# Patient Record
Sex: Female | Born: 1965 | Race: Black or African American | Hispanic: No | Marital: Single | Smoking: Former smoker
Health system: Southern US, Community
[De-identification: ages and names within clinical notes are randomized; demographics above are authoritative.]

## PROBLEM LIST (undated history)

## (undated) DIAGNOSIS — F32A Depression, unspecified: Secondary | ICD-10-CM

## (undated) DIAGNOSIS — D649 Anemia, unspecified: Secondary | ICD-10-CM

## (undated) DIAGNOSIS — L309 Dermatitis, unspecified: Secondary | ICD-10-CM

## (undated) DIAGNOSIS — I639 Cerebral infarction, unspecified: Secondary | ICD-10-CM

## (undated) DIAGNOSIS — F329 Major depressive disorder, single episode, unspecified: Secondary | ICD-10-CM

## (undated) DIAGNOSIS — I10 Essential (primary) hypertension: Secondary | ICD-10-CM

## (undated) DIAGNOSIS — L409 Psoriasis, unspecified: Secondary | ICD-10-CM

## (undated) DIAGNOSIS — K219 Gastro-esophageal reflux disease without esophagitis: Secondary | ICD-10-CM

## (undated) DIAGNOSIS — E785 Hyperlipidemia, unspecified: Secondary | ICD-10-CM

## (undated) HISTORY — DX: Anemia, unspecified: D64.9

## (undated) HISTORY — PX: CERVICAL CERCLAGE: SHX1329

## (undated) HISTORY — DX: Hyperlipidemia, unspecified: E78.5

## (undated) HISTORY — DX: Major depressive disorder, single episode, unspecified: F32.9

## (undated) HISTORY — DX: Depression, unspecified: F32.A

## (undated) HISTORY — DX: Cerebral infarction, unspecified: I63.9

## (undated) HISTORY — DX: Psoriasis, unspecified: L40.9

## (undated) HISTORY — DX: Dermatitis, unspecified: L30.9

## (undated) HISTORY — DX: Gastro-esophageal reflux disease without esophagitis: K21.9

## (undated) HISTORY — DX: Essential (primary) hypertension: I10

---

## 1998-03-21 ENCOUNTER — Emergency Department (HOSPITAL_COMMUNITY): Admission: EM | Admit: 1998-03-21 | Discharge: 1998-03-21 | Payer: Self-pay | Admitting: Emergency Medicine

## 1998-05-10 ENCOUNTER — Emergency Department (HOSPITAL_COMMUNITY): Admission: EM | Admit: 1998-05-10 | Discharge: 1998-05-10 | Payer: Self-pay | Admitting: Emergency Medicine

## 1998-05-11 ENCOUNTER — Encounter: Payer: Self-pay | Admitting: Emergency Medicine

## 1998-05-11 ENCOUNTER — Ambulatory Visit (HOSPITAL_COMMUNITY): Admission: RE | Admit: 1998-05-11 | Discharge: 1998-05-11 | Payer: Self-pay | Admitting: Emergency Medicine

## 1998-08-10 ENCOUNTER — Other Ambulatory Visit: Admission: RE | Admit: 1998-08-10 | Discharge: 1998-08-10 | Payer: Self-pay | Admitting: Obstetrics

## 1998-12-28 ENCOUNTER — Emergency Department (HOSPITAL_COMMUNITY): Admission: EM | Admit: 1998-12-28 | Discharge: 1998-12-28 | Payer: Self-pay | Admitting: Emergency Medicine

## 2006-05-22 ENCOUNTER — Encounter: Admission: RE | Admit: 2006-05-22 | Discharge: 2006-05-22 | Payer: Self-pay | Admitting: Obstetrics and Gynecology

## 2007-01-21 ENCOUNTER — Ambulatory Visit: Payer: Self-pay | Admitting: Family Medicine

## 2007-01-21 LAB — CONVERTED CEMR LAB
Bilirubin Urine: NEGATIVE
Glucose, Urine, Semiquant: NEGATIVE
Nitrite: NEGATIVE
Specific Gravity, Urine: 1.02
Urobilinogen, UA: 0.2

## 2007-02-05 ENCOUNTER — Ambulatory Visit: Payer: Self-pay | Admitting: Family Medicine

## 2007-02-05 ENCOUNTER — Encounter: Payer: Self-pay | Admitting: Family Medicine

## 2007-02-05 LAB — CONVERTED CEMR LAB
AST: 14 units/L (ref 0–37)
Albumin: 3.8 g/dL (ref 3.5–5.2)
Basophils Relative: 1 % (ref 0–1)
Chloride: 103 meq/L (ref 96–112)
Creatinine, Ser: 0.63 mg/dL (ref 0.40–1.20)
Eosinophils Relative: 1 % (ref 0–5)
HDL: 48 mg/dL (ref >39)
Hemoglobin: 9.2 g/dL — ABNORMAL LOW (ref 12.0–15.0)
LDL Cholesterol: 100 mg/dL — ABNORMAL HIGH (ref 0–99)
Lymphocytes Relative: 38 % (ref 12–46)
MCV: 77.2 fL — ABNORMAL LOW (ref 78.0–100.0)
Monocytes Absolute: 0.5 10*3/uL (ref 0.1–1.0)
Monocytes Relative: 7 % (ref 3–12)
Neutro Abs: 3.3 10*3/uL (ref 1.7–7.7)
Neutrophils Relative %: 53 % (ref 43–77)
Sodium: 135 meq/L (ref 135–145)
Total Bilirubin: 0.2 mg/dL — ABNORMAL LOW (ref 0.3–1.2)
Total CHOL/HDL Ratio: 3.3

## 2007-02-11 ENCOUNTER — Ambulatory Visit: Payer: Self-pay | Admitting: Family Medicine

## 2007-02-11 DIAGNOSIS — E663 Overweight: Secondary | ICD-10-CM | POA: Insufficient documentation

## 2007-02-11 DIAGNOSIS — D509 Iron deficiency anemia, unspecified: Secondary | ICD-10-CM | POA: Insufficient documentation

## 2007-11-18 ENCOUNTER — Ambulatory Visit (HOSPITAL_COMMUNITY): Admission: RE | Admit: 2007-11-18 | Discharge: 2007-11-18 | Payer: Self-pay | Admitting: Obstetrics and Gynecology

## 2007-11-18 ENCOUNTER — Encounter (HOSPITAL_COMMUNITY): Payer: Self-pay | Admitting: Obstetrics and Gynecology

## 2008-02-02 ENCOUNTER — Encounter: Admission: RE | Admit: 2008-02-02 | Discharge: 2008-02-02 | Payer: Self-pay | Admitting: Obstetrics and Gynecology

## 2008-06-14 ENCOUNTER — Encounter: Payer: Self-pay | Admitting: Family Medicine

## 2008-06-16 ENCOUNTER — Encounter: Payer: Self-pay | Admitting: Family Medicine

## 2008-07-01 ENCOUNTER — Encounter: Payer: Self-pay | Admitting: Family Medicine

## 2008-07-01 ENCOUNTER — Ambulatory Visit: Payer: Self-pay | Admitting: Family Medicine

## 2008-07-01 LAB — CONVERTED CEMR LAB
Nitrite: POSITIVE
Protein, U semiquant: 100
pH: 5.5

## 2008-07-06 LAB — CONVERTED CEMR LAB
Basophils Absolute: 0 10*3/uL (ref 0.0–0.1)
Basophils Relative: 0 % (ref 0–1)
Eosinophils Absolute: 0.2 10*3/uL (ref 0.0–0.7)
HCT: 31.5 % — ABNORMAL LOW (ref 36.0–46.0)
Hemoglobin: 9.8 g/dL — ABNORMAL LOW (ref 12.0–15.0)
Lymphocytes Relative: 25 % (ref 12–46)
Lymphs Abs: 2.6 10*3/uL (ref 0.7–4.0)
MCHC: 31.1 g/dL (ref 30.0–36.0)
Neutrophils Relative %: 65 % (ref 43–77)
RBC: 3.79 M/uL — ABNORMAL LOW (ref 3.87–5.11)
WBC: 10.5 10*3/uL (ref 4.0–10.5)

## 2008-07-22 ENCOUNTER — Emergency Department (HOSPITAL_COMMUNITY): Admission: EM | Admit: 2008-07-22 | Discharge: 2008-07-22 | Payer: Self-pay | Admitting: Emergency Medicine

## 2008-08-24 ENCOUNTER — Telehealth: Payer: Self-pay | Admitting: Family Medicine

## 2008-08-30 ENCOUNTER — Ambulatory Visit: Payer: Self-pay | Admitting: Family Medicine

## 2008-08-30 ENCOUNTER — Encounter: Payer: Self-pay | Admitting: Family Medicine

## 2008-08-30 DIAGNOSIS — F4323 Adjustment disorder with mixed anxiety and depressed mood: Secondary | ICD-10-CM | POA: Insufficient documentation

## 2008-09-21 ENCOUNTER — Telehealth: Payer: Self-pay | Admitting: Family Medicine

## 2008-09-30 ENCOUNTER — Ambulatory Visit: Payer: Self-pay | Admitting: Family Medicine

## 2008-10-05 ENCOUNTER — Encounter: Payer: Self-pay | Admitting: Family Medicine

## 2008-10-05 DIAGNOSIS — F331 Major depressive disorder, recurrent, moderate: Secondary | ICD-10-CM

## 2008-10-07 ENCOUNTER — Encounter: Payer: Self-pay | Admitting: Family Medicine

## 2008-10-13 ENCOUNTER — Ambulatory Visit: Payer: Self-pay | Admitting: Family Medicine

## 2008-11-10 ENCOUNTER — Ambulatory Visit: Payer: Self-pay | Admitting: Family Medicine

## 2009-02-07 ENCOUNTER — Encounter: Payer: Self-pay | Admitting: Family Medicine

## 2009-02-07 ENCOUNTER — Ambulatory Visit: Payer: Self-pay | Admitting: Family Medicine

## 2009-02-07 LAB — CONVERTED CEMR LAB

## 2009-02-08 ENCOUNTER — Telehealth (INDEPENDENT_AMBULATORY_CARE_PROVIDER_SITE_OTHER): Payer: Self-pay | Admitting: *Deleted

## 2009-02-09 ENCOUNTER — Encounter (INDEPENDENT_AMBULATORY_CARE_PROVIDER_SITE_OTHER): Payer: Self-pay | Admitting: *Deleted

## 2009-02-09 ENCOUNTER — Encounter: Payer: Self-pay | Admitting: *Deleted

## 2009-02-14 ENCOUNTER — Encounter (INDEPENDENT_AMBULATORY_CARE_PROVIDER_SITE_OTHER): Payer: Self-pay | Admitting: *Deleted

## 2009-02-14 DIAGNOSIS — F17200 Nicotine dependence, unspecified, uncomplicated: Secondary | ICD-10-CM | POA: Insufficient documentation

## 2009-03-25 DIAGNOSIS — I639 Cerebral infarction, unspecified: Secondary | ICD-10-CM

## 2009-03-25 HISTORY — DX: Cerebral infarction, unspecified: I63.9

## 2009-05-09 ENCOUNTER — Encounter: Payer: Self-pay | Admitting: Family Medicine

## 2009-05-09 ENCOUNTER — Ambulatory Visit: Payer: Self-pay | Admitting: Family Medicine

## 2009-05-09 DIAGNOSIS — F39 Unspecified mood [affective] disorder: Secondary | ICD-10-CM | POA: Insufficient documentation

## 2009-06-14 ENCOUNTER — Inpatient Hospital Stay (HOSPITAL_COMMUNITY): Admission: EM | Admit: 2009-06-14 | Discharge: 2009-06-16 | Payer: Self-pay | Admitting: Emergency Medicine

## 2009-06-15 ENCOUNTER — Encounter (INDEPENDENT_AMBULATORY_CARE_PROVIDER_SITE_OTHER): Payer: Self-pay | Admitting: Diagnostic Neuroimaging

## 2009-06-19 ENCOUNTER — Telehealth (INDEPENDENT_AMBULATORY_CARE_PROVIDER_SITE_OTHER): Payer: Self-pay | Admitting: Family Medicine

## 2009-06-26 ENCOUNTER — Encounter: Admission: RE | Admit: 2009-06-26 | Discharge: 2009-07-07 | Payer: Self-pay | Admitting: Neurology

## 2009-07-06 ENCOUNTER — Ambulatory Visit: Payer: Self-pay | Admitting: Family Medicine

## 2009-07-06 DIAGNOSIS — Z8673 Personal history of transient ischemic attack (TIA), and cerebral infarction without residual deficits: Secondary | ICD-10-CM | POA: Insufficient documentation

## 2009-08-16 ENCOUNTER — Encounter: Admission: RE | Admit: 2009-08-16 | Discharge: 2009-11-14 | Payer: Self-pay | Admitting: Neurology

## 2009-11-16 ENCOUNTER — Encounter: Payer: Self-pay | Admitting: *Deleted

## 2009-11-16 ENCOUNTER — Ambulatory Visit: Payer: Self-pay | Admitting: Family Medicine

## 2010-02-16 ENCOUNTER — Encounter: Admission: RE | Admit: 2010-02-16 | Discharge: 2010-02-16 | Payer: Self-pay | Admitting: Obstetrics and Gynecology

## 2010-02-21 ENCOUNTER — Emergency Department (HOSPITAL_COMMUNITY)
Admission: EM | Admit: 2010-02-21 | Discharge: 2010-02-21 | Payer: Self-pay | Source: Home / Self Care | Admitting: Occupational Therapy

## 2010-04-15 ENCOUNTER — Encounter (HOSPITAL_COMMUNITY): Payer: Self-pay | Admitting: Obstetrics and Gynecology

## 2010-04-24 NOTE — Letter (Signed)
Summary: Generic Letter  [facility]  [address]   [place], [place] [address]   Phone: [phone]  Fax: [phone]    11/16/2009  [facility] [address] [place], [place]  [address]   To Whom This May Concern:       The above named patient presented to our office today to receive immunizations that are required for the school she will be attending. However, she is currently taking a medication that is contraindicated in receiving MMR. We have ask her to discuss this with  the specialist   that ordered this medication regarding the safety of her reciving MMR. She has an appointment with this specialist November 28, 2009.            Sincerely,   Theresia Lo RN

## 2010-04-24 NOTE — Assessment & Plan Note (Signed)
Summary: hospital f/u   Vital Signs:  Patient profile:   45 year old female Height:      64 inches Weight:      151.8 pounds BMI:     26.15 Temp:     98.4 degrees F oral Pulse rate:   85 / minute BP sitting:   132 / 82  (left arm) Cuff size:   regular  Vitals Entered By: Gladstone Pih (July 06, 2009 2:48 PM) CC: [facility]U Hospital Is Patient Diabetic? Yes Did you bring your meter with you today? No   Primary Care Provider:  Marisue Ivan, MD  CC:  [facility].  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  States that she has f/u with Dr. Pearlean Brownie on 5/31.  Denies any further symptoms but still has some weakness on the right side.    Habits & Providers  Alcohol-Tobacco-Diet     Tobacco Status: never  Current Medications (verified): 1)  Enbrel 50 Mg/ml Soln (Etanercept) 2)  Remeron 15 Mg Tabs (Mirtazapine) .... One Tablet By Mouth Daily 3)  Simvastatin 20 Mg Tabs (Simvastatin) .Marland Kitchen.. 1 Tab By Mouth At Bedtime 4)  Bayer Aspirin 325 Mg Tabs (Aspirin) .Marland Kitchen.. 1 Tab By Mouth Daily  Allergies (verified): No Known Drug Allergies  Social History: Smoking Status:  never  Review of Systems      See HPI  Physical Exam  General:  VS Reviewed. Well appearing, NAD.  Head:  no facial droop Eyes:  EOMI PERRLA Neck:  supple, full ROM, no goiter or mass  Lungs:  Normal respiratory effort, chest expands symmetrically. Lungs are clear to auscultation, no crackles or wheezes. Heart:  Normal rate and regular rhythm. S1 and S2 normal without gallop, murmur, click, rub or other extra sounds. Neurologic:  5/5 strength in upper extremities 4/5 strength in plantar flexion on right compared to left sensation grossly intact 2+ DTRs nl speech nl gait   Impression & Recommendations:  Problem # 1:  CVA (ICD-434.91) Assessment  Improved  s/p L medial thalamic infarct seen on MRI Minimal neuro deficits focal to R plantar flexion  Risk stratification completed during hospital stay Agree with statin and ASA Pt has stopped smoking Will have her return to the clinic for nurse visit to reassess her bp  Her updated medication list for this problem includes:    Bayer Aspirin 325 Mg Tabs (Aspirin) .Marland Kitchen... 1 tab by mouth daily  Orders: [facility]- Est Level  3 (03474)  Complete Medication List: 1)  Enbrel 50 Mg/ml Soln (Etanercept) 2)  Remeron 15 Mg Tabs (Mirtazapine) .... One tablet by mouth daily 3)  Simvastatin 20 Mg Tabs (Simvastatin) .Marland Kitchen.. 1 tab by mouth at bedtime 4)  Bayer Aspirin 325 Mg Tabs (Aspirin) .Marland Kitchen.. 1 tab by mouth daily  Patient Instructions: 1)  I want you to return at your convenience for a nurse visit to check and record your blood pressure.   2)  No changes to your medication at this time.

## 2010-04-24 NOTE — Assessment & Plan Note (Signed)
Summary: shots for college,tcb  Nurse Visit patient in office today to receive immunizations for school. 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  after going over screening questionaire it was determined that patient is currently taking Enbrel. Since this medication suppresses the immune system it is recommended that she not receive MMR today until she has discussed with her dermatologist about the degree of immune suppression. I contacted the [facility] on call nurse and this is the information I was given.  also consulted with Dr. Mauricio Po. Tdap given and letter given to patient to take to her school regarding MMR .  Theresia Lo RN  November 16, 2009 2:38 PM    Vital Signs:  Patient profile:   44 year old female Temp:     98.8 degrees F  Vitals Entered By: Theresia Lo RN (November 16, 2009 2:33 PM)  Allergies: No Known Drug Allergies  Orders Added: 1)  Admin 1st Vaccine [facility]) [90471S]  Appended Document: shots for college,tcb   Tetanus/Td Vaccine    Vaccine Type: Tdap (State)    Site: right deltoid    Mfr: GlaxoSmithKline    Dose: 0.5 ml    Route: IM    Given by: Theresia Lo RN    Exp. Date: 12/10/2010    Lot #: [id number]    VIS given: 02/10/07 version given November 16, 2009.

## 2010-04-24 NOTE — Progress Notes (Signed)
Summary: phn msg  Phone Note Call from Patient Call back at [phone]   Caller: Patient Summary of Call: had a stroke last week and wants to know if he could fill out disability forms?  pls call call  Initial call taken by: De Nurse,  June 19, 2009 10:02 AM  Follow-up for Phone Call        Plan to see her for f/u appt and she can bring the appropriate forms.  I'll be glad to review the forms at that time. Follow-up by: Marisue Ivan  MD,  June 26, 2009 5:48 PM  Additional Follow-up for Phone Call Additional follow up Details #1::        LVM about making f/up appt with Dr. Burnadette Pop and he will fill out papers at that time. Additional Follow-up by: Clydell Hakim,  June 26, 2009 6:07 PM

## 2010-04-24 NOTE — Assessment & Plan Note (Signed)
Summary: Mood instability- psych referral   Vital Signs:  Patient profile:   45 year old female Height:      64 inches Weight:      143.5 pounds BMI:     24.72 Temp:     98.0 degrees F oral Pulse rate:   76 / minute BP sitting:   135 / 84  (left arm) Cuff size:   regular  Vitals Entered By: Gladstone Pih (May 09, 2009 11:08 AM) CC: f/u on depression/anxiety Is Patient Diabetic? No Pain Assessment Patient in pain? no        Primary Care Provider:  Marisue Ivan, MD  CC:  f/u on depression/anxiety.  History of Present Illness: 45yo F here for f/u depression /anxiety  Anxiety/Depression: Pt states she is no longer responding to the medication as she had been in 10/2008.  States that she is more depressed and apathetic now.  Has lost weight.  States that her mood is not stable.  She has moments of spontaneous crying and then will be elated at times.  Denies any shopping sprees or staying up for extended periods of time.  No family hx of bipolar disorder.  Pt has many stressors in life with family, work, and finances.  No SI/HI.  Although she had a plan weeks ago to sit in a running car in an enclosed garage but did not go through with it.  Habits & Providers  Alcohol-Tobacco-Diet     Tobacco Status: current     Tobacco Counseling: to quit use of tobacco products     Cigarette Packs/Day: 0.5  Current Medications (verified): 1)  Ferrous Sulfate 325 (65 Fe) Mg  Tbec (Ferrous Sulfate) .... One Tablet By Mouth Twice Daily 2)  Enbrel 50 Mg/ml Soln (Etanercept) 3)  Remeron 15 Mg Tabs (Mirtazapine) .... One Tablet By Mouth Daily  Allergies (verified): No Known Drug Allergies  Social History: Packs/Day:  0.5  Review of Systems       no fevers, headaches, dizziness, cp, sob  Physical Exam  General:  VS Reviewed. Well appearing, NAD.  Psych:  Tearful, unstable mood. Not anxious appearing. Good eye contact. Appropriate affect. No SI/HI.   Impression &  Recommendations:  Problem # 1:  MOOD SWINGS (ICD-296.99) Assessment New Today's evaluation is more c/w mood instability than typical anxiety/depression.  She has no family hx of Bipoloar disorder and has never been dx or treated for mood instability.   She has been treated with Celexa, Fluoxetine, and currently on Remeron which she initially responded well to during Aug 2010. Plan to keep her on Remeron at this time. She is not suicidal or homicidal and has no active plan or intentions. I want to refer her to Dr. Emerson Monte [phone]) for further evaluation and recommendations. If she can't be seen by Dr. Nolen Mu within 4 weeks, I will want her to see me in the office in 2 weeks to reassess. Plan to call the patient per Gladstone Pih this afternoon after calling Dr. Loralie Champagne office. Best contact # for [place] is [phone] (cell). Face to face time was >40 minutes.  Orders: Psychiatric Referral (Psych) [facility]- Est  Level 4 ([address]147)  Complete Medication List: 1)  Ferrous Sulfate 325 (65 Fe) Mg Tbec (Ferrous sulfate) .... One tablet by mouth twice daily 2)  Enbrel 50 Mg/ml Soln (Etanercept) 3)  Remeron 15 Mg Tabs (Mirtazapine) .... One tablet by mouth daily  Other Orders: [facility] ([id number])  Patient Instructions: 1)  We will call you  with the appt with Dr. Nolen Mu. 2)  Continue with the Remeron.  3)  I'll talk to you about seeing me earlier if you can't get in with Dr. Nolen Mu. Prescriptions: REMERON 15 MG TABS (MIRTAZAPINE) one tablet by mouth daily  #30 x 2   Entered and Authorized by:   Marisue Ivan  MD   Signed by:   Marisue Ivan  MD on 05/09/2009   Method used:   Electronically to        [facility] [address] [phone][facility] (retail)       [address]       [place], [place]  [address]       Ph: [phone] or [phone]       Fax: [phone]   RxID:   [id number]

## 2010-05-04 ENCOUNTER — Encounter: Payer: Self-pay | Admitting: *Deleted

## 2010-05-04 DIAGNOSIS — F331 Major depressive disorder, recurrent, moderate: Secondary | ICD-10-CM

## 2010-05-17 DIAGNOSIS — F411 Generalized anxiety disorder: Secondary | ICD-10-CM

## 2010-05-17 DIAGNOSIS — F331 Major depressive disorder, recurrent, moderate: Secondary | ICD-10-CM

## 2010-05-30 DIAGNOSIS — F4323 Adjustment disorder with mixed anxiety and depressed mood: Secondary | ICD-10-CM

## 2010-05-30 DIAGNOSIS — F39 Unspecified mood [affective] disorder: Secondary | ICD-10-CM

## 2010-06-05 LAB — BASIC METABOLIC PANEL
Calcium: 8.5 mg/dL (ref 8.4–10.5)
Chloride: 110 mEq/L (ref 96–112)
Creatinine, Ser: 0.55 mg/dL (ref 0.4–1.2)
GFR calc Af Amer: >60 mL/min (ref >60)
GFR calc non Af Amer: >60 mL/min (ref >60)
Potassium: 3.6 mEq/L (ref 3.5–5.1)

## 2010-06-05 LAB — DIFFERENTIAL
Eosinophils Absolute: 0.2 10*3/uL (ref 0.0–0.7)
Eosinophils Relative: 3 % (ref 0–5)
Lymphocytes Relative: 36 % (ref 12–46)
Lymphs Abs: 2.4 10*3/uL (ref 0.7–4.0)
Monocytes Absolute: 0.6 10*3/uL (ref 0.1–1.0)
Monocytes Relative: 10 % (ref 3–12)
Neutro Abs: 3.5 10*3/uL (ref 1.7–7.7)
Neutrophils Relative %: 52 % (ref 43–77)

## 2010-06-05 LAB — POCT CARDIAC MARKERS

## 2010-06-05 LAB — CBC
MCH: 26.2 pg (ref 26.0–34.0)
RBC: 3.78 MIL/uL — ABNORMAL LOW (ref 3.87–5.11)

## 2010-06-18 LAB — CBC
MCHC: 33.1 g/dL (ref 30.0–36.0)
MCV: 82.8 fL (ref 78.0–100.0)
Platelets: 190 10*3/uL (ref 150–400)
RBC: 3.67 MIL/uL — ABNORMAL LOW (ref 3.87–5.11)
RDW: 14.6 % (ref 11.5–15.5)

## 2010-06-18 LAB — POCT I-STAT, CHEM 8
Calcium, Ion: 1.05 mmol/L — ABNORMAL LOW (ref 1.12–1.32)
HCT: 33 % — ABNORMAL LOW (ref 36.0–46.0)
Hemoglobin: 11.2 g/dL — ABNORMAL LOW (ref 12.0–15.0)
Potassium: 3.5 mEq/L (ref 3.5–5.1)
Sodium: 138 mEq/L (ref 135–145)
TCO2: 24 mmol/L (ref 0–100)

## 2010-06-18 LAB — HOMOCYSTEINE: Homocysteine: 7.3 umol/L (ref 4.0–15.4)

## 2010-06-18 LAB — GLUCOSE, CAPILLARY
Glucose-Capillary: 109 mg/dL — ABNORMAL HIGH (ref 70–99)
Glucose-Capillary: 114 mg/dL — ABNORMAL HIGH (ref 70–99)
Glucose-Capillary: 75 mg/dL (ref 70–99)
Glucose-Capillary: 85 mg/dL (ref 70–99)
Glucose-Capillary: 92 mg/dL (ref 70–99)

## 2010-06-18 LAB — FACTOR 5 LEIDEN

## 2010-06-18 LAB — PROTIME-INR
INR: 1.03 (ref 0.00–1.49)
Prothrombin Time: 13.4 seconds (ref 11.6–15.2)

## 2010-06-18 LAB — POCT CARDIAC MARKERS
CKMB, poc: <1 ng/mL — ABNORMAL LOW (ref 1.0–8.0)
Myoglobin, poc: 31.2 ng/mL (ref 12–200)

## 2010-06-18 LAB — DIFFERENTIAL
Basophils Absolute: 0 10*3/uL (ref 0.0–0.1)
Basophils Relative: 1 % (ref 0–1)
Eosinophils Absolute: 0.2 10*3/uL (ref 0.0–0.7)
Monocytes Relative: 10 % (ref 3–12)
Neutro Abs: 3.3 10*3/uL (ref 1.7–7.7)
Neutrophils Relative %: 49 % (ref 43–77)

## 2010-06-18 LAB — HEMOGLOBIN A1C: Mean Plasma Glucose: 111 mg/dL

## 2010-06-18 LAB — LIPID PANEL: VLDL: 28 mg/dL (ref 0–40)

## 2010-06-18 LAB — COMPREHENSIVE METABOLIC PANEL
ALT: 15 U/L (ref 0–35)
CO2: 23 mEq/L (ref 19–32)
Calcium: 7.9 mg/dL — ABNORMAL LOW (ref 8.4–10.5)
Chloride: 107 mEq/L (ref 96–112)
GFR calc non Af Amer: >60 mL/min (ref >60)
Glucose, Bld: 158 mg/dL — ABNORMAL HIGH (ref 70–99)
Sodium: 134 mEq/L — ABNORMAL LOW (ref 135–145)
Total Bilirubin: 0.3 mg/dL (ref 0.3–1.2)

## 2010-06-18 LAB — CARDIOLIPIN ANTIBODIES, IGG, IGM, IGA
Anticardiolipin IgA: 0 APL U/mL — ABNORMAL LOW (ref <22)
Anticardiolipin IgG: 6 GPL U/mL — ABNORMAL LOW (ref <23)

## 2010-06-18 LAB — PROTEIN C, TOTAL: Protein C, Total: 95 % (ref 70–140)

## 2010-06-18 LAB — LACTIC ACID, PLASMA: Lactic Acid, Venous: 2.2 mmol/L (ref 0.5–2.2)

## 2010-06-18 LAB — RAPID URINE DRUG SCREEN, HOSP PERFORMED
Amphetamines: NOT DETECTED
Barbiturates: NOT DETECTED
Benzodiazepines: NOT DETECTED

## 2010-06-18 LAB — PROTEIN S ACTIVITY: Protein S Activity: 68 % — ABNORMAL LOW (ref 69–129)

## 2010-06-18 LAB — HEPATIC FUNCTION PANEL
AST: 20 U/L (ref 0–37)
Albumin: 3 g/dL — ABNORMAL LOW (ref 3.5–5.2)
Total Protein: 6.4 g/dL (ref 6.0–8.3)

## 2010-06-18 LAB — PROTHROMBIN GENE MUTATION

## 2010-06-18 LAB — APTT: aPTT: 29 seconds (ref 24–37)

## 2010-06-18 LAB — PROTEIN C ACTIVITY: Protein C Activity: 133 % (ref 75–133)

## 2010-06-18 LAB — LUPUS ANTICOAGULANT PANEL
DRVVT: 42 secs (ref 36.2–44.3)
Lupus Anticoagulant: NOT DETECTED
PTT Lupus Anticoagulant: 41.8 secs (ref 32.0–43.4)

## 2010-06-18 LAB — CK TOTAL AND CKMB (NOT AT ARMC)
CK, MB: 0.5 ng/mL (ref 0.3–4.0)
Total CK: 59 U/L (ref 7–177)

## 2010-06-18 LAB — BETA-2-GLYCOPROTEIN I ABS, IGG/M/A: Beta-2 Glyco I IgG: 1 G Units (ref <20)

## 2010-06-21 DIAGNOSIS — F411 Generalized anxiety disorder: Secondary | ICD-10-CM

## 2010-06-21 DIAGNOSIS — F341 Dysthymic disorder: Secondary | ICD-10-CM

## 2010-07-23 ENCOUNTER — Ambulatory Visit: Payer: Self-pay | Admitting: Family Medicine

## 2010-08-07 NOTE — Op Note (Signed)
NAME:  Ashley Lewis, Ashley Lewis            ACCOUNT NO.:  [id number]   MEDICAL RECORD NO.:  [id number]          PATIENT TYPE:  AMB   LOCATION:  SDC                           FACILITY:  [facility]   PHYSICIAN:  Zelphia Cairo, MD    DATE OF BIRTH:  09-20-1965   DATE OF PROCEDURE:  DATE OF DISCHARGE:                               OPERATIVE REPORT   PREOPERATIVE DIAGNOSES:  1. Dysmenorrhea and menorrhagia.  2. Endometrial mass.  3. Fibroids.   POSTOPERATIVE DIAGNOSES:  1. Dysmenorrhea and menorrhagia.  2. Endometrial mass.  3. Fibroids, path pending.   PROCEDURES:  Hysteroscopy, D and C.   SURGEON:  Zelphia Cairo, MD   ANESTHESIA:  General.   FINDINGS:  Multiple polypoid masses in the posterior uterine wall.   SPECIMEN:  Endometrial curettings with polyps to pathology.   ESTIMATED BLOOD LOSS:  Minimal.   COMPLICATIONS:  None.   CONDITION:  Stable to recovery room.   PROCEDURE:  The patient was taken to the operating room where a general  anesthesia was obtained.  She was placed in the dorsal lithotomy  position using Allen stirrups.  She was prepped and draped in a sterile  fashion and a catheter was used to sterilely drain her bladder for  approximately 50 mL of clear urine.  Bivalve speculum was placed in the  vagina and a single-tooth tenaculum on the anterior lip of the cervix.  The cervix was serially dilated using Pratt dilators.  Hysteroscope was  then inserted and survey of the uterine cavity was performed.  Bilateral  ostia were identified and found to be normal.  There were multiple  fluffy polypoid masses of the posterior uterus.  The remainder of the  uterus appeared smooth and atrophic.  Hysteroscope was removed and a  gentle curetting was then performed.  Hysteroscope was reinserted.  There was still small polypoid mass on the left posterior wall.  A  repeat  curetting was performed.  Specimens were placed on Telfa and sent off to  pathology.  Hysteroscope was  reinserted.  No further polypoid masses  were noted.  The patient was extubated and taken to the recovery room in  stable condition.  Sponge, lap, needle, and instrument counts were  correct times 2.      Zelphia Cairo, MD  Electronically Signed     GA/MEDQ  D:  11/18/2007  T:  11/19/2007  Job:  [id number]

## 2010-08-17 DIAGNOSIS — F330 Major depressive disorder, recurrent, mild: Secondary | ICD-10-CM

## 2010-08-17 DIAGNOSIS — F411 Generalized anxiety disorder: Secondary | ICD-10-CM

## 2010-09-13 ENCOUNTER — Other Ambulatory Visit: Payer: Self-pay | Admitting: Dermatology

## 2010-09-18 DIAGNOSIS — F331 Major depressive disorder, recurrent, moderate: Secondary | ICD-10-CM

## 2010-09-18 DIAGNOSIS — F411 Generalized anxiety disorder: Secondary | ICD-10-CM

## 2010-10-05 ENCOUNTER — Other Ambulatory Visit (HOSPITAL_COMMUNITY)
Admission: RE | Admit: 2010-10-05 | Discharge: 2010-10-05 | Disposition: A | Discharge status: Home / Self Care | Payer: Managed Care, Other (non HMO) | Source: Ambulatory Visit | Attending: Family Medicine | Admitting: Family Medicine

## 2010-10-05 ENCOUNTER — Other Ambulatory Visit: Payer: Self-pay | Admitting: Family Medicine

## 2010-10-05 DIAGNOSIS — Z124 Encounter for screening for malignant neoplasm of cervix: Secondary | ICD-10-CM | POA: Insufficient documentation

## 2010-10-05 DIAGNOSIS — Z113 Encounter for screening for infections with a predominantly sexual mode of transmission: Secondary | ICD-10-CM | POA: Insufficient documentation

## 2011-04-26 ENCOUNTER — Ambulatory Visit: Payer: Managed Care, Other (non HMO) | Admitting: Family Medicine

## 2011-06-04 ENCOUNTER — Other Ambulatory Visit: Payer: Self-pay | Admitting: Obstetrics and Gynecology

## 2011-06-26 ENCOUNTER — Encounter (HOSPITAL_COMMUNITY): Payer: Self-pay | Admitting: Pharmacist

## 2011-07-09 ENCOUNTER — Inpatient Hospital Stay (HOSPITAL_COMMUNITY): Admission: RE | Admit: 2011-07-09 | Payer: Managed Care, Other (non HMO) | Source: Ambulatory Visit

## 2011-09-17 ENCOUNTER — Encounter (HOSPITAL_COMMUNITY): Admission: RE | Payer: Self-pay | Source: Ambulatory Visit

## 2011-09-17 ENCOUNTER — Ambulatory Visit (HOSPITAL_COMMUNITY)
Admission: RE | Admit: 2011-09-17 | Payer: Managed Care, Other (non HMO) | Source: Ambulatory Visit | Admitting: Obstetrics and Gynecology

## 2011-09-17 SURGERY — HYSTERECTOMY, VAGINAL, LAPAROSCOPY-ASSISTED
Anesthesia: General

## 2012-08-12 ENCOUNTER — Ambulatory Visit (INDEPENDENT_AMBULATORY_CARE_PROVIDER_SITE_OTHER): Payer: Medicaid Other | Admitting: Family Medicine

## 2012-08-12 ENCOUNTER — Encounter: Payer: Self-pay | Admitting: Family Medicine

## 2012-08-12 ENCOUNTER — Other Ambulatory Visit (HOSPITAL_COMMUNITY)
Admission: RE | Admit: 2012-08-12 | Discharge: 2012-08-12 | Disposition: A | Discharge status: Home / Self Care | Payer: Medicaid Other | Source: Ambulatory Visit | Attending: Family Medicine | Admitting: Family Medicine

## 2012-08-12 VITALS — BP 150/100 | HR 89 | Temp 99.1°F | Ht 64.0 in | Wt 168.0 lb

## 2012-08-12 DIAGNOSIS — D509 Iron deficiency anemia, unspecified: Secondary | ICD-10-CM

## 2012-08-12 DIAGNOSIS — F331 Major depressive disorder, recurrent, moderate: Secondary | ICD-10-CM

## 2012-08-12 DIAGNOSIS — N898 Other specified noninflammatory disorders of vagina: Secondary | ICD-10-CM

## 2012-08-12 DIAGNOSIS — I6350 Cerebral infarction due to unspecified occlusion or stenosis of unspecified cerebral artery: Secondary | ICD-10-CM

## 2012-08-12 DIAGNOSIS — Z9189 Other specified personal risk factors, not elsewhere classified: Secondary | ICD-10-CM

## 2012-08-12 DIAGNOSIS — Z202 Contact with and (suspected) exposure to infections with a predominantly sexual mode of transmission: Secondary | ICD-10-CM

## 2012-08-12 DIAGNOSIS — L408 Other psoriasis: Secondary | ICD-10-CM

## 2012-08-12 DIAGNOSIS — N912 Amenorrhea, unspecified: Secondary | ICD-10-CM | POA: Insufficient documentation

## 2012-08-12 DIAGNOSIS — M7989 Other specified soft tissue disorders: Secondary | ICD-10-CM

## 2012-08-12 DIAGNOSIS — Z113 Encounter for screening for infections with a predominantly sexual mode of transmission: Secondary | ICD-10-CM | POA: Insufficient documentation

## 2012-08-12 DIAGNOSIS — I10 Essential (primary) hypertension: Secondary | ICD-10-CM

## 2012-08-12 DIAGNOSIS — F482 Pseudobulbar affect: Secondary | ICD-10-CM

## 2012-08-12 HISTORY — DX: Amenorrhea, unspecified: N91.2

## 2012-08-12 LAB — COMPREHENSIVE METABOLIC PANEL
ALT: 15 U/L (ref 0–35)
AST: 18 U/L (ref 0–37)
Albumin: 4.1 g/dL (ref 3.5–5.2)
Alkaline Phosphatase: 51 U/L (ref 39–117)
Chloride: 104 mEq/L (ref 96–112)
Potassium: 3.9 mEq/L (ref 3.5–5.3)
Sodium: 137 mEq/L (ref 135–145)
Total Protein: 8 g/dL (ref 6.0–8.3)

## 2012-08-12 LAB — CBC
MCH: 26.3 pg (ref 26.0–34.0)
MCHC: 32 g/dL (ref 30.0–36.0)
MCV: 82.3 fL (ref 78.0–100.0)
Platelets: 230 10*3/uL (ref 150–400)
RBC: 4.63 MIL/uL (ref 3.87–5.11)
RDW: 14.7 % (ref 11.5–15.5)

## 2012-08-12 LAB — POCT WET PREP (WET MOUNT)

## 2012-08-12 LAB — POCT URINE PREGNANCY: Preg Test, Ur: NEGATIVE

## 2012-08-12 LAB — LIPID PANEL: Total CHOL/HDL Ratio: 4.3 Ratio

## 2012-08-12 MED ORDER — ASPIRIN 81 MG PO TABS: 81.0000 mg | ORAL_TABLET | Freq: Every day | ORAL | Status: DC

## 2012-08-12 MED ORDER — HYDROCHLOROTHIAZIDE 12.5 MG PO TABS: 25.0000 mg | ORAL_TABLET | Freq: Every day | ORAL | Status: DC

## 2012-08-12 MED ORDER — TRIAMCINOLONE ACETONIDE 0.025 % EX OINT: TOPICAL_OINTMENT | Freq: Two times a day (BID) | CUTANEOUS | Status: DC

## 2012-08-12 MED ORDER — OMEPRAZOLE 20 MG PO CPDR: 20.0000 mg | DELAYED_RELEASE_CAPSULE | Freq: Every day | ORAL | Status: DC

## 2012-08-12 NOTE — Assessment & Plan Note (Signed)
Previously with menorrhagia and had canceled plans for hysterectomy due to fibroids, now 6 months of amenorrhea. Will check LH/FSH, TSH, consider perimenopausal vs endocrine abnormality? Check CBC.

## 2012-08-12 NOTE — Assessment & Plan Note (Signed)
Patient plans to restart depakote, abilify and wellbutrin prescribed by Dr. Leigh Aurora in [facility].

## 2012-08-12 NOTE — Progress Notes (Signed)
Subjective:     Ashley Lewis is a 47 y.o. female and is here for a comprehensive physical exam. SHe has not been seen in past 3 years due to lack of insurance, and wants to get checked out. Here to re-establish today.   Her chief complaint is: Feels like something is going wrong in her body. The patient reports problems - Endorses worsens psoriasis, joint pain, muscle aches, headaches, weakness, numbness, sadness, anxiety, stress, memory problems, and swelling, abdominal pain,   1. Psoriasis. Previously was treated with Humira from her dermatologist. Has been off this medication for greater than one year. Has noted worsened itchy rash on her face and legs. She has it in her fingernails and toenails changes. She has pain in her ankles and hands and fingers. Has only been able to afford OTC hydrocortisone.  2. history of CVA 2011. In her left thalamus, had infarction. She reports not knowing of any cause. She did smoke at the time. She formerly was on a statin medication but cannot afford. She reports having some mood disorder and headache following the stroke.   3. Psychiatric/mood disorder. She previously saw mental health and did reestablish at [facility] in [facility] yesterday. She brings paperwork and prescriptions for Depakote and Abilify. Also was to restart her Wellbutrin and use Vistaril when necessary. 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  4. Hx of fibroid/menorrhagia/anemia. Her LMP was 6 months ago. She reports had canceled plans for hysterectomy from Dr. Renaldo Fiddler due to loss of insurance. Patient is not currently sexually active, did a preg test negative at home. Wonders if menopausal?   History   Social History  . Marital Status: Single    Spouse Name: N/A    Number of Children: N/A  . Years of Education: N/A   Occupational History  . Not on file.   Social History Main Topics  . Smoking status: Former  Smoker -- 0.50 packs/day    Start date: 03/25/2009    Quit date: 04/14/2010  . Smokeless tobacco: Not on file  . Alcohol Use: No  . Drug Use: Yes    Special: Marijuana  . Sexually Active: Yes   Other Topics Concern  . Not on file   Social History Narrative   Divorced, mother of 4 children, 3 still live with patient. Has 81 yo daughter with severe MR and special needs.    Does not work outside home. Working to try for disability, has lawyers currently.   Health Maintenance  Topic Date Due  . Influenza Vaccine  11/23/2012  . Pap Smear  10/10/2013  . Tetanus/tdap  11/17/2019    The following portions of the patient's history were reviewed and updated as appropriate: allergies, current medications, past family history, past medical history, past social history, past surgical history and problem list.  Review of Systems Pertinent items are noted in HPI.   Objective:    BP 169/93  Pulse 89  Temp(Src) 99.1 F (37.3 C) (Oral)  Ht 5\' 4"  (1.626 m)  Wt 168 lb (76.204 kg)  BMI 28.82 kg/m2 General appearance: alert, cooperative, appears stated age and no distress Head: Normocephalic, without obvious abnormality, atraumatic Eyes: conjunctivae/corneas clear. PERRL, EOM's intact.   Throat: lips, mucosa, and tongue normal; teeth and gums normal Neck: no adenopathy, supple, symmetrical, trachea midline and thyroid not enlarged, symmetric, no tenderness/mass/nodules Back: symmetric, no curvature. ROM normal. No CVA tenderness. Lungs: clear to auscultation bilaterally Breasts: right axilla has a  nontender fullness compared to left. No masses, nipple retraction, skin changes noted. Heart: regular rate and rhythm, S1, S2 normal, no murmur, click, rub or gallop Abdomen: soft, non-tender; bowel sounds normal; no masses,  no organomegaly Pelvic: cervix normal in appearance, external genitalia normal, no adnexal masses or tenderness, no cervical motion tenderness, rectovaginal septum normal and  white discharge noted. Extremities: extremities normal, atraumatic, no cyanosis or edema Skin: hyperpigmented, thickened circular areas about 0.5-1 cm in diameter - scattered, face, trunk, upper leg(s) bilateral, lower leg(s) bilateral. Right hand has pitted fingernails on 1 and 2nd digits. 4th digit TTP at DIP. Neurologic: Alert and oriented X 3, normal strength and tone. Normal symmetric reflexes. Normal coordination and gait    Assessment:    Healthy female exam. Psoriasis with likely psoriatic arthritis. Hx of CVA. Hypertension. Amenorrhea. Major depression.       Plan:     See After Visit Summary for Counseling Recommendations

## 2012-08-12 NOTE — Assessment & Plan Note (Signed)
Two values above goal. Has some borderline BP measurements in the past few years. Given her history of CVA, will start HCTZ. Check labs. Consider addition of ACEi pending lab results.

## 2012-08-12 NOTE — Assessment & Plan Note (Signed)
Discussed restarting risk modifications. Start aspirin. Check lipids, TSH. F/u in 2-3 weeks.

## 2012-08-12 NOTE — Assessment & Plan Note (Signed)
Uncontrolled, likely with psoriatic arthritis. Off of humira. With medicaid, dermatology referral maybe difficult. Could consider restarting DMARD therapy pending her lab tests. Given rx for topical steroid to start intially. Pt to f/u in clinic in 2-3 weeks.

## 2012-08-12 NOTE — Patient Instructions (Addendum)
Nice to meet you. Make an appointment in next 2-3 weeks to discuss your many lab results. Try steroid cream for your face and body. You should take a daily aspirin to prevent strokes.  Start blood pressure (hydrochlorothiazide) medication daily. Start omeprazole for acid reflux. Use protection if you decide to have sex. Will order a breast ultrasound and mammogram.

## 2012-08-13 ENCOUNTER — Telehealth: Payer: Self-pay | Admitting: Family Medicine

## 2012-08-13 LAB — FSH/LH
FSH: 80.7 m[IU]/mL
LH: 37.6 m[IU]/mL

## 2012-08-13 LAB — HIV ANTIBODY (ROUTINE TESTING W REFLEX): HIV: NONREACTIVE

## 2012-08-14 MED ORDER — METRONIDAZOLE 500 MG PO TABS: 500.0000 mg | ORAL_TABLET | Freq: Two times a day (BID) | ORAL | Status: DC

## 2012-08-14 NOTE — Telephone Encounter (Signed)
Pt notified per MD.  Radene Ou, CMA

## 2012-08-14 NOTE — Telephone Encounter (Signed)
Please inform patient all her labs looked normal. No sign of STD. Normal blood counts. Except she does have BV (bacterial overgrowth of vagina). I will send flagyl rx to take. She should avoid alcohol use with medication. We can discuss remainder of labs at her follow up visit.

## 2012-08-18 ENCOUNTER — Ambulatory Visit
Admission: RE | Admit: 2012-08-18 | Discharge: 2012-08-18 | Disposition: A | Discharge status: Home / Self Care | Payer: Medicaid Other | Source: Ambulatory Visit | Attending: Family Medicine | Admitting: Family Medicine

## 2012-08-18 DIAGNOSIS — M7989 Other specified soft tissue disorders: Secondary | ICD-10-CM

## 2012-08-26 ENCOUNTER — Other Ambulatory Visit: Payer: Medicaid Other

## 2012-09-02 ENCOUNTER — Ambulatory Visit (HOSPITAL_COMMUNITY)
Admission: RE | Admit: 2012-09-02 | Discharge: 2012-09-02 | Disposition: A | Discharge status: Home / Self Care | Payer: Medicaid Other | Source: Ambulatory Visit | Attending: Family Medicine | Admitting: Family Medicine

## 2012-09-02 ENCOUNTER — Encounter: Payer: Self-pay | Admitting: Family Medicine

## 2012-09-02 ENCOUNTER — Ambulatory Visit (INDEPENDENT_AMBULATORY_CARE_PROVIDER_SITE_OTHER): Payer: Medicaid Other | Admitting: Family Medicine

## 2012-09-02 VITALS — BP 110/72 | HR 109 | Ht 63.0 in | Wt 166.0 lb

## 2012-09-02 DIAGNOSIS — M79609 Pain in unspecified limb: Secondary | ICD-10-CM

## 2012-09-02 DIAGNOSIS — M542 Cervicalgia: Secondary | ICD-10-CM | POA: Insufficient documentation

## 2012-09-02 DIAGNOSIS — R079 Chest pain, unspecified: Secondary | ICD-10-CM

## 2012-09-02 DIAGNOSIS — M79602 Pain in left arm: Secondary | ICD-10-CM

## 2012-09-02 DIAGNOSIS — E785 Hyperlipidemia, unspecified: Secondary | ICD-10-CM

## 2012-09-02 DIAGNOSIS — I6350 Cerebral infarction due to unspecified occlusion or stenosis of unspecified cerebral artery: Secondary | ICD-10-CM

## 2012-09-02 DIAGNOSIS — N912 Amenorrhea, unspecified: Secondary | ICD-10-CM

## 2012-09-02 MED ORDER — CYCLOBENZAPRINE HCL 10 MG PO TABS: 10.0000 mg | ORAL_TABLET | Freq: Every evening | ORAL | Status: DC | PRN

## 2012-09-02 MED ORDER — IBUPROFEN 600 MG PO TABS: 600.0000 mg | ORAL_TABLET | Freq: Three times a day (TID) | ORAL | Status: DC | PRN

## 2012-09-02 MED ORDER — SIMVASTATIN 20 MG PO TABS: 20.0000 mg | ORAL_TABLET | Freq: Every day | ORAL | Status: DC

## 2012-09-02 MED ORDER — ATORVASTATIN CALCIUM 40 MG PO TABS: 40.0000 mg | ORAL_TABLET | Freq: Every day | ORAL | Status: DC

## 2012-09-02 NOTE — Assessment & Plan Note (Signed)
High potency statin prescribed. Continue daily aspirin

## 2012-09-02 NOTE — Assessment & Plan Note (Signed)
Hormonal tests indicate she is likely perimenopausal. Patient with a history of menorrhagia and fibroids, this seems to be a relief to her given that she may avoid a hysterectomy which was previously planned. Advise she continue using contraception at this time.

## 2012-09-02 NOTE — Assessment & Plan Note (Signed)
This is acute episodic left-sided neck and arm pain, and seemed atypical for cardiac etiology. No sign of arthritis today. Likely this could be a musculoskeletal pain similar to her right neck spasm seen on exam today. We did check an EKG to rule out any ischemia, given her history of CVA. We discussed use of Motrin or Flexeril when necessary for muscle spasm. Followup if symptoms return or fail to improve.  EKG: 85 bpm, There is a right bundle branch block,  No ST segment elevation, no previous to compare.

## 2012-09-02 NOTE — Progress Notes (Signed)
  Subjective:    Patient ID: Ashley Lewis, female    DOB: 26-May-1965, 47 y.o.   MRN: [id number]  HPI f/u lab results.  1. HLD. Patient previously on zocor due to hx of stroke.   2. Left sided neck/shoulder/arm pain. Patient notes on Sunday she had an acute episode of severe pain, seemed to start in her left neck. This was similar to her right-sided neck pain/tightness, however this pain did radiate down her entire left arm to the left neck and chest wall. She also had a severe headache at the time. She became nervous and considered going to the emergency room, however she took a Goody powder which resolved the pain completely. Today she does not have any pain except for the right posterior neck pain which has been ongoing for the past 2-3 months. Patient denies any exertional chest pain, anginal symptoms, dyspnea, palpitations, syncope, leg edema, vision loss, cough, fever.   She has no cardiac history but did have a stroke in 2011. She has been taking her daily aspirin.  3. Amenorrhea. Patient with fibroids and menorrhagia prior to 6 months ago. We reviewed her labs showing elevated FSH and LH consistent with peri-menopausal status, otherwise normal labs.  Review of Systems See HPI otherwise negative.  reports that she quit smoking about 2 years ago. She started smoking about 3 years ago. She does not have any smokeless tobacco history on file.     Objective:   Physical Exam  Vitals reviewed. Constitutional: She is oriented to person, place, and time. She appears well-developed and well-nourished. No distress.  HENT:  Head: Normocephalic and atraumatic.  Mouth/Throat: Oropharynx is clear and moist. No oropharyngeal exudate.  No temporal TTP.  Eyes: EOM are normal. Pupils are equal, round, and reactive to light.  Neck: Normal range of motion. Neck supple.  Right paraspinal cervical muscle spasm, trigger point. Range of motion intact. Negative Spurling.  Cardiovascular: Normal rate,  regular rhythm and normal heart sounds.   No murmur heard. Pulmonary/Chest: Effort normal and breath sounds normal. No respiratory distress. She has no wheezes. She has no rales.  Musculoskeletal:  No tenderness to palpation in upper extremities. Shoulder range of motion intact  Neurological: She is alert and oriented to person, place, and time. No cranial nerve deficit.  Skin: She is not diaphoretic.  Psychiatric:  Labile affect. She cries and laughs.          Assessment & Plan:

## 2012-09-02 NOTE — Patient Instructions (Addendum)
If you have neck pain, try flexeril at night for muscle relaxant. Motrin could work for joint pain or headache. Do not use everyday or more than 2-3 times per week. You maybe menopausal, still possible to get pregnant though. Use protection. Start cholesterol medication atorvastatin (change). If you have chest pain, then go to the ER. Make an appointment for blood pressure check in 1-2 months.  Come back sooner if symptoms not improved or worsen.

## 2012-09-02 NOTE — Assessment & Plan Note (Signed)
Restart statin and recheck LDL in 3 months.

## 2012-09-08 ENCOUNTER — Other Ambulatory Visit: Payer: Self-pay | Admitting: Family Medicine

## 2012-09-08 MED ORDER — HYDROCHLOROTHIAZIDE 25 MG PO TABS: 25.0000 mg | ORAL_TABLET | Freq: Every day | ORAL | Status: DC

## 2012-12-21 ENCOUNTER — Other Ambulatory Visit: Payer: Self-pay | Admitting: Family Medicine

## 2012-12-22 ENCOUNTER — Telehealth: Payer: Self-pay | Admitting: Family Medicine

## 2012-12-22 NOTE — Telephone Encounter (Signed)
To [facility] red team - please call pt and let her know I have refilled one month's worth of her triamcinolone but she needs to schedule an appointment to follow up on her medical problems. Thanks!  Latrelle Dodrill, MD

## 2012-12-23 NOTE — Telephone Encounter (Signed)
Pt notified to schedule an appt.  Analis Distler L, CMA  

## 2013-01-28 ENCOUNTER — Other Ambulatory Visit: Payer: Self-pay

## 2013-06-28 ENCOUNTER — Emergency Department (HOSPITAL_COMMUNITY): Payer: Medicaid Other

## 2013-06-28 ENCOUNTER — Encounter (HOSPITAL_COMMUNITY): Payer: Self-pay | Admitting: Emergency Medicine

## 2013-06-28 ENCOUNTER — Emergency Department (HOSPITAL_COMMUNITY)
Admission: EM | Admit: 2013-06-28 | Discharge: 2013-06-28 | Disposition: A | Discharge status: Home / Self Care | Payer: Medicaid Other | Attending: Emergency Medicine | Admitting: Emergency Medicine

## 2013-06-28 DIAGNOSIS — Z8659 Personal history of other mental and behavioral disorders: Secondary | ICD-10-CM | POA: Insufficient documentation

## 2013-06-28 DIAGNOSIS — R079 Chest pain, unspecified: Secondary | ICD-10-CM | POA: Insufficient documentation

## 2013-06-28 DIAGNOSIS — Z8673 Personal history of transient ischemic attack (TIA), and cerebral infarction without residual deficits: Secondary | ICD-10-CM | POA: Insufficient documentation

## 2013-06-28 DIAGNOSIS — Z8719 Personal history of other diseases of the digestive system: Secondary | ICD-10-CM | POA: Insufficient documentation

## 2013-06-28 DIAGNOSIS — I10 Essential (primary) hypertension: Secondary | ICD-10-CM | POA: Insufficient documentation

## 2013-06-28 DIAGNOSIS — Z8639 Personal history of other endocrine, nutritional and metabolic disease: Secondary | ICD-10-CM | POA: Insufficient documentation

## 2013-06-28 DIAGNOSIS — Z79899 Other long term (current) drug therapy: Secondary | ICD-10-CM | POA: Insufficient documentation

## 2013-06-28 DIAGNOSIS — Z87891 Personal history of nicotine dependence: Secondary | ICD-10-CM | POA: Insufficient documentation

## 2013-06-28 DIAGNOSIS — Z862 Personal history of diseases of the blood and blood-forming organs and certain disorders involving the immune mechanism: Secondary | ICD-10-CM | POA: Insufficient documentation

## 2013-06-28 DIAGNOSIS — Z872 Personal history of diseases of the skin and subcutaneous tissue: Secondary | ICD-10-CM | POA: Insufficient documentation

## 2013-06-28 LAB — CBC
HCT: 39.2 % (ref 36.0–46.0)
Hemoglobin: 13.2 g/dL (ref 12.0–15.0)
MCH: 28.4 pg (ref 26.0–34.0)
MCHC: 33.7 g/dL (ref 30.0–36.0)
MCV: 84.3 fL (ref 78.0–100.0)
Platelets: 211 10*3/uL (ref 150–400)
RBC: 4.65 MIL/uL (ref 3.87–5.11)
RDW: 12.5 % (ref 11.5–15.5)
WBC: 7.3 10*3/uL (ref 4.0–10.5)

## 2013-06-28 LAB — BASIC METABOLIC PANEL
BUN: 9 mg/dL (ref 6–23)
CO2: 26 mEq/L (ref 19–32)
Calcium: 9.1 mg/dL (ref 8.4–10.5)
Chloride: 103 mEq/L (ref 96–112)
Creatinine, Ser: 0.62 mg/dL (ref 0.50–1.10)
GFR calc Af Amer: >90 mL/min (ref >90)
GFR calc non Af Amer: >90 mL/min (ref >90)
Glucose, Bld: 87 mg/dL (ref 70–99)
Potassium: 3.4 mEq/L — ABNORMAL LOW (ref 3.7–5.3)
Sodium: 144 mEq/L (ref 137–147)

## 2013-06-28 LAB — I-STAT TROPONIN, ED: Troponin i, poc: 0 ng/mL (ref 0.00–0.08)

## 2013-06-28 MED ORDER — LISINOPRIL 10 MG PO TABS: 20.0000 mg | ORAL_TABLET | Freq: Every day | ORAL | Status: DC

## 2013-06-28 MED ORDER — HYDROCHLOROTHIAZIDE 25 MG PO TABS: 25.0000 mg | ORAL_TABLET | Freq: Every day | ORAL | Status: DC

## 2013-06-28 NOTE — ED Notes (Signed)
Pt states her BP has been elevated since Tuesday. Also reports having a funny feeling in the upper left chest. States she has been sweating like crazy for "a while" cause she has been going the life change.

## 2013-06-28 NOTE — Discharge Instructions (Signed)
Arterial Hypertension °Arterial hypertension (high blood pressure) is a condition of elevated pressure in your blood vessels. Hypertension over a long period of time is a risk factor for strokes, heart attacks, and heart failure. It is also the leading cause of kidney (renal) failure.  °CAUSES  °· In Adults -- Over 90% of all hypertension has no known cause. This is called essential or primary hypertension. In the other 10% of people with hypertension, the increase in blood pressure is caused by another disorder. This is called secondary hypertension. Important causes of secondary hypertension are: °· Heavy alcohol use. °· Obstructive sleep apnea. °· Hyperaldosterosim (Conn's syndrome). °· Steroid use. °· Chronic kidney failure. °· Hyperparathyroidism. °· Medications. °· Renal artery stenosis. °· Pheochromocytoma. °· Cushing's disease. °· Coarctation of the aorta. °· Scleroderma renal crisis. °· Licorice (in excessive amounts). °· Drugs (cocaine, methamphetamine). °Your caregiver can explain any items above that apply to you. °· In Children -- Secondary hypertension is more common and should always be considered. °· Pregnancy -- Few women of childbearing age have high blood pressure. However, up to 10% of them develop hypertension of pregnancy. Generally, this will not harm the woman. It may be a sign of 3 complications of pregnancy: preeclampsia, HELLP syndrome, and eclampsia. Follow up and control with medication is necessary. °SYMPTOMS  °· This condition normally does not produce any noticeable symptoms. It is usually found during a routine exam. °· Malignant hypertension is a late problem of high blood pressure. It may have the following symptoms: °· Headaches. °· Blurred vision. °· End-organ damage (this means your kidneys, heart, lungs, and other organs are being damaged). °· Stressful situations can increase the blood pressure. If a person with normal blood pressure has their blood pressure go up while being  seen by their caregiver, this is often termed "white coat hypertension." Its importance is not known. It may be related with eventually developing hypertension or complications of hypertension. °· Hypertension is often confused with mental tension, stress, and anxiety. °DIAGNOSIS  °The diagnosis is made by 3 separate blood pressure measurements. They are taken at least 1 week apart from each other. If there is organ damage from hypertension, the diagnosis may be made without repeat measurements. °Hypertension is usually identified by having blood pressure readings: °· Above 140/90 mmHg measured in both arms, at 3 separate times, over a couple weeks. °· Over 130/80 mmHg should be considered a risk factor and may require treatment in patients with diabetes. °Blood pressure readings over 120/80 mmHg are called "pre-hypertension" even in non-diabetic patients. °To get a true blood pressure measurement, use the following guidelines. Be aware of the factors that can alter blood pressure readings. °· Take measurements at least 1 hour after caffeine. °· Take measurements 30 minutes after smoking and without any stress. This is another reason to quit smoking  it raises your blood pressure. °· Use a proper cuff size. Ask your caregiver if you are not sure about your cuff size. °· Most home blood pressure cuffs are automatic. They will measure systolic and diastolic pressures. The systolic pressure is the pressure reading at the start of sounds. Diastolic pressure is the pressure at which the sounds disappear. If you are elderly, measure pressures in multiple postures. Try sitting, lying or standing. °· Sit at rest for a minimum of 5 minutes before taking measurements. °· You should not be on any medications like decongestants. These are found in many cold medications. °· Record your blood pressure readings and review   them with your caregiver. °If you have hypertension: °· Your caregiver may do tests to be sure you do not have  secondary hypertension (see "causes" above). °· Your caregiver may also look for signs of metabolic syndrome. This is also called Syndrome X or Insulin Resistance Syndrome. You may have this syndrome if you have type 2 diabetes, abdominal obesity, and abnormal blood lipids in addition to hypertension. °· Your caregiver will take your medical and family history and perform a physical exam. °· Diagnostic tests may include blood tests (for glucose, cholesterol, potassium, and kidney function), a urinalysis, or an EKG. Other tests may also be necessary depending on your condition. °PREVENTION  °There are important lifestyle issues that you can adopt to reduce your chance of developing hypertension: °· Maintain a normal weight. °· Limit the amount of salt (sodium) in your diet. °· Exercise often. °· Limit alcohol intake. °· Get enough potassium in your diet. Discuss specific advice with your caregiver. °· Follow a DASH diet (dietary approaches to stop hypertension). This diet is rich in fruits, vegetables, and low-fat dairy products, and avoids certain fats. °PROGNOSIS  °Essential hypertension cannot be cured. Lifestyle changes and medical treatment can lower blood pressure and reduce complications. The prognosis of secondary hypertension depends on the underlying cause. Many people whose hypertension is controlled with medicine or lifestyle changes can live a normal, healthy life.  °RISKS AND COMPLICATIONS  °While high blood pressure alone is not an illness, it often requires treatment due to its short- and long-term effects on many organs. Hypertension increases your risk for: °· CVAs or strokes (cerebrovascular accident). °· Heart failure due to chronically high blood pressure (hypertensive cardiomyopathy). °· Heart attack (myocardial infarction). °· Damage to the retina (hypertensive retinopathy). °· Kidney failure (hypertensive nephropathy). °Your caregiver can explain list items above that apply to you. Treatment  of hypertension can significantly reduce the risk of complications. °TREATMENT  °· For overweight patients, weight loss and regular exercise are recommended. Physical fitness lowers blood pressure. °· Mild hypertension is usually treated with diet and exercise. A diet rich in fruits and vegetables, fat-free dairy products, and foods low in fat and salt (sodium) can help lower blood pressure. Decreasing salt intake decreases blood pressure in a 1/3 of people. °· Stop smoking if you are a smoker. °The steps above are highly effective in reducing blood pressure. While these actions are easy to suggest, they are difficult to achieve. Most patients with moderate or severe hypertension end up requiring medications to bring their blood pressure down to a normal level. There are several classes of medications for treatment. Blood pressure pills (antihypertensives) will lower blood pressure by their different actions. Lowering the blood pressure by 10 mmHg may decrease the risk of complications by as much as 25%. °The goal of treatment is effective blood pressure control. This will reduce your risk for complications. Your caregiver will help you determine the best treatment for you according to your lifestyle. What is excellent treatment for one person, may not be for you. °HOME CARE INSTRUCTIONS  °· Do not smoke. °· Follow the lifestyle changes outlined in the "Prevention" section. °· If you are on medications, follow the directions carefully. Blood pressure medications must be taken as prescribed. Skipping doses reduces their benefit. It also puts you at risk for problems. °· Follow up with your caregiver, as directed. °· If you are asked to monitor your blood pressure at home, follow the guidelines in the "Diagnosis" section above. °SEEK MEDICAL CARE   IF:   You think you are having medication side effects.  You have recurrent headaches or lightheadedness.  You have swelling in your ankles.  You have trouble with  your vision. SEEK IMMEDIATE MEDICAL CARE IF:   You have sudden onset of chest pain or pressure, difficulty breathing, or other symptoms of a heart attack.  You have a severe headache.  You have symptoms of a stroke (such as sudden weakness, difficulty speaking, difficulty walking). MAKE SURE YOU:   Understand these instructions.  Will watch your condition.  Will get help right away if you are not doing well or get worse. Document Released: 03/11/2005 Document Revised: 06/03/2011 Document Reviewed: 10/09/2006 [facility] Patient Information 2014 [place].  How to Take Your Blood Pressure  These instructions are only for electronic home blood pressure machines. You will need:   An automatic or semi-automatic blood pressure machine.  Fresh batteries for the blood pressure machine. HOW DO I USE THESE TOOLS TO CHECK MY BLOOD PRESSURE?   There are 2 numbers that make up your blood pressure. For example: 120/80.  The first number (120 in our example) is called the "systolic pressure." It is a measure of the pressure in your blood vessels when your heart is pumping blood.  The second number (80 in our example) is called the "diastolic pressure." It is a measure of the pressure in your blood vessels when your heart is resting between beats.  Before you buy a home blood pressure machine, check the size of your arm so you can buy the right size cuff. Here is how to check the size of your arm:  Use a tape measure that shows both inches and centimeters.  Wrap the tape measure around the middle upper part of your arm. You may need someone to help you measure right.  Write down your arm measurement in both inches and centimeters.  To measure your blood pressure right, it is important to have the right size cuff.  If your arm is up to 13 inches (37 to 34 centimeters), get an adult cuff size.  If your arm is 13 to 17 inches (35 to 44 centimeters), get a large adult cuff size.  If  your arm is 17 to 20 inches (45 to 52 centimeters), get an adult thigh cuff.  Try to rest or relax for at least 30 minutes before you check your blood pressure.  Do not smoke.  Do not have any drinks with caffeine, such as:  Pop.  Coffee.  Tea.  Check your blood pressure in a quiet room.  Sit down and stretch out your arm on a table. Keep your arm at about the level of your heart. Let your arm relax. GETTING BLOOD PRESSURE READINGS  Make sure you remove any tight-fighting clothing from your arm. Wrap the cuff around your upper arm. Wrap it just above the bend, and above where you felt the pulse. You should be able to slip a finger between the cuff and your arm. If you cannot slip a finger in the cuff, it is too tight and should be removed and rewrapped.  Some units requires you to manually pump up the arm cuff.  Automatic units inflate the cuff when you press a button.  Cuff deflation is automatic in both models.  After the cuff is inflated, the unit measures your blood pressure and pulse. The readings are displayed on a monitor. Hold still and breathe normally while the cuff is inflated.  Getting a  reading takes less than a minute.  Some models store readings in a memory. Some provide a printout of readings.  Get readings at different times of the day. You should wait at least 5 minutes between readings. Take readings with you to your next doctor's visit. Document Released: 02/22/2008 Document Revised: 06/03/2011 Document Reviewed: 02/22/2008 [facility] Patient Information 2014 [place].   Emergency Department Resource Guide 1) Find a Doctor and Pay Out of Pocket Although you won't have to find out who is covered by your insurance plan, it is a good idea to ask around and get recommendations. You will then need to call the office and see if the doctor you have chosen will accept you as a new patient and what types of options they offer for patients who are self-pay. Some  doctors offer discounts or will set up payment plans for their patients who do not have insurance, but you will need to ask so you aren't surprised when you get to your appointment.  2) Contact Your Local Health Department Not all health departments have doctors that can see patients for sick visits, but many do, so it is worth a call to see if yours does. If you don't know where your local health department is, you can check in your phone book. The CDC also has a tool to help you locate your state's health department, and many state websites also have listings of all of their local health departments.  3) Find a [facility] If your illness is not likely to be very severe or complicated, you may want to try a walk in clinic. These are popping up all over the country in pharmacies, drugstores, and shopping centers. They're usually staffed by nurse practitioners or physician assistants that have been trained to treat common illnesses and complaints. They're usually fairly quick and inexpensive. However, if you have serious medical issues or chronic medical problems, these are probably not your best option.  No Primary Care Doctor: - Call Health Connect at  [phone] - they can help you locate a primary care doctor that  accepts your insurance, provides certain services, etc. - Physician Referral Service- [phone]  Chronic Pain Problems: Organization         Address  Phone   Notes  [facility]  [phone] Patients need to be referred by their primary care doctor.   Medication Assistance: Organization         Address  Phone   Notes  [facility] [address] [place], [place] [address] [phone] --Must be a resident of [facility] -- Must have NO insurance coverage whatsoever (no Medicaid/ Medicare, etc.) -- The pt. MUST have a primary care doctor that directs their care regularly and follows them in the  community   MedAssist  [phone]   [employer]  [phone]    Agencies that provide inexpensive medical care: Organization         Address  Phone   Notes  [place]  [phone]   [facility]    [phone]   [facility] [place], [place] [address] [phone]   [place] [address] [place] [phone]   Planned Parenthood    [phone]   [facility]    [phone]   [facility] [place], [place]  Phone:  [phone], Fax:  [phone] Hours of Operation:  9 am - 6 pm, M-F.  Also accepts Medicaid/Medicare and self-pay.  [facility] for [place] [address], [address], [place] Phone: [phone], Fax: [phone]. Hours of Operation:  8:30 am - 5:30 pm, M-F.  Also accepts Medicaid and self-pay.  [facility] [place] [address], [place] Phone: [phone]   [place], [place], [place] [phone], Ext. [phone] Mondays & Thursdays: 7-9 AM.  First 15 patients are seen on a first come, first serve basis.    [place] Providers:  Organization         Address  Phone   Notes  [facility] [address], [place] [phone] Also accepts self-pay patients.  [facility] [address], [place]  [phone]   [place], [address], [place] [phone]   [facility] [address], [place] [phone]   Lucianne Lei [address], [place]   [phone] Only accepts [place] Access [place] patients after they have their name applied to their card.   Self-Pay (no insurance) in [facility]:  Organization         Address  Phone   Notes  Sickle Cell Patients, [facility]  Internal Medicine [place] [phone]   [facility] [place] [phone]   [facility] [place]  [place], [place], [place] [phone]   [facility]/Dr. Osei-Bonsu  [address] [place] or [address], [place] [phone] Phone number for both [place] and [place] locations is the same.  [facility] [address], [place] [phone]   [facility] [address] [place] or [address] [phone]   [facility] [address], [place] [phone], phone; [phone], fax Sees patients 1st and 3rd Saturday of every month.  Must not qualify for public or private insurance (i.e. Medicaid, Medicare, [place] Health Choice, Veterans' Benefits)  Household income should be no more than 200% of the poverty level The clinic cannot treat you if you are pregnant or think you are pregnant  Sexually transmitted diseases are not treated at the clinic.    Dental Care: Organization         Address  Phone  Notes  [facility] Department of [facility] [phone] Accepts children up to age 20 who are enrolled in [place] or [place]; pregnant women with a Medicaid card; and children who have applied for Medicaid or [place] Health Choice, but were declined, whose parents can pay a reduced fee at time of service.  [facility] Department of [facility]  [address], [place] [phone] Accepts children up to age 72 who are enrolled in [place] or [place]; pregnant women with a Medicaid card; and children who have applied for Medicaid or [place] Health Choice, but were declined, whose parents can pay a reduced fee at time of service.  [facility] Adult [facility] Access PROGRAM  [place] [phone] Patients are seen by appointment  only. Walk-ins are not accepted. [place] will see patients 34 years of age and older. Monday - Tuesday (8am-5pm) Most Wednesdays (8:30-5pm) $30 per visit, cash only  [facility] Adult Dental Access PROGRAM  [address], [facility] [phone] Patients are seen by appointment only. Walk-ins are not accepted. [place] will see patients 39 years of age and older. One Wednesday Evening (Monthly: Volunteer Based).  $30 per visit, cash only  [place]  [phone] for adults; Children under age 42, call Graduate Pediatric Dentistry at [phone]. Children aged 72-14, please call [phone] to request a pediatric application.  Dental services are provided in all areas of dental care including fillings, crowns and bridges, complete and partial dentures, implants, gum treatment, root canals, and extractions. Preventive care is also provided. Treatment is provided to both adults and children. Patients are selected via a lottery and there is often a waiting list.   [facility] [address] [place]  [phone] [email or web address]   [facility] [address] [place], [place] [phone], Ext. [phone] Second and Fourth Thursday of each month, opens at 6:30 AM; Clinic ends at 9 AM.  Patients are seen on a first-come first-served basis, and a limited number are seen during each clinic.   [facility]  [address] Hillard Danker [place], [place] [phone]   Eligibility Requirements You must have lived in [place], [place], or [place] counties for at least the last three months.   You cannot be eligible for state or federal sponsored [employer], including [employer], [place], or [employer].   You generally cannot be eligible for healthcare insurance through your employer.    How to apply: Eligibility screenings are held every  Tuesday and Wednesday afternoon from 1:00 pm until 4:00 pm. You do not need an appointment for the interview!  [facility] [address] [place], [place]   [place]  [place] Department  [place]  [phone]    Behavioral Health Resources in the Community: Intensive Outpatient Programs Organization         Address  Phone  Notes  [place]. [address] [place], [place] [phone]   [facility] Outpatient [address] [place], [place]   ADS: Alcohol & Drug Svcs [address] [place], [place]   [place] [address]  [place], [place] or [phone]   Substance Abuse Resources Organization         Address  Phone  Notes  Alcohol and Drug Services  2390232155   [place]  [phone]   [facility] [place]   Chinita Pester  [phone]   Residential & Outpatient Substance Abuse Program  [phone]   Psychological Services Organization         Address  Phone  Notes  [facility]  [place]  [phone]   [place] [address] [place] [phone] or [phone]    Mobile Crisis Teams Organization         Address  Phone  Notes  [facility], Mobile Crisis Care Unit  [phone]   [facility]  [address] [place], [place]   [facility] [address] [place] [phone]  Self-Help/Support Groups Organization         Address  Phone             Notes  [facility]ental Health Assoc. of [place] - variety of support groups  [phone] Call for more information  Narcotics Anonymous (NA), [facility] [address], [place]  2 meetings at this location    Statisticianesidential Treatment Programs Organization         Address  Phone  Notes  [facility] [address],    [place]  [phone]   [facility]  [address], [place] [phone], [place], [place] [phone]   [facility] [address], [place] [phone] Admissions: 8am-3pm M-F  [facility] [address]    [place], [place] [phone]   [facility] [address], [place], [place] [phone]   [facility] [address]  [place], [place] [phone]   [facility] - Intensive Outpatient [address], [place], [place] [phone]   [facility] ([facility] [address]  [place], [place] [phone] or [phone]   [facility] ([facility]) [address] [place], [place] [phone] Accepts Medicaid  [facility] [place] [address]  [place] [phone] Substance Abuse/Addiction Treatment   [facility] Behavioral Health Resources Organization         Address  Phone  Notes  [facility]  [phone]) [phone]   Angie FavaJulie Brannon, PhD [address], [address] [place], [place]   [phone] or [phone]   [facility]   [address] [place], [place] [phone]) [phone]   [facility] [address], [place], [place] [phone] Insurance/Medicaid/sponsorship through [facility]  [facility] Families [address]                                    [place], [place] [phone]) [phone] Therapy/tele-psych/case  [facility] [address].   [place], [place] [phone]    Dr. Lolly MustacheArfeen  [phone]) [phone]   [facility] [place]  [employer] [facility] [facility] 1) [address], [place] 2) [address], [place] 3)  [address], [place] [phone]  [phone]   [place] [facility] Hotline [phone] or [phone] (After  Hours)

## 2013-07-04 NOTE — ED Provider Notes (Signed)
CSN: [id number]     Arrival date & time 06/28/13  1216 History   First MD Initiated Contact with Patient 06/28/13 1311     Chief Complaint  Patient presents with  . Hypertension  . Chest Pain     (Consider location/radiation/quality/duration/timing/severity/associated sxs/prior Treatment) HPI  48 year old female with hypertension. Patient states that her blood pressure has been elevated since Tuesday. She admits that she does not regularly check up to him to recently. Denies any significant pain. No visual changes. No dizziness or lightheadedness. No shortness of breath. Does report intermittent and hot flashes but she reduced this to perimenopause/menopause. Has been previously pulled that she has high blood pressure and has been on medications. She cannot remember what she was taking though. Hx of stroke and anxious to get back on meds because of this.    Past Medical History  Diagnosis Date  . Psoriasis   . 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 Depression     sees Dr. Josetta Huddle  . Anemia     iron defi  . Eczema   . Hyperlipidemia   . Hypertension   . GERD (gastroesophageal reflux disease)    Past Surgical History  Procedure Laterality Date  . Cervical cerclage      x 2, in 1995, 1997   Family History  Problem Relation Age of Onset  . Cancer Mother 98    breast cancer x 2  . Cancer Father 68    pancreatic   History  Substance Use Topics  . Smoking status: Former Smoker -- 0.50 packs/day    Start date: 03/25/2009    Quit date: 04/14/2010  . Smokeless tobacco: Not on file  . Alcohol Use: No   OB History   Grav Para Term Preterm Abortions TAB SAB Ect Mult Living                 Review of Systems  All systems reviewed and negative, other than as noted in HPI.   Allergies  Review of patient's allergies indicates no known allergies.  Home Medications   Current Outpatient Rx  Name  Route  Sig  Dispense  Refill  . Aspirin-Salicylamide-Caffeine (BC HEADACHE  POWDER PO)   Oral   Take 1 Package by mouth 2 (two) times daily. For headaches         . hydrOXYzine (ATARAX/VISTARIL) 50 MG tablet   Oral   Take 50 mg by mouth 3 (three) times daily as needed.         . hydrochlorothiazide (HYDRODIURIL) 25 MG tablet   Oral   Take 1 tablet (25 mg total) by mouth daily.   30 tablet   0   . lisinopril (PRINIVIL,ZESTRIL) 10 MG tablet   Oral   Take 2 tablets (20 mg total) by mouth daily.   30 tablet   0    BP 145/85  Pulse 67  Temp(Src) 98.2 F (36.8 C) (Oral)  Resp 18  Ht 5\' 3"  (1.6 m)  Wt 166 lb (75.297 kg)  BMI 29.41 kg/m2  SpO2 100% Physical Exam  Nursing note and vitals reviewed. Constitutional: She is oriented to person, place, and time. She appears well-developed and well-nourished. No distress.  HENT:  Head: Normocephalic and atraumatic.  Eyes: Conjunctivae and EOM are normal. Pupils are equal, round, and reactive to light. Right eye exhibits no discharge. Left eye exhibits no discharge.  Neck: Neck supple.  No nuchal rigidity  Cardiovascular: Normal rate, regular rhythm and  normal heart sounds.  Exam reveals no gallop and no friction rub.   No murmur heard. Pulmonary/Chest: Effort normal and breath sounds normal. No respiratory distress.  Abdominal: Soft. She exhibits no distension. There is no tenderness.  Musculoskeletal: She exhibits no edema and no tenderness.  Neurological: She is alert and oriented to person, place, and time. No cranial nerve deficit. She exhibits normal muscle tone. Coordination normal.  Skin: Skin is warm and dry.  Psychiatric: She has a normal mood and affect. Her behavior is normal. Thought content normal.    ED Course  Procedures (including critical care time) Labs Review Labs Reviewed  BASIC METABOLIC PANEL - Abnormal; Notable for the following:    Potassium 3.4 (*)    All other components within normal limits  CBC  I-STAT TROPOININ, ED   Imaging Review No results found.   EKG  Interpretation   Date/Time:  Monday June 28 2013 12:40:44 EDT Ventricular Rate:  67 PR Interval:  156 QRS Duration: 152 QT Interval:  428 QTC Calculation: 452 R Axis:   65 Text Interpretation:  Normal sinus rhythm Right bundle branch block  Abnormal ECG ED PHYSICIAN INTERPRETATION AVAILABLE IN [facility]  Confirmed by TEST, Record ([id number]) on 06/30/2013 7:12:11 AM      MDM   Final diagnoses:  Hypertension    48 year old female with asymptomatic hypertension. Workup fairly unremarkable. Prescriptions for medications provided. Needs to establish primary care and followup for further management.    Virgel Manifold, MD 07/04/13 1925

## 2013-12-01 DIAGNOSIS — M064 Inflammatory polyarthropathy: Secondary | ICD-10-CM | POA: Insufficient documentation

## 2013-12-01 DIAGNOSIS — L409 Psoriasis, unspecified: Secondary | ICD-10-CM | POA: Insufficient documentation

## 2013-12-01 DIAGNOSIS — M359 Systemic involvement of connective tissue, unspecified: Secondary | ICD-10-CM | POA: Insufficient documentation

## 2015-01-02 DIAGNOSIS — H52209 Unspecified astigmatism, unspecified eye: Secondary | ICD-10-CM | POA: Diagnosis not present

## 2015-01-02 DIAGNOSIS — M549 Dorsalgia, unspecified: Secondary | ICD-10-CM | POA: Diagnosis not present

## 2015-01-02 DIAGNOSIS — R1011 Right upper quadrant pain: Secondary | ICD-10-CM | POA: Diagnosis not present

## 2015-01-02 DIAGNOSIS — I10 Essential (primary) hypertension: Secondary | ICD-10-CM | POA: Diagnosis not present

## 2015-01-02 DIAGNOSIS — E7800 Pure hypercholesterolemia, unspecified: Secondary | ICD-10-CM | POA: Diagnosis not present

## 2015-01-02 DIAGNOSIS — K219 Gastro-esophageal reflux disease without esophagitis: Secondary | ICD-10-CM | POA: Diagnosis not present

## 2015-01-02 DIAGNOSIS — G629 Polyneuropathy, unspecified: Secondary | ICD-10-CM | POA: Diagnosis not present

## 2015-01-02 DIAGNOSIS — I6990 Unspecified sequelae of unspecified cerebrovascular disease: Secondary | ICD-10-CM | POA: Diagnosis not present

## 2015-01-05 ENCOUNTER — Other Ambulatory Visit: Payer: Self-pay | Admitting: Family Medicine

## 2015-01-05 DIAGNOSIS — R1011 Right upper quadrant pain: Secondary | ICD-10-CM

## 2015-01-12 ENCOUNTER — Other Ambulatory Visit: Payer: Medicaid Other

## 2015-01-17 ENCOUNTER — Ambulatory Visit
Admission: RE | Admit: 2015-01-17 | Discharge: 2015-01-17 | Disposition: A | Discharge status: Home / Self Care | Payer: Commercial Managed Care - HMO | Source: Ambulatory Visit | Attending: Family Medicine | Admitting: Family Medicine

## 2015-01-17 DIAGNOSIS — R1011 Right upper quadrant pain: Secondary | ICD-10-CM | POA: Diagnosis not present

## 2015-01-17 DIAGNOSIS — K8020 Calculus of gallbladder without cholecystitis without obstruction: Secondary | ICD-10-CM | POA: Diagnosis not present

## 2015-01-23 DIAGNOSIS — G629 Polyneuropathy, unspecified: Secondary | ICD-10-CM | POA: Diagnosis not present

## 2015-01-23 DIAGNOSIS — I6990 Unspecified sequelae of unspecified cerebrovascular disease: Secondary | ICD-10-CM | POA: Diagnosis not present

## 2015-01-23 DIAGNOSIS — E7800 Pure hypercholesterolemia, unspecified: Secondary | ICD-10-CM | POA: Diagnosis not present

## 2015-01-23 DIAGNOSIS — H52209 Unspecified astigmatism, unspecified eye: Secondary | ICD-10-CM | POA: Diagnosis not present

## 2015-01-23 DIAGNOSIS — K219 Gastro-esophageal reflux disease without esophagitis: Secondary | ICD-10-CM | POA: Diagnosis not present

## 2015-01-23 DIAGNOSIS — R1011 Right upper quadrant pain: Secondary | ICD-10-CM | POA: Diagnosis not present

## 2015-01-23 DIAGNOSIS — I10 Essential (primary) hypertension: Secondary | ICD-10-CM | POA: Diagnosis not present

## 2015-01-23 DIAGNOSIS — K8020 Calculus of gallbladder without cholecystitis without obstruction: Secondary | ICD-10-CM | POA: Diagnosis not present

## 2015-02-03 ENCOUNTER — Emergency Department (HOSPITAL_COMMUNITY): Payer: Commercial Managed Care - HMO

## 2015-02-03 ENCOUNTER — Ambulatory Visit (HOSPITAL_COMMUNITY): Payer: Commercial Managed Care - HMO | Admitting: Anesthesiology

## 2015-02-03 ENCOUNTER — Encounter (HOSPITAL_COMMUNITY)
Admission: EM | Disposition: A | Discharge status: Home / Self Care | Payer: Self-pay | Source: Home / Self Care | Attending: Emergency Medicine

## 2015-02-03 ENCOUNTER — Ambulatory Visit (HOSPITAL_COMMUNITY)
Admission: EM | Admit: 2015-02-03 | Discharge: 2015-02-05 | Disposition: A | Discharge status: Home / Self Care | Payer: Commercial Managed Care - HMO | Attending: General Surgery | Admitting: General Surgery

## 2015-02-03 ENCOUNTER — Encounter (HOSPITAL_COMMUNITY): Payer: Self-pay

## 2015-02-03 DIAGNOSIS — K219 Gastro-esophageal reflux disease without esophagitis: Secondary | ICD-10-CM | POA: Insufficient documentation

## 2015-02-03 DIAGNOSIS — Z87891 Personal history of nicotine dependence: Secondary | ICD-10-CM | POA: Insufficient documentation

## 2015-02-03 DIAGNOSIS — K8070 Calculus of gallbladder and bile duct without cholecystitis without obstruction: Secondary | ICD-10-CM

## 2015-02-03 DIAGNOSIS — E785 Hyperlipidemia, unspecified: Secondary | ICD-10-CM | POA: Diagnosis not present

## 2015-02-03 DIAGNOSIS — K8020 Calculus of gallbladder without cholecystitis without obstruction: Secondary | ICD-10-CM | POA: Diagnosis not present

## 2015-02-03 DIAGNOSIS — G2581 Restless legs syndrome: Secondary | ICD-10-CM | POA: Insufficient documentation

## 2015-02-03 DIAGNOSIS — I10 Essential (primary) hypertension: Secondary | ICD-10-CM | POA: Insufficient documentation

## 2015-02-03 DIAGNOSIS — B9681 Helicobacter pylori [H. pylori] as the cause of diseases classified elsewhere: Secondary | ICD-10-CM | POA: Insufficient documentation

## 2015-02-03 DIAGNOSIS — D649 Anemia, unspecified: Secondary | ICD-10-CM | POA: Diagnosis not present

## 2015-02-03 DIAGNOSIS — Z8673 Personal history of transient ischemic attack (TIA), and cerebral infarction without residual deficits: Secondary | ICD-10-CM | POA: Diagnosis not present

## 2015-02-03 DIAGNOSIS — K8010 Calculus of gallbladder with chronic cholecystitis without obstruction: Secondary | ICD-10-CM | POA: Insufficient documentation

## 2015-02-03 DIAGNOSIS — K8050 Calculus of bile duct without cholangitis or cholecystitis without obstruction: Secondary | ICD-10-CM | POA: Diagnosis present

## 2015-02-03 DIAGNOSIS — R1011 Right upper quadrant pain: Secondary | ICD-10-CM | POA: Diagnosis not present

## 2015-02-03 HISTORY — PX: CHOLECYSTECTOMY: SHX55

## 2015-02-03 LAB — CBC
HCT: 35.3 % — ABNORMAL LOW (ref 36.0–46.0)
HEMATOCRIT: 39.5 % (ref 36.0–46.0)
Hemoglobin: 13.1 g/dL (ref 12.0–15.0)
Hemoglobin: 11.5 g/dL — ABNORMAL LOW (ref 12.0–15.0)
MCH: 27.2 pg (ref 26.0–34.0)
MCH: 27.4 pg (ref 26.0–34.0)
MCHC: 33.2 g/dL (ref 30.0–36.0)
MCHC: 32.6 g/dL (ref 30.0–36.0)
MCV: 82 fL (ref 78.0–100.0)
MCV: 84.2 fL (ref 78.0–100.0)
PLATELETS: 246 10*3/uL (ref 150–400)
Platelets: 199 K/uL (ref 150–400)
RBC: 4.82 MIL/uL (ref 3.87–5.11)
RBC: 4.19 MIL/uL (ref 3.87–5.11)
RDW: 12.9 % (ref 11.5–15.5)
RDW: 13 % (ref 11.5–15.5)
WBC: 7.8 10*3/uL (ref 4.0–10.5)
WBC: 11.4 K/uL — ABNORMAL HIGH (ref 4.0–10.5)

## 2015-02-03 LAB — LIPASE, BLOOD: Lipase: 19 U/L (ref 11–51)

## 2015-02-03 LAB — CREATININE, SERUM
CREATININE: 0.74 mg/dL (ref 0.44–1.00)
GFR calc non Af Amer: >60 mL/min (ref >60)

## 2015-02-03 LAB — COMPREHENSIVE METABOLIC PANEL
ALK PHOS: 47 U/L (ref 38–126)
ALT: 31 U/L (ref 14–54)
ANION GAP: 8 (ref 5–15)
AST: 26 U/L (ref 15–41)
Albumin: 4.2 g/dL (ref 3.5–5.0)
BILIRUBIN TOTAL: 0.5 mg/dL (ref 0.3–1.2)
BUN: 15 mg/dL (ref 6–20)
CALCIUM: 9.7 mg/dL (ref 8.9–10.3)
CO2: 26 mmol/L (ref 22–32)
Chloride: 105 mmol/L (ref 101–111)
Creatinine, Ser: 0.69 mg/dL (ref 0.44–1.00)
GFR calc non Af Amer: >60 mL/min (ref >60)
Glucose, Bld: 104 mg/dL — ABNORMAL HIGH (ref 65–99)
Potassium: 3.3 mmol/L — ABNORMAL LOW (ref 3.5–5.1)
Sodium: 139 mmol/L (ref 135–145)
Total Protein: 8.6 g/dL — ABNORMAL HIGH (ref 6.5–8.1)

## 2015-02-03 SURGERY — LAPAROSCOPIC CHOLECYSTECTOMY
Anesthesia: General | Site: Abdomen

## 2015-02-03 MED ORDER — SUCCINYLCHOLINE CHLORIDE 20 MG/ML IJ SOLN
INTRAMUSCULAR | Status: DC | PRN
  Administered 2015-02-03: 100 mg via INTRAVENOUS

## 2015-02-03 MED ORDER — PROPOFOL 10 MG/ML IV BOLUS
INTRAVENOUS | Status: AC
  Filled 2015-02-03: qty 20

## 2015-02-03 MED ORDER — FENTANYL CITRATE (PF) 250 MCG/5ML IJ SOLN
INTRAMUSCULAR | Status: AC
  Filled 2015-02-03: qty 5

## 2015-02-03 MED ORDER — ONDANSETRON HCL 4 MG/2ML IJ SOLN
INTRAMUSCULAR | Status: AC
  Filled 2015-02-03: qty 2

## 2015-02-03 MED ORDER — HYDROMORPHONE HCL 1 MG/ML IJ SOLN
0.2500 mg | INTRAMUSCULAR | Status: DC | PRN
  Administered 2015-02-03 (×4): 0.5 mg via INTRAVENOUS

## 2015-02-03 MED ORDER — GLYCOPYRROLATE 0.2 MG/ML IJ SOLN
INTRAMUSCULAR | Status: DC | PRN
  Administered 2015-02-03: 0.6 mg via INTRAVENOUS

## 2015-02-03 MED ORDER — HYDROMORPHONE HCL 1 MG/ML IJ SOLN
INTRAMUSCULAR | Status: AC
  Administered 2015-02-03: 0.5 mg via INTRAVENOUS
  Filled 2015-02-03: qty 1

## 2015-02-03 MED ORDER — SIMETHICONE 80 MG PO CHEW: 40.0000 mg | CHEWABLE_TABLET | Freq: Four times a day (QID) | ORAL | Status: DC | PRN

## 2015-02-03 MED ORDER — OXYCODONE HCL 5 MG PO TABS
5.0000 mg | ORAL_TABLET | ORAL | Status: DC | PRN
  Administered 2015-02-03 – 2015-02-04 (×3): 10 mg via ORAL
  Filled 2015-02-03 (×4): qty 2

## 2015-02-03 MED ORDER — DEXTROSE 5 % IV SOLN
2.0000 g | INTRAVENOUS | Status: DC
  Filled 2015-02-03: qty 2

## 2015-02-03 MED ORDER — AMOXICILLIN 500 MG PO CAPS
500.0000 mg | ORAL_CAPSULE | Freq: Two times a day (BID) | ORAL | Status: DC
  Administered 2015-02-04 – 2015-02-05 (×3): 500 mg via ORAL
  Filled 2015-02-03 (×5): qty 1

## 2015-02-03 MED ORDER — ONDANSETRON 4 MG PO TBDP: 4.0000 mg | ORAL_TABLET | Freq: Four times a day (QID) | ORAL | Status: DC | PRN

## 2015-02-03 MED ORDER — HYDROCHLOROTHIAZIDE 12.5 MG PO CAPS
12.5000 mg | ORAL_CAPSULE | Freq: Every day | ORAL | Status: DC
  Administered 2015-02-04: 12.5 mg via ORAL
  Filled 2015-02-03: qty 1

## 2015-02-03 MED ORDER — LOSARTAN POTASSIUM 50 MG PO TABS
50.0000 mg | ORAL_TABLET | Freq: Every day | ORAL | Status: DC
Start: 2015-02-03 — End: 2015-02-05
  Administered 2015-02-04: 50 mg via ORAL
  Filled 2015-02-03: qty 1

## 2015-02-03 MED ORDER — PANTOPRAZOLE SODIUM 40 MG PO TBEC
40.0000 mg | DELAYED_RELEASE_TABLET | Freq: Every day | ORAL | Status: DC
  Administered 2015-02-04 – 2015-02-05 (×2): 40 mg via ORAL
  Filled 2015-02-03 (×2): qty 1

## 2015-02-03 MED ORDER — DEXAMETHASONE SODIUM PHOSPHATE 4 MG/ML IJ SOLN
INTRAMUSCULAR | Status: AC
  Filled 2015-02-03: qty 2

## 2015-02-03 MED ORDER — ENOXAPARIN SODIUM 40 MG/0.4ML ~~LOC~~ SOLN
40.0000 mg | SUBCUTANEOUS | Status: DC
  Administered 2015-02-04 – 2015-02-05 (×2): 40 mg via SUBCUTANEOUS
  Filled 2015-02-03 (×2): qty 0.4

## 2015-02-03 MED ORDER — MORPHINE SULFATE (PF) 2 MG/ML IV SOLN
1.0000 mg | INTRAVENOUS | Status: DC | PRN
  Administered 2015-02-03: 2 mg via INTRAVENOUS
  Administered 2015-02-04: 4 mg via INTRAVENOUS
  Filled 2015-02-03: qty 1
  Filled 2015-02-03: qty 2

## 2015-02-03 MED ORDER — MORPHINE SULFATE (PF) 4 MG/ML IV SOLN
4.0000 mg | Freq: Once | INTRAVENOUS | Status: AC
  Administered 2015-02-03: 4 mg via INTRAVENOUS
  Filled 2015-02-03: qty 1

## 2015-02-03 MED ORDER — MIDAZOLAM HCL 5 MG/5ML IJ SOLN
INTRAMUSCULAR | Status: DC | PRN
  Administered 2015-02-03: 2 mg via INTRAVENOUS

## 2015-02-03 MED ORDER — CLARITHROMYCIN 500 MG PO TABS
500.0000 mg | ORAL_TABLET | Freq: Two times a day (BID) | ORAL | Status: DC
  Administered 2015-02-04 – 2015-02-05 (×3): 500 mg via ORAL
  Filled 2015-02-03 (×5): qty 1

## 2015-02-03 MED ORDER — ONDANSETRON 4 MG PO TBDP
ORAL_TABLET | ORAL | Status: AC
  Filled 2015-02-03: qty 1

## 2015-02-03 MED ORDER — PHENYLEPHRINE HCL 10 MG/ML IJ SOLN
INTRAMUSCULAR | Status: DC | PRN
  Administered 2015-02-03: 80 ug via INTRAVENOUS
  Administered 2015-02-03: 40 ug via INTRAVENOUS

## 2015-02-03 MED ORDER — 0.9 % SODIUM CHLORIDE (POUR BTL) OPTIME
TOPICAL | Status: DC | PRN
  Administered 2015-02-03: 1000 mL

## 2015-02-03 MED ORDER — ROCURONIUM BROMIDE 100 MG/10ML IV SOLN
INTRAVENOUS | Status: DC | PRN
  Administered 2015-02-03: 30 mg via INTRAVENOUS

## 2015-02-03 MED ORDER — KCL IN DEXTROSE-NACL 20-5-0.45 MEQ/L-%-% IV SOLN
INTRAVENOUS | Status: DC
  Administered 2015-02-04: 02:00:00 via INTRAVENOUS
  Filled 2015-02-03: qty 1000

## 2015-02-03 MED ORDER — LIDOCAINE HCL (CARDIAC) 20 MG/ML IV SOLN
INTRAVENOUS | Status: DC | PRN
  Administered 2015-02-03: 100 mg via INTRAVENOUS

## 2015-02-03 MED ORDER — BUPIVACAINE-EPINEPHRINE (PF) 0.25% -1:200000 IJ SOLN
INTRAMUSCULAR | Status: AC
  Filled 2015-02-03: qty 30

## 2015-02-03 MED ORDER — ASPIRIN EC 81 MG PO TBEC
81.0000 mg | DELAYED_RELEASE_TABLET | Freq: Every day | ORAL | Status: DC
  Administered 2015-02-04 – 2015-02-05 (×2): 81 mg via ORAL
  Filled 2015-02-03 (×2): qty 1

## 2015-02-03 MED ORDER — SODIUM CHLORIDE 0.9 % IR SOLN
Status: DC | PRN
Start: 2015-02-03 — End: 2015-02-03
  Administered 2015-02-03: 1000 mL

## 2015-02-03 MED ORDER — DEXAMETHASONE SODIUM PHOSPHATE 4 MG/ML IJ SOLN
INTRAMUSCULAR | Status: DC | PRN
  Administered 2015-02-03: 4 mg via INTRAVENOUS

## 2015-02-03 MED ORDER — DEXTROSE 5 % IV SOLN
2.0000 g | INTRAVENOUS | Status: DC
  Administered 2015-02-03: 2 g via INTRAVENOUS
  Filled 2015-02-03: qty 2

## 2015-02-03 MED ORDER — ONDANSETRON HCL 4 MG/2ML IJ SOLN
4.0000 mg | Freq: Four times a day (QID) | INTRAMUSCULAR | Status: DC | PRN
  Administered 2015-02-03: 4 mg via INTRAVENOUS
  Filled 2015-02-03: qty 2

## 2015-02-03 MED ORDER — LOSARTAN POTASSIUM-HCTZ 50-12.5 MG PO TABS: 1.0000 | ORAL_TABLET | Freq: Every day | ORAL | Status: DC

## 2015-02-03 MED ORDER — ACETAMINOPHEN 650 MG RE SUPP: 650.0000 mg | Freq: Four times a day (QID) | RECTAL | Status: DC | PRN

## 2015-02-03 MED ORDER — LACTATED RINGERS IV SOLN
INTRAVENOUS | Status: DC | PRN
  Administered 2015-02-03 (×2): via INTRAVENOUS

## 2015-02-03 MED ORDER — NEOSTIGMINE METHYLSULFATE 10 MG/10ML IV SOLN
INTRAVENOUS | Status: AC
  Filled 2015-02-03: qty 1

## 2015-02-03 MED ORDER — PROPOFOL 10 MG/ML IV BOLUS
INTRAVENOUS | Status: DC | PRN
  Administered 2015-02-03: 200 mg via INTRAVENOUS

## 2015-02-03 MED ORDER — FENTANYL CITRATE (PF) 100 MCG/2ML IJ SOLN
INTRAMUSCULAR | Status: DC | PRN
  Administered 2015-02-03 (×3): 50 ug via INTRAVENOUS
  Administered 2015-02-03: 100 ug via INTRAVENOUS
  Administered 2015-02-03 (×3): 50 ug via INTRAVENOUS

## 2015-02-03 MED ORDER — LACTATED RINGERS IV SOLN: INTRAVENOUS | Status: DC

## 2015-02-03 MED ORDER — SODIUM CHLORIDE 0.9 % IV BOLUS (SEPSIS)
1000.0000 mL | Freq: Once | INTRAVENOUS | Status: AC
  Administered 2015-02-03: 1000 mL via INTRAVENOUS

## 2015-02-03 MED ORDER — NEOSTIGMINE METHYLSULFATE 10 MG/10ML IV SOLN
INTRAVENOUS | Status: DC | PRN
  Administered 2015-02-03: 4 mg via INTRAVENOUS

## 2015-02-03 MED ORDER — GABAPENTIN 300 MG PO CAPS
600.0000 mg | ORAL_CAPSULE | Freq: Three times a day (TID) | ORAL | Status: DC
  Administered 2015-02-04 – 2015-02-05 (×4): 600 mg via ORAL
  Filled 2015-02-03 (×5): qty 2

## 2015-02-03 MED ORDER — PROMETHAZINE HCL 25 MG/ML IJ SOLN: 6.2500 mg | INTRAMUSCULAR | Status: DC | PRN

## 2015-02-03 MED ORDER — GLYCOPYRROLATE 0.2 MG/ML IJ SOLN
INTRAMUSCULAR | Status: AC
  Filled 2015-02-03: qty 3

## 2015-02-03 MED ORDER — BUPIVACAINE-EPINEPHRINE 0.25% -1:200000 IJ SOLN
INTRAMUSCULAR | Status: DC | PRN
  Administered 2015-02-03: 18 mL

## 2015-02-03 MED ORDER — ONDANSETRON HCL 4 MG/2ML IJ SOLN
INTRAMUSCULAR | Status: DC | PRN
  Administered 2015-02-03: 4 mg via INTRAVENOUS

## 2015-02-03 MED ORDER — MIDAZOLAM HCL 2 MG/2ML IJ SOLN
INTRAMUSCULAR | Status: AC
  Filled 2015-02-03: qty 4

## 2015-02-03 MED ORDER — ONDANSETRON 4 MG PO TBDP
4.0000 mg | ORAL_TABLET | Freq: Once | ORAL | Status: AC | PRN
  Administered 2015-02-03: 4 mg via ORAL

## 2015-02-03 MED ORDER — ACETAMINOPHEN 325 MG PO TABS
650.0000 mg | ORAL_TABLET | Freq: Four times a day (QID) | ORAL | Status: DC | PRN
  Administered 2015-02-04: 650 mg via ORAL
  Filled 2015-02-03 (×2): qty 2

## 2015-02-03 SURGICAL SUPPLY — 40 items
APL SKNCLS STERI-STRIP NONHPOA (GAUZE/BANDAGES/DRESSINGS) ×2
APPLIER CLIP ROT 10 11.4 M/L (STAPLE) ×3
APR CLP MED LRG 11.4X10 (STAPLE) ×2
BAG SPEC RTRVL 10 TROC 200 (ENDOMECHANICALS) ×2
BANDAGE ADH SHEER 1  50/CT (GAUZE/BANDAGES/DRESSINGS) ×12 IMPLANT
BENZOIN TINCTURE PRP APPL 2/3 (GAUZE/BANDAGES/DRESSINGS) ×3 IMPLANT
BLADE SURG ROTATE 9660 (MISCELLANEOUS) IMPLANT
CANISTER SUCTION 2500CC (MISCELLANEOUS) ×3 IMPLANT
CHLORAPREP W/TINT 26ML (MISCELLANEOUS) ×3 IMPLANT
CLIP APPLIE ROT 10 11.4 M/L (STAPLE) ×2 IMPLANT
COVER MAYO STAND STRL (DRAPES) ×3 IMPLANT
COVER SURGICAL LIGHT HANDLE (MISCELLANEOUS) ×3 IMPLANT
DEVICE TROCAR PUNCTURE CLOSURE (ENDOMECHANICALS) ×2 IMPLANT
DRAPE C-ARM 42X72 X-RAY (DRAPES) ×3 IMPLANT
ELECT REM PT RETURN 9FT ADLT (ELECTROSURGICAL) ×3
ELECTRODE REM PT RTRN 9FT ADLT (ELECTROSURGICAL) ×2 IMPLANT
GLOVE BIOGEL PI IND STRL 7.0 (GLOVE) ×2 IMPLANT
GLOVE BIOGEL PI INDICATOR 7.0 (GLOVE) ×1
GLOVE SURG SS PI 7.0 STRL IVOR (GLOVE) ×3 IMPLANT
GOWN STRL REUS W/ TWL LRG LVL3 (GOWN DISPOSABLE) ×6 IMPLANT
GOWN STRL REUS W/TWL LRG LVL3 (GOWN DISPOSABLE) ×9
KIT BASIN OR (CUSTOM PROCEDURE TRAY) ×3 IMPLANT
KIT ROOM TURNOVER OR (KITS) ×3 IMPLANT
NS IRRIG 1000ML POUR BTL (IV SOLUTION) ×3 IMPLANT
PAD ARMBOARD 7.5X6 YLW CONV (MISCELLANEOUS) ×3 IMPLANT
POUCH RETRIEVAL ECOSAC 10 (ENDOMECHANICALS) ×2 IMPLANT
POUCH RETRIEVAL ECOSAC 10MM (ENDOMECHANICALS) ×1
SCISSORS LAP 5X35 DISP (ENDOMECHANICALS) ×3 IMPLANT
SET CHOLANGIOGRAPH 5 50 .035 (SET/KITS/TRAYS/PACK) ×3 IMPLANT
SET IRRIG TUBING LAPAROSCOPIC (IRRIGATION / IRRIGATOR) ×3 IMPLANT
SLEEVE ENDOPATH XCEL 5M (ENDOMECHANICALS) ×6 IMPLANT
SPECIMEN JAR SMALL (MISCELLANEOUS) ×3 IMPLANT
STRIP CLOSURE SKIN 1/2X4 (GAUZE/BANDAGES/DRESSINGS) ×3 IMPLANT
SUT MNCRL AB 4-0 PS2 18 (SUTURE) ×3 IMPLANT
TOWEL OR 17X24 6PK STRL BLUE (TOWEL DISPOSABLE) ×3 IMPLANT
TOWEL OR 17X26 10 PK STRL BLUE (TOWEL DISPOSABLE) ×3 IMPLANT
TRAY LAPAROSCOPIC MC (CUSTOM PROCEDURE TRAY) ×3 IMPLANT
TROCAR XCEL 12X100 BLDLESS (ENDOMECHANICALS) ×3 IMPLANT
TROCAR XCEL NON-BLD 5MMX100MML (ENDOMECHANICALS) ×3 IMPLANT
TUBING INSUFFLATION (TUBING) ×3 IMPLANT

## 2015-02-03 NOTE — Op Note (Signed)
Preoperative diagnosis: symptomatic gallstones  Postoperative diagnosis: chronic cholecystitis  Procedure: laparoscopic cholecystectomy  Surgeon: Gurney Maxin, M.D.  Asst: none  Anesthesia: Gen.   Indications for procedure: Ashley Lewis is a 49 y.o. female with symptoms of Abdominal pain, RUQ pain and Nausea consistent with gallbladder disease, Confirmed by Ultrasound.  Description of procedure: The patient was brought into the operative suite, placed supine. Anesthesia was administered with endotracheal tube. Patient was strapped in place and foot board was secured. All pressure points were offloaded by foam padding. The patient was prepped and draped in the usual sterile fashion.  A small incision was made to the right of the umbilicus. A 42mm trocar was inserted into the peritoneal cavity with optical entry. Pneumoperitoneum was applied with high flow low pressure. 2 62mm trocars were placed in the RUQ. A 5mm trocar was placed in the subxiphoid space. All trocars sites were first anesthesized with 0.25% marcaine with epinephrine in the subcutaneous and preperitoneal layers. Next the patient was placed in reverse trendelenberg. The gallbladder was retracted cephalad and lateral. The peritoneum was reflected off the infundibulum working lateral to medial.   The cystic duct and cystic artery were identified and further dissection revealed a critical view.. The cystic duct and cystic artery were doubly clipped and ligated.   The gallbladder was removed off the liver bed with cautery. The gallbladder was entered on this maneuver and all bile was removed with suction. The Gallbladder was placed in a specimen bag. The gallbladder fossa was irrigated and hemostasis was applied with cautery. The gallbladder was removed via the 17mm trocar. Minimal dilation was performed. Pneumoperitoneum was removed, all trocar were removed. All incisions were closed with 4-0 monocryl subcuticular stitch. The  patient woke from anesthesia and was brought to PACU in stable condition.  Findings: inflamed gallbladder  Specimen: gallbldadder  Blood loss: 50cc  Local anesthesia: 18cc 0.25% marcaine w epi  Complications: none  Gurney Maxin, M.D. General, Bariatric, & Minimally Invasive Surgery [facility]

## 2015-02-03 NOTE — ED Notes (Signed)
Pt aware of urine sample needed, pt unable to go at this time. 

## 2015-02-03 NOTE — Transfer of Care (Signed)
Immediate Anesthesia Transfer of Care Note  Patient: Ashley Lewis  Procedure(s) Performed: Procedure(s): LAPAROSCOPIC CHOLECYSTECTOMY (N/A)  Patient Location: PACU  Anesthesia Type:General  Level of Consciousness: awake, alert  and oriented  Airway & Oxygen Therapy: Patient Spontanous Breathing and Patient connected to nasal cannula oxygen  Post-op Assessment: Report given to RN and Post -op Vital signs reviewed and stable  Post vital signs: Reviewed and stable  Last Vitals:  Filed Vitals:   02/03/15 1707  BP: 137/80  Pulse: 80  Temp: 36.8 C  Resp: 12    Complications: No apparent anesthesia complications

## 2015-02-03 NOTE — Anesthesia Procedure Notes (Signed)
Procedure Name: Intubation Date/Time: 02/03/2015 4:02 PM Performed by: Susa Loffler Pre-anesthesia Checklist: Patient identified, Timeout performed, Emergency Drugs available, Suction available and Patient being monitored Patient Re-evaluated:Patient Re-evaluated prior to inductionOxygen Delivery Method: Circle system utilized Preoxygenation: Pre-oxygenation with 100% oxygen Intubation Type: IV induction and Rapid sequence Laryngoscope Size: Mac and 3 Grade View: Grade II Tube type: Oral Tube size: 7.0 mm Number of attempts: 1 Airway Equipment and Method: Stylet Placement Confirmation: ETT inserted through vocal cords under direct vision,  positive ETCO2 and breath sounds checked- equal and bilateral Secured at: 22 cm Tube secured with: Tape Dental Injury: Teeth and Oropharynx as per pre-operative assessment

## 2015-02-03 NOTE — Anesthesia Preprocedure Evaluation (Signed)
Anesthesia Evaluation  Patient identified by MRN, date of birth, ID band Patient awake    Reviewed: Allergy & Precautions, H&P , NPO status , Patient's Chart, lab work & pertinent test results  History of Anesthesia Complications Negative for: history of anesthetic complications  Airway Mallampati: II  TM Distance: >3 FB Neck ROM: full    Dental no notable dental hx.    Pulmonary neg pulmonary ROS, former smoker,    Pulmonary exam normal breath sounds clear to auscultation       Cardiovascular hypertension, Pt. on medications + Peripheral Vascular Disease  Normal cardiovascular exam Rhythm:regular Rate:Normal     Neuro/Psych Depression CVA    GI/Hepatic Neg liver ROS, GERD  ,  Endo/Other  negative endocrine ROS  Renal/GU negative Renal ROS     Musculoskeletal   Abdominal   Peds  Hematology   Anesthesia Other Findings   Reproductive/Obstetrics negative OB ROS                             Anesthesia Physical Anesthesia Plan  ASA: III  Anesthesia Plan: General   Post-op Pain Management:    Induction: Intravenous  Airway Management Planned: Oral ETT  Additional Equipment: None  Intra-op Plan:   Post-operative Plan:   Informed Consent: I have reviewed the patients History and Physical, chart, labs and discussed the procedure including the risks, benefits and alternatives for the proposed anesthesia with the patient or authorized representative who has indicated his/her understanding and acceptance.   Dental Advisory Given  Plan Discussed with: Anesthesiologist, CRNA and Surgeon  Anesthesia Plan Comments:         Anesthesia Quick Evaluation

## 2015-02-03 NOTE — H&P (Signed)
Ashley Lewis [birth year]  [id number].   Chief Complaint/Reason for Consult: gallstones, abdominal pain HPI: this is a 49 yo black female who had a CVA in 2011 and only on an ASA now who has been having intermittent abdominal pain for several months.  It is in her epigastrium and RUQ and lasts for 10-15 minutes and usually dissipates.  She states food initially made it better, but has since developed some nausea and vomiting.  Yesterday her pain returned and was worse than normal. It has persisted and not gone away.  She had an appointment to see one of the surgeons but she could no longer wait.  She presented to the [facility] today where she had an [place] that revealed gallstones, but no evidence for acute cholecystitis.  All of her labs are normal.  We have been asked to see for admission.  ROS : Please see HPI, otherwise negative except history of memory loss from CVA.    Family History  Problem Relation Age of Onset  . Cancer Mother 32    breast cancer x 2  . Cancer Father 79    pancreatic    Past Medical History  Diagnosis Date  . Psoriasis   . Stroke [facility]) 2011    left thalamic infarct  . Depression     sees Dr. Josetta Huddle  . Anemia     iron defi  . Eczema   . Hyperlipidemia   . Hypertension   . GERD (gastroesophageal reflux disease)     Past Surgical History  Procedure Laterality Date  . Cervical cerclage      x 2, in 1995, 1997    Social History:  reports that she quit smoking about 4 years ago. She started smoking about 5 years ago. She does not have any smokeless tobacco history on file. She reports that she uses illicit drugs (Marijuana). She reports that she does not drink alcohol.  Allergies:  Allergies  Allergen Reactions  . Lisinopril Cough     (Not in a hospital admission)  Blood pressure 131/68, pulse 66, temperature 97.4 F (36.3 C), temperature source Oral, resp. rate 18, height _0  (1.6 m), weight 85.276 kg (188 lb), SpO2 100 %. Physical Exam: General:  pleasant, obese black female who is laying in bed in NAD HEENT: head is normocephalic, atraumatic.  Sclera are noninjected.  PERRL.  Ears and nose without any masses or lesions.  Mouth is pink and moist Heart: regular, rate, and rhythm.  Normal s1,s2. No obvious murmurs, gallops, or rubs noted.  Palpable radial and pedal pulses bilaterally Lungs: CTAB, no wheezes, rhonchi, or rales noted.  Respiratory effort nonlabored Abd: soft, minimally tender in RUQ, ND, +BS, no masses or organomegaly.  She has a small reducible umbilical hernia. MS: all 4 extremities are symmetrical with no cyanosis, clubbing, or edema. Skin: warm and dry with no masses, lesions, or rashes Psych: A&Ox3 with an appropriate affect.    Results for orders placed or performed during the hospital encounter of 02/03/15 (from the past 48 hour(s))  Lipase, blood     Status: None   Collection Time: 02/03/15 11:40 AM  Result Value Ref Range   Lipase 19 11 - 51 U/L  Comprehensive metabolic panel     Status: Abnormal   Collection Time: 02/03/15 11:40 AM  Result Value Ref Range   Sodium 139 135 - 145 mmol/L   Potassium 3.3 (L) 3.5 - 5.1 mmol/L   Chloride 105 101 - 111 mmol/L  CO2 26 22 - 32 mmol/L   Glucose, Bld 104 (H) 65 - 99 mg/dL   BUN 15 6 - 20 mg/dL   Creatinine, Ser 0.69 0.44 - 1.00 mg/dL   Calcium 9.7 8.9 - 10.3 mg/dL   Total Protein 8.6 (H) 6.5 - 8.1 g/dL   Albumin 4.2 3.5 - 5.0 g/dL   AST 26 15 - 41 U/L   ALT 31 14 - 54 U/L   Alkaline Phosphatase 47 38 - 126 U/L   Total Bilirubin 0.5 0.3 - 1.2 mg/dL   GFR calc non Af Amer >60 >60 mL/min   GFR calc Af Amer >60 >60 mL/min    Comment: (NOTE) The eGFR has been calculated using the CKD EPI equation. This calculation has not been validated in all clinical situations. eGFR's persistently <60 mL/min signify possible Chronic Kidney Disease.    Anion gap 8 5 - 15  CBC     Status: None   Collection Time: 02/03/15 11:40 AM  Result Value Ref Range   WBC 7.8 4.0 -  10.5 K/uL   RBC 4.82 3.87 - 5.11 MIL/uL   Hemoglobin 13.1 12.0 - 15.0 g/dL   HCT 39.5 36.0 - 46.0 %   MCV 82.0 78.0 - 100.0 fL   MCH 27.2 26.0 - 34.0 pg   MCHC 33.2 30.0 - 36.0 g/dL   RDW 12.9 11.5 - 15.5 %   Platelets 246 150 - 400 K/uL   US Abdomen Limited  02/03/2015  CLINICAL DATA:  Six-month history of right upper quadrant pain with nausea and vomiting EXAM: US ABDOMEN LIMITED - RIGHT UPPER QUADRANT COMPARISON:  January 17, 2015 FINDINGS: Gallbladder: Within the gallbladder, there are multiple echogenic foci which move and shadow consistent with cholelithiasis. Largest individual gallstone measures 1.0 cm in length. There is no gallbladder wall thickening or pericholecystic fluid. No sonographic Murphy sign noted. Common bile duct: Diameter: 3 mm. There is no intrahepatic or extrahepatic biliary duct dilatation. Liver: There is a cyst in the right node measuring 5 x 5 x 5 mm anteriorly. A second cyst in the right lobe measures 6 x 5 x 7 mm. No larger mass lesions. The overall echotexture of the liver suggests a degree of underlying hepatic steatosis. IMPRESSION: Cholelithiasis without gallbladder wall thickening or pericholecystic fluid. Small cysts in liver. Probable degree of hepatic steatosis. No biliary duct dilatation. Electronically Signed   By: Lowella Grip III M.D.   On: 02/03/2015 13:48       Assessment/Plan 1. Biliary colic -admit and take to OR for lap chole.  The procedure, risks, complications, and expected outcome was d/w the patient.  She understands and is agreeable to proceed. -Rocephin on call to OR -NPO 2. HTN -resume antihypertensives post op 3. RLS -resume gabapentin post op 4. DVT prophylaxis -SCDs/ Lovenox post op starting tomorrow.  Jasiel Belisle E 02/03/2015, 3:10 PM Pager: [phone]

## 2015-02-03 NOTE — ED Notes (Signed)
Dr. Yao at bedside. 

## 2015-02-03 NOTE — Anesthesia Postprocedure Evaluation (Signed)
  Anesthesia Post-op Note  Patient: Ashley Lewis  Procedure(s) Performed: Procedure(s): LAPAROSCOPIC CHOLECYSTECTOMY (N/A)  Patient Location: PACU  Anesthesia Type:General  Level of Consciousness: awake, sedated and patient cooperative  Airway and Oxygen Therapy: Patient Spontanous Breathing  Post-op Pain: mild  Post-op Assessment: Post-op Vital signs reviewed              Post-op Vital Signs: stable  Last Vitals:  Filed Vitals:   02/03/15 1745  BP: 125/70  Pulse: 66  Temp:   Resp: 10    Complications: No apparent anesthesia complications

## 2015-02-03 NOTE — ED Provider Notes (Signed)
CSN: [id number]     Arrival date & time 02/03/15  1048 History   First MD Initiated Contact with Patient 02/03/15 1122     Chief Complaint  Patient presents with  . Abdominal Pain     (Consider location/radiation/quality/duration/timing/severity/associated sxs/prior Treatment) The history is provided by the patient.  ASSETOU KALB is a 49 y.o. female hx of psoriasis, HTN, GERD here with RUQ pain. Patient has intermittent right upper quadrant pain for the last several weeks. Patient states that pain is worse after eating and she has some anorexia associated with it. Patient had an ultrasound done about 2 weeks ago that showed cholelithiasis. However, over the last 2 days she has severe pain that is constant. She was unable to eat anything because of the pain. Had several episodes of vomiting. Denies any fevers or chills. She is supposed to see surgery next week but has not seen them yet.   Past Medical History  Diagnosis Date  . Psoriasis   . Stroke [facility]) 2011    left thalamic infarct  . Depression     sees Dr. Josetta Huddle  . Anemia     iron defi  . Eczema   . Hyperlipidemia   . Hypertension   . GERD (gastroesophageal reflux disease)    Past Surgical History  Procedure Laterality Date  . Cervical cerclage      x 2, in 1995, 1997   Family History  Problem Relation Age of Onset  . Cancer Mother 38    breast cancer x 2  . Cancer Father 73    pancreatic   Social History  Substance Use Topics  . Smoking status: Former Smoker -- 0.50 packs/day    Start date: 03/25/2009    Quit date: 04/14/2010  . Smokeless tobacco: None  . Alcohol Use: No   OB History    No data available     Review of Systems  Gastrointestinal: Positive for abdominal pain.  All other systems reviewed and are negative.     Allergies  Lisinopril  Home Medications   Prior to Admission medications   Medication Sig Start Date End Date Taking? Authorizing Provider  amoxicillin (AMOXIL) 500 MG  capsule Take 500 mg by mouth every 12 (twelve) hours. 01/27/15  Yes Historical Provider, MD  aspirin EC 81 MG tablet Take 81 mg by mouth daily.   Yes Historical Provider, MD  clarithromycin (BIAXIN) 500 MG tablet 1 TABLET EVERY 12 HRS ORALLY 14 DAYS 01/27/15  Yes Historical Provider, MD  gabapentin (NEURONTIN) 300 MG capsule Take 600 mg by mouth 3 (three) times daily. 03/07/14  Yes Historical Provider, MD  lansoprazole (PREVACID) 30 MG capsule Take 30 mg by mouth 2 (two) times daily. 01/27/15  Yes Historical Provider, MD  losartan-hydrochlorothiazide (HYZAAR) 50-12.5 MG tablet Take 1 tablet by mouth daily. 12/02/14  Yes Historical Provider, MD  omeprazole (PRILOSEC) 20 MG capsule Take 20 mg by mouth daily.   Yes Historical Provider, MD  hydrochlorothiazide (HYDRODIURIL) 25 MG tablet Take 1 tablet (25 mg total) by mouth daily. Patient not taking: Reported on 02/03/2015 06/28/13   Virgel Manifold, MD   BP 131/68 mmHg  Pulse 66  Temp(Src) 97.4 F (36.3 C) (Oral)  Resp 18  Ht 5\' 3"  (1.6 m)  Wt 188 lb (85.276 kg)  BMI 33.31 kg/m2  SpO2 100% Physical Exam  Constitutional: She is oriented to person, place, and time.  Uncomfortable, crunched up in pain   HENT:  Head: Normocephalic.  MM dry  Eyes: Conjunctivae are normal. Pupils are equal, round, and reactive to light.  Neck: Normal range of motion. Neck supple.  Cardiovascular: Normal rate, regular rhythm and normal heart sounds.   Pulmonary/Chest: Effort normal and breath sounds normal. No respiratory distress. She has no wheezes. She has no rales.  Abdominal: Soft.  + RUQ tenderness, + murphy sign   Musculoskeletal: Normal range of motion. She exhibits no edema or tenderness.  Neurological: She is alert and oriented to person, place, and time. No cranial nerve deficit. Coordination normal.  Skin: Skin is warm and dry.  Psychiatric: She has a normal mood and affect. Her behavior is normal. Judgment and thought content normal.  Nursing note and  vitals reviewed.   ED Course  Procedures (including critical care time) Labs Review Labs Reviewed  COMPREHENSIVE METABOLIC PANEL - Abnormal; Notable for the following:    Potassium 3.3 (*)    Glucose, Bld 104 (*)    Total Protein 8.6 (*)    All other components within normal limits  LIPASE, BLOOD  CBC  URINALYSIS, ROUTINE W REFLEX MICROSCOPIC (NOT AT [facility])    Imaging Review US Abdomen Limited  02/03/2015  CLINICAL DATA:  Six-month history of right upper quadrant pain with nausea and vomiting EXAM: US ABDOMEN LIMITED - RIGHT UPPER QUADRANT COMPARISON:  January 17, 2015 FINDINGS: Gallbladder: Within the gallbladder, there are multiple echogenic foci which move and shadow consistent with cholelithiasis. Largest individual gallstone measures 1.0 cm in length. There is no gallbladder wall thickening or pericholecystic fluid. No sonographic Murphy sign noted. Common bile duct: Diameter: 3 mm. There is no intrahepatic or extrahepatic biliary duct dilatation. Liver: There is a cyst in the right node measuring 5 x 5 x 5 mm anteriorly. A second cyst in the right lobe measures 6 x 5 x 7 mm. No larger mass lesions. The overall echotexture of the liver suggests a degree of underlying hepatic steatosis. IMPRESSION: Cholelithiasis without gallbladder wall thickening or pericholecystic fluid. Small cysts in liver. Probable degree of hepatic steatosis. No biliary duct dilatation. Electronically Signed   By: Lowella Grip III M.D.   On: 02/03/2015 13:48   I have personally reviewed and evaluated these images and lab results as part of my medical decision-making.   EKG Interpretation None      MDM   Final diagnoses:  None   Emyia A Forte is a 49 y.o. female here with RUQ pain. Likely symptomatic chole vs acute cholecystitis. Will get LFTs, CBC, repeat US. ]  2:20 pm LFTs nl. Repeat US showed cholelithiasis but no acute chole. Pain improved initially but then came back and was doubled over.  Given second dose of morphine. Consulted surgery.   2:45 PM Surgery to admit for symptomatic cholelithiasis.     Wandra Arthurs, MD 02/03/15 [phone]

## 2015-02-04 DIAGNOSIS — I10 Essential (primary) hypertension: Secondary | ICD-10-CM | POA: Diagnosis not present

## 2015-02-04 DIAGNOSIS — E785 Hyperlipidemia, unspecified: Secondary | ICD-10-CM | POA: Diagnosis not present

## 2015-02-04 DIAGNOSIS — Z87891 Personal history of nicotine dependence: Secondary | ICD-10-CM | POA: Diagnosis not present

## 2015-02-04 DIAGNOSIS — K8010 Calculus of gallbladder with chronic cholecystitis without obstruction: Secondary | ICD-10-CM | POA: Diagnosis not present

## 2015-02-04 DIAGNOSIS — Z8673 Personal history of transient ischemic attack (TIA), and cerebral infarction without residual deficits: Secondary | ICD-10-CM | POA: Diagnosis not present

## 2015-02-04 DIAGNOSIS — B9681 Helicobacter pylori [H. pylori] as the cause of diseases classified elsewhere: Secondary | ICD-10-CM | POA: Diagnosis not present

## 2015-02-04 DIAGNOSIS — K219 Gastro-esophageal reflux disease without esophagitis: Secondary | ICD-10-CM | POA: Diagnosis not present

## 2015-02-04 DIAGNOSIS — G2581 Restless legs syndrome: Secondary | ICD-10-CM | POA: Diagnosis not present

## 2015-02-04 MED ORDER — IBUPROFEN 600 MG PO TABS: 600.0000 mg | ORAL_TABLET | Freq: Four times a day (QID) | ORAL | Status: DC | PRN

## 2015-02-04 NOTE — Progress Notes (Addendum)
1 Day Post-Op  Subjective: Very sore, says she had the same pain yesterday after surgery that she had preop.  Vomited yesterday and very anxious about going home.   She has a 49y/o handicapped daughter she is primary care provider for.  He sites look fine and she did well with clears.    Objective: Vital signs in last 24 hours: Temp:  [97.4 F (36.3 C)-98.5 F (36.9 C)] 97.9 F (36.6 C) (11/12 0651) Pulse Rate:  [63-85] 75 (11/12 0651) Resp:  [10-18] 16 (11/12 0651) BP: (110-147)/(58-97) 117/61 mmHg (11/12 0651) SpO2:  [98 %-100 %] 100 % (11/12 0651) Weight:  [85.27 kg (187 lb 15.8 oz)-85.276 kg (188 lb)] 85.27 kg (187 lb 15.8 oz) (11/11 1812) Last BM Date: 02/03/15 220 PO 1450 urine Afebrile,VSS WBC 11.4 No IOC Intake/Output from previous day: 11/11 0701 - 11/12 0700 In: 1583.8 [P.O.:220; I.V.:1363.8] Out: 1625 [Urine:1450; Emesis/NG output:150; Blood:25] Intake/Output this shift:    General appearance: alert, cooperative, no distress and anxious about going home. Abdomen:  Soft, tender, sites look fine.    Lab Results:   Recent Labs  02/03/15 1140 02/03/15 2004  WBC 7.8 11.4*  HGB 13.1 11.5*  HCT 39.5 35.3*  PLT 246 199    BMET  Recent Labs  02/03/15 1140 02/03/15 2004  NA 139  --   K 3.3*  --   CL 105  --   CO2 26  --   GLUCOSE 104*  --   BUN 15  --   CREATININE 0.69 0.74  CALCIUM 9.7  --    PT/INR No results for input(s): LABPROT, INR in the last 72 hours.   Recent Labs Lab 02/03/15 1140  AST 26  ALT 31  ALKPHOS 47  BILITOT 0.5  PROT 8.6*  ALBUMIN 4.2     Lipase     Component Value Date/Time   LIPASE 19 02/03/2015 1140     Studies/Results: US Abdomen Limited  02/03/2015  CLINICAL DATA:  Six-month history of right upper quadrant pain with nausea and vomiting EXAM: US ABDOMEN LIMITED - RIGHT UPPER QUADRANT COMPARISON:  January 17, 2015 FINDINGS: Gallbladder: Within the gallbladder, there are multiple echogenic foci which move and  shadow consistent with cholelithiasis. Largest individual gallstone measures 1.0 cm in length. There is no gallbladder wall thickening or pericholecystic fluid. No sonographic Murphy sign noted. Common bile duct: Diameter: 3 mm. There is no intrahepatic or extrahepatic biliary duct dilatation. Liver: There is a cyst in the right node measuring 5 x 5 x 5 mm anteriorly. A second cyst in the right lobe measures 6 x 5 x 7 mm. No larger mass lesions. The overall echotexture of the liver suggests a degree of underlying hepatic steatosis. IMPRESSION: Cholelithiasis without gallbladder wall thickening or pericholecystic fluid. Small cysts in liver. Probable degree of hepatic steatosis. No biliary duct dilatation. Electronically Signed   By: Lowella Grip III M.D.   On: 02/03/2015 13:48    Medications: . amoxicillin  500 mg Oral Q12H  . aspirin EC  81 mg Oral Daily  . clarithromycin  500 mg Oral Q12H  . enoxaparin (LOVENOX) injection  40 mg Subcutaneous Q24H  . gabapentin  600 mg Oral TID  . losartan  50 mg Oral Daily   And  . hydrochlorothiazide  12.5 mg Oral Daily  . pantoprazole  40 mg Oral Daily    Assessment/Plan Chronic cholecystitis  S/p laparoscopic cholecystectomy, 02/03/15, Dr. 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  Continue the BP and H pylori rx.    Recheck labs in AM.   She thinks Ibuprofen messes up her BP.  Stick with tylenol and oxycodone for pain.    Mullaly,WILLARD 02/04/2015  Agree with above. Mother in room. She is having the same pain that she had before surgery.  This may take some time to resolve.  Alphonsa Overall, MD, [facility] Pager: [phone] Office phone:  [phone]

## 2015-02-04 NOTE — Progress Notes (Signed)
Patient c/o of severe sharp mid  abdominal pain  9/10, she stated that pain is the same as when she was admitted.Morphine 4 mg IV given. Will monitor.

## 2015-02-05 DIAGNOSIS — K219 Gastro-esophageal reflux disease without esophagitis: Secondary | ICD-10-CM | POA: Diagnosis not present

## 2015-02-05 DIAGNOSIS — I10 Essential (primary) hypertension: Secondary | ICD-10-CM | POA: Diagnosis not present

## 2015-02-05 DIAGNOSIS — Z87891 Personal history of nicotine dependence: Secondary | ICD-10-CM | POA: Diagnosis not present

## 2015-02-05 DIAGNOSIS — B9681 Helicobacter pylori [H. pylori] as the cause of diseases classified elsewhere: Secondary | ICD-10-CM | POA: Diagnosis not present

## 2015-02-05 DIAGNOSIS — E785 Hyperlipidemia, unspecified: Secondary | ICD-10-CM | POA: Diagnosis not present

## 2015-02-05 DIAGNOSIS — G2581 Restless legs syndrome: Secondary | ICD-10-CM | POA: Diagnosis not present

## 2015-02-05 DIAGNOSIS — Z8673 Personal history of transient ischemic attack (TIA), and cerebral infarction without residual deficits: Secondary | ICD-10-CM | POA: Diagnosis not present

## 2015-02-05 DIAGNOSIS — K8010 Calculus of gallbladder with chronic cholecystitis without obstruction: Secondary | ICD-10-CM | POA: Diagnosis not present

## 2015-02-05 LAB — COMPREHENSIVE METABOLIC PANEL
ALK PHOS: 37 U/L — AB (ref 38–126)
ALT: 80 U/L — AB (ref 14–54)
ANION GAP: 8 (ref 5–15)
AST: 61 U/L — ABNORMAL HIGH (ref 15–41)
Albumin: 3.3 g/dL — ABNORMAL LOW (ref 3.5–5.0)
BILIRUBIN TOTAL: 0.3 mg/dL (ref 0.3–1.2)
BUN: 6 mg/dL (ref 6–20)
CALCIUM: 9 mg/dL (ref 8.9–10.3)
CO2: 28 mmol/L (ref 22–32)
CREATININE: 0.86 mg/dL (ref 0.44–1.00)
Chloride: 104 mmol/L (ref 101–111)
Glucose, Bld: 97 mg/dL (ref 65–99)
Potassium: 3.6 mmol/L (ref 3.5–5.1)
SODIUM: 140 mmol/L (ref 135–145)
TOTAL PROTEIN: 7.1 g/dL (ref 6.5–8.1)

## 2015-02-05 LAB — CBC
HCT: 35.6 % — ABNORMAL LOW (ref 36.0–46.0)
HEMOGLOBIN: 11.3 g/dL — AB (ref 12.0–15.0)
MCH: 27 pg (ref 26.0–34.0)
MCHC: 31.7 g/dL (ref 30.0–36.0)
MCV: 85 fL (ref 78.0–100.0)
Platelets: 195 10*3/uL (ref 150–400)
RBC: 4.19 MIL/uL (ref 3.87–5.11)
RDW: 13.4 % (ref 11.5–15.5)
WBC: 9.6 10*3/uL (ref 4.0–10.5)

## 2015-02-05 MED ORDER — ALUM & MAG HYDROXIDE-SIMETH 200-200-20 MG/5ML PO SUSP
30.0000 mL | Freq: Four times a day (QID) | ORAL | Status: DC | PRN
  Filled 2015-02-05: qty 30

## 2015-02-05 MED ORDER — ACETAMINOPHEN 325 MG PO TABS: 650.0000 mg | ORAL_TABLET | Freq: Four times a day (QID) | ORAL | Status: DC | PRN

## 2015-02-05 MED ORDER — WHITE PETROLATUM GEL
Status: AC
  Filled 2015-02-05: qty 1

## 2015-02-05 MED ORDER — OXYCODONE HCL 5 MG PO TABS: 5.0000 mg | ORAL_TABLET | ORAL | Status: DC | PRN

## 2015-02-05 NOTE — Progress Notes (Signed)
Pt ready for DC.  Copy DC instructions given and explained.

## 2015-02-05 NOTE — Discharge Instructions (Signed)
Laparoscopic Cholecystectomy, Care After °Refer to this sheet in the next few weeks. These instructions provide you with information about caring for yourself after your procedure. Your health care provider may also give you more specific instructions. Your treatment has been planned according to current medical practices, but problems sometimes occur. Call your health care provider if you have any problems or questions after your procedure. °WHAT TO EXPECT AFTER THE PROCEDURE °After your procedure, it is common to have: °· Pain at your incision sites. You will be given pain medicines to control your pain. °· Mild nausea or vomiting. This should improve after the first 24 hours. °· Bloating and possible shoulder pain from the gas that was used during the procedure. This will improve after the first 24 hours. °HOME CARE INSTRUCTIONS °Incision Care °· Follow instructions from your health care provider about how to take care of your incisions. Make sure you: °¨ Wash your hands with soap and water before you change your bandage (dressing). If soap and water are not available, use hand sanitizer. °¨ Change your dressing as told by your health care provider. °¨ Leave stitches (sutures), skin glue, or adhesive strips in place. These skin closures may need to be in place for 2 weeks or longer. If adhesive strip edges start to loosen and curl up, you may trim the loose edges. Do not remove adhesive strips completely unless your health care provider tells you to do that. °· Do not take baths, swim, or use a hot tub until your health care provider approves. Ask your health care provider if you can take showers. You may only be allowed to take sponge baths for bathing. °General Instructions °· Take over-the-counter and prescription medicines only as told by your health care provider. °· Do not drive or operate heavy machinery while taking prescription pain medicine. °· Return to your normal diet as told by your health care  provider. °· Do not lift anything that is heavier than 10 lb (4.5 kg). °· Do not play contact sports for one week or until your health care provider approves. °SEEK MEDICAL CARE IF:  °· You have redness, swelling, or pain at the site of your incision. °· You have fluid, blood, or pus coming from your incision. °· You notice a bad smell coming from your incision area. °· Your surgical incisions break open. °· You have a fever. °SEEK IMMEDIATE MEDICAL CARE IF: °· You develop a rash. °· You have difficulty breathing. °· You have chest pain. °· You have increasing pain in your shoulders (shoulder strap areas). °· You faint or have dizzy episodes while you are standing. °· You have severe pain in your abdomen. °· You have nausea or vomiting that lasts for more than one day. °  °This information is not intended to replace advice given to you by your health care provider. Make sure you discuss any questions you have with your health care provider. °  °Document Released: 03/11/2005 Document Revised: 11/30/2014 Document Reviewed: 10/21/2012 °Elsevier Interactive Patient Education ©2016 [employer] °CCS ______[facility] °LAPAROSCOPIC SURGERY: POST OP INSTRUCTIONS °Always review your discharge instruction sheet given to you by the facility where your surgery was performed. °IF YOU HAVE DISABILITY OR FAMILY LEAVE FORMS, YOU MUST BRING THEM TO THE OFFICE FOR PROCESSING.   °DO NOT GIVE THEM TO YOUR DOCTOR. ° °1. A prescription for pain medication may be given to you upon discharge.  Take your pain medication as prescribed, if needed.  If narcotic   pain medicine is not needed, then you may take acetaminophen (Tylenol) or ibuprofen (Advil) as needed. °2. Take your usually prescribed medications unless otherwise directed. °3. If you need a refill on your pain medication, please contact your pharmacy.  They will contact our office to request authorization. Prescriptions will not be filled after 5pm or on  week-ends. °4. You should follow a light diet the first few days after arrival home, such as soup and crackers, etc.  Be sure to include lots of fluids daily. °5. Most patients will experience some swelling and bruising in the area of the incisions.  Ice packs will help.  Swelling and bruising can take several days to resolve.  °6. It is common to experience some constipation if taking pain medication after surgery.  Increasing fluid intake and taking a stool softener (such as Colace) will usually help or prevent this problem from occurring.  A mild laxative (Milk of Magnesia or Miralax) should be taken according to package instructions if there are no bowel movements after 48 hours. °7. Unless discharge instructions indicate otherwise, you may remove your bandages 24-48 hours after surgery, and you may shower at that time.  You may have steri-strips (small skin tapes) in place directly over the incision.  These strips should be left on the skin for 7-10 days.  If your surgeon used skin glue on the incision, you may shower in 24 hours.  The glue will flake off over the next 2-3 weeks.  Any sutures or staples will be removed at the office during your follow-up visit. °8. ACTIVITIES:  You may resume regular (light) daily activities beginning the next day--such as daily self-care, walking, climbing stairs--gradually increasing activities as tolerated.  You may have sexual intercourse when it is comfortable.  Refrain from any heavy lifting or straining until approved by your doctor. °a. You may drive when you are no longer taking prescription pain medication, you can comfortably wear a seatbelt, and you can safely maneuver your car and apply brakes. °b. RETURN TO WORK:  __________________________________________________________ °9. You should see your doctor in the office for a follow-up appointment approximately 2-3 weeks after your surgery.  Make sure that you call for this appointment within a day or two after you  arrive home to insure a convenient appointment time. °10. OTHER INSTRUCTIONS: __________________________________________________________________________________________________________________________ __________________________________________________________________________________________________________________________ °WHEN TO CALL YOUR DOCTOR: °1. Fever over 101.0 °2. Inability to urinate °3. Continued bleeding from incision. °4. Increased pain, redness, or drainage from the incision. °5. Increasing abdominal pain ° °The clinic staff is available to answer your questions during regular business hours.  Please don’t hesitate to call and ask to speak to one of the nurses for clinical concerns.  If you have a medical emergency, go to the nearest emergency room or call 911.  A surgeon from [facility] is always on call at the hospital. °[address], [address], [place], [place]  [address] ? [address] [address], [place], [place]   [address] °[phone] ? [phone] ? FAX [phone] °Web site: [email or web address] ° °

## 2015-02-05 NOTE — Progress Notes (Signed)
2 Days Post-Op  Subjective: Better complaining of some epigastric pain after eating. She has some issues with GERD at home also.  On PPI at home.  Overall much better.  Objective: Vital signs in last 24 hours: Temp:  [97.8 F (36.6 C)-98.4 F (36.9 C)] 98.4 F (36.9 C) (11/13 0622) Pulse Rate:  [68-72] 72 (11/13 0622) Resp:  [14-18] 17 (11/13 0622) BP: (121-133)/(64-85) 133/85 mmHg (11/13 0622) SpO2:  [99 %-100 %] 100 % (11/13 0622) Last BM Date: 02/03/15 1190 PO recorded 400 urine recorded Afebrile, VSS CBC looks good WBC down to normal LFT's OK, AST/ALT up marginally Intake/Output from previous day: 11/12 0701 - 11/13 0700 In: [id number] [P.O.:1190; I.V.:253] Out: 400 [Urine:400] Intake/Output this shift: Total I/O In: 220 [P.O.:220] Out: 400 [Urine:400]  General appearance: alert, cooperative and no distress Resp: clear to auscultation bilaterally GI: soft, sore, complaining of epigastric pain.  will try some maalox.mobilize more and see how she does.  Lab Results:   Recent Labs  02/03/15 2004 02/05/15 0151  WBC 11.4* 9.6  HGB 11.5* 11.3*  HCT 35.3* 35.6*  PLT 199 195    BMET  Recent Labs  02/03/15 1140 02/03/15 2004 02/05/15 0151  NA 139  --  140  K 3.3*  --  3.6  CL 105  --  104  CO2 26  --  28  GLUCOSE 104*  --  97  BUN 15  --  6  CREATININE 0.69 0.74 0.86  CALCIUM 9.7  --  9.0   PT/INR No results for input(s): LABPROT, INR in the last 72 hours.   Recent Labs Lab 02/03/15 1140 02/05/15 0151  AST 26 61*  ALT 31 80*  ALKPHOS 47 37*  BILITOT 0.5 0.3  PROT 8.6* 7.1  ALBUMIN 4.2 3.3*     Lipase     Component Value Date/Time   LIPASE 19 02/03/2015 1140     Studies/Results: US Abdomen Limited  02/03/2015  CLINICAL DATA:  Six-month history of right upper quadrant pain with nausea and vomiting EXAM: US ABDOMEN LIMITED - RIGHT UPPER QUADRANT COMPARISON:  January 17, 2015 FINDINGS: Gallbladder: Within the gallbladder, there are multiple  echogenic foci which move and shadow consistent with cholelithiasis. Largest individual gallstone measures 1.0 cm in length. There is no gallbladder wall thickening or pericholecystic fluid. No sonographic Murphy sign noted. Common bile duct: Diameter: 3 mm. There is no intrahepatic or extrahepatic biliary duct dilatation. Liver: There is a cyst in the right node measuring 5 x 5 x 5 mm anteriorly. A second cyst in the right lobe measures 6 x 5 x 7 mm. No larger mass lesions. The overall echotexture of the liver suggests a degree of underlying hepatic steatosis. IMPRESSION: Cholelithiasis without gallbladder wall thickening or pericholecystic fluid. Small cysts in liver. Probable degree of hepatic steatosis. No biliary duct dilatation. Electronically Signed   By: Lowella Grip III M.D.   On: 02/03/2015 13:48    Medications: . amoxicillin  500 mg Oral Q12H  . aspirin EC  81 mg Oral Daily  . clarithromycin  500 mg Oral Q12H  . enoxaparin (LOVENOX) injection  40 mg Subcutaneous Q24H  . gabapentin  600 mg Oral TID  . losartan  50 mg Oral Daily   And  . hydrochlorothiazide  12.5 mg Oral Daily  . pantoprazole  40 mg Oral Daily  . white petrolatum        Assessment/Plan Chronic cholecystitis S/p laparoscopic cholecystectomy, 02/03/15, Dr. 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

## 2015-02-06 ENCOUNTER — Encounter (HOSPITAL_COMMUNITY): Payer: Self-pay | Admitting: General Surgery

## 2015-02-06 NOTE — Discharge Summary (Signed)
Physician Discharge Summary  Patient ID: Ashley Lewis MRN: [id number] DOB/AGE: May 09, 1965 49 y.o.  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 Carlus Lewis Course: this is a 49 yo black female who had a CVA in 2011 and only on an ASA now who has been having intermittent abdominal pain for several months. It is in her epigastrium and RUQ and lasts for 10-15 minutes and usually dissipates. She states food initially made it better, but has since developed some nausea and vomiting. Yesterday her pain returned and was worse than normal. It has persisted and not gone away. She had an appointment to see one of the surgeons but she could no longer wait. She presented to the [facility] today where she had an [place] that revealed gallstones, but no evidence for acute cholecystitis. All of her labs are normal. We have been asked to see for admission. She was admitted on 02/03/15 and taken to the OR that day.  She had no issues with her surgery.  She had ongoing pain, some nausea and was kept in the hospital another 24 hours till POD 2, for discharge.  She was seen that AM by Dr. Rosendo Lewis and it was his opinion pt could go home.  Follow up as below.  Pt tolerating a diet and pain was much improved.  She was kept on her H Pylori and other preop medicines.    Condition on d/c:  Improved      Disposition: 01-Home or Self Care     Medication List    TAKE these medications        acetaminophen 325 MG tablet  Commonly known as:  TYLENOL  Take 2 tablets (650 mg total) by mouth every 6 (six) hours as needed for mild pain (or temp > 100).     amoxicillin 500 MG capsule  Commonly  known as:  AMOXIL  Take 500 mg by mouth every 12 (twelve) hours.     aspirin EC 81 MG tablet  Take 81 mg by mouth daily.     clarithromycin 500 MG tablet  Commonly known as:  BIAXIN  1 TABLET EVERY 12 HRS ORALLY 14 DAYS     lansoprazole 30 MG capsule  Commonly known as:  PREVACID  Take 30 mg by mouth 2 (two) times daily.     losartan-hydrochlorothiazide 50-12.5 MG tablet  Commonly known as:  HYZAAR  Take 1 tablet by mouth daily.     NEURONTIN 300 MG capsule  Generic drug:  gabapentin  Take 600 mg by mouth 3 (three) times daily.     omeprazole 20 MG capsule  Commonly known as:  PRILOSEC  Take 20 mg by mouth daily.     oxyCODONE 5 MG immediate release tablet  Commonly known as:  Oxy IR/ROXICODONE  Take 1-2 tablets (5-10 mg total) by mouth every 4 (four) hours as needed for moderate pain.           Follow-up Information    Follow up with [facility].   Specialty:  General Surgery   Why:  The office should call you on Monday with follow up appointment. If they do not call Tuesday and ask for follow up 3 weeks  in the [facility].     Contact information:   [address]  [place] [address] [phone]       Follow up with Ashley Needle, MD.   Specialty:  Family Medicine   Why:  Call and make a follow up appoinment for medical issues.  Let them know you have had surgery.Minette Brine information:   [place] [address] [phone]       Signed: [name], Ashley Lewis 02/06/2015, 3:50 PM

## 2015-02-21 DIAGNOSIS — K915 Postcholecystectomy syndrome: Secondary | ICD-10-CM | POA: Diagnosis not present

## 2015-02-21 DIAGNOSIS — Z9049 Acquired absence of other specified parts of digestive tract: Secondary | ICD-10-CM | POA: Diagnosis not present

## 2015-02-21 DIAGNOSIS — I10 Essential (primary) hypertension: Secondary | ICD-10-CM | POA: Diagnosis not present

## 2015-02-21 DIAGNOSIS — I6990 Unspecified sequelae of unspecified cerebrovascular disease: Secondary | ICD-10-CM | POA: Diagnosis not present

## 2015-02-21 DIAGNOSIS — K219 Gastro-esophageal reflux disease without esophagitis: Secondary | ICD-10-CM | POA: Diagnosis not present

## 2015-02-21 DIAGNOSIS — R768 Other specified abnormal immunological findings in serum: Secondary | ICD-10-CM | POA: Diagnosis not present

## 2015-02-21 DIAGNOSIS — E7800 Pure hypercholesterolemia, unspecified: Secondary | ICD-10-CM | POA: Diagnosis not present

## 2015-02-21 DIAGNOSIS — G629 Polyneuropathy, unspecified: Secondary | ICD-10-CM | POA: Diagnosis not present

## 2015-03-06 DIAGNOSIS — R768 Other specified abnormal immunological findings in serum: Secondary | ICD-10-CM | POA: Diagnosis not present

## 2015-03-30 DIAGNOSIS — H5210 Myopia, unspecified eye: Secondary | ICD-10-CM | POA: Diagnosis not present

## 2015-03-30 DIAGNOSIS — H5213 Myopia, bilateral: Secondary | ICD-10-CM | POA: Diagnosis not present

## 2015-04-27 DIAGNOSIS — I10 Essential (primary) hypertension: Secondary | ICD-10-CM | POA: Diagnosis not present

## 2015-04-27 DIAGNOSIS — K915 Postcholecystectomy syndrome: Secondary | ICD-10-CM | POA: Diagnosis not present

## 2015-04-27 DIAGNOSIS — J039 Acute tonsillitis, unspecified: Secondary | ICD-10-CM | POA: Diagnosis not present

## 2015-04-27 DIAGNOSIS — I6990 Unspecified sequelae of unspecified cerebrovascular disease: Secondary | ICD-10-CM | POA: Diagnosis not present

## 2015-04-27 DIAGNOSIS — E785 Hyperlipidemia, unspecified: Secondary | ICD-10-CM | POA: Diagnosis not present

## 2015-04-27 DIAGNOSIS — R0789 Other chest pain: Secondary | ICD-10-CM | POA: Diagnosis not present

## 2015-04-27 DIAGNOSIS — K219 Gastro-esophageal reflux disease without esophagitis: Secondary | ICD-10-CM | POA: Diagnosis not present

## 2015-05-18 DIAGNOSIS — H52209 Unspecified astigmatism, unspecified eye: Secondary | ICD-10-CM | POA: Diagnosis not present

## 2015-05-18 DIAGNOSIS — H524 Presbyopia: Secondary | ICD-10-CM | POA: Diagnosis not present

## 2015-05-18 DIAGNOSIS — H5213 Myopia, bilateral: Secondary | ICD-10-CM | POA: Diagnosis not present

## 2015-05-18 DIAGNOSIS — Z0100 Encounter for examination of eyes and vision without abnormal findings: Secondary | ICD-10-CM | POA: Diagnosis not present

## 2015-05-26 ENCOUNTER — Ambulatory Visit (INDEPENDENT_AMBULATORY_CARE_PROVIDER_SITE_OTHER): Payer: Commercial Managed Care - HMO | Admitting: Cardiovascular Disease

## 2015-05-26 ENCOUNTER — Encounter: Payer: Self-pay | Admitting: Cardiovascular Disease

## 2015-05-26 VITALS — BP 116/82 | HR 84 | Ht 63.0 in | Wt 175.2 lb

## 2015-05-26 DIAGNOSIS — E785 Hyperlipidemia, unspecified: Secondary | ICD-10-CM | POA: Diagnosis not present

## 2015-05-26 DIAGNOSIS — R0789 Other chest pain: Secondary | ICD-10-CM | POA: Diagnosis not present

## 2015-05-26 DIAGNOSIS — I10 Essential (primary) hypertension: Secondary | ICD-10-CM | POA: Diagnosis not present

## 2015-05-26 MED ORDER — IBUPROFEN 800 MG PO TABS: 800.0000 mg | ORAL_TABLET | Freq: Three times a day (TID) | ORAL | Status: DC

## 2015-05-26 NOTE — Patient Instructions (Signed)
Dr Oval Linsey has recommended making the following medication changes: TAKE Ibuprofen 800 mg - take 1 tablet by mouth three times daily for 1 week  CALL THE OFFICE TO SPEAK WITH A NURSE IF YOU CONTINUE TO HAVE CHEST PAIN  Dr Oval Linsey recommends that you schedule a follow-up appointment in 1 year. You will receive a reminder letter in the mail two months in advance. If you don't receive a letter, please call our office to schedule the follow-up appointment.  If you need a refill on your cardiac medications before your next appointment, please call your pharmacy.

## 2015-05-26 NOTE — Progress Notes (Signed)
Cardiology Office Note   Date:  [id number]   ID:  Ashley Lewis Lewis, DOB [birth year], MRN [id number]  PCP:  Ashley Lewis Harada, MD  Cardiologist:   Ashley Lewis Harness, MD   Chief Complaint  Patient presents with  . New Patient (Initial Visit)    has had chest pain and sweating X while, no shortness of breath, occassinal edema with extensive standing, has pain or cramping in legs, occassional lightheadedness or dizziness, has fatigue      History of Present Illness: Ashley Lewis Lewis is a 49 y.o. female ith hypertension, prior stroke, tobacco abusewho presents for an evaluation of chest pain.  Ashley Lewis Lewis reports R sided chest pain and shoulder pain that has been ongoing for months.  The pain ranges from 1-8/10 in severity.  It feels like something is shifting in her sternum.  The pain is constantly dull and intermittently sharp.  It feels heavy when she leans over.  There is no associated shortness of breath, nausea or vomiting.  She has noted diaphoresis that she associates with both anxiety and menopause.  She cannot breathe without a fan in the room at night.  The pain does not change with exertion.  She does not get much formal exercise but she has been watching a friend's dog.  For the last weeks he has been walking the dog and denies any chest pain or shortness of breath with this activity.  She notes lower extremity edema after standing for too long, but denies orthopnea or PND.  The edema improves with elevation of her legs.   Ashley Lewis Lewis also reports numbness in her right arm and leg.  This is worse when she lays on her right side.  These symptoms are residual from her stroke that occurred in 2011.  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  Lately she notes fatigue when she helps her daughter get dressed.  She sometimes has to stop to cath her breath.  Ashley Lewis Lewis saw her PCP, Ashley Lewis Lewis, and reported these symptoms.  She was started on atorvastatin and  referred to cardiology for evaluation.   Past Medical History  Diagnosis Date  . Psoriasis   . Stroke [facility]) 2011    left thalamic infarct  . Depression     sees Ashley Lewis Lewis  . Anemia     iron defi  . Eczema   . Hyperlipidemia   . Hypertension   . GERD (gastroesophageal reflux disease)     Past Surgical History  Procedure Laterality Date  . Cervical cerclage      x 2, in 1995, 1997  . Cholecystectomy N/A 02/03/2015    Procedure: LAPAROSCOPIC CHOLECYSTECTOMY;  Surgeon: Ashley Lewis Skinner, MD;  Location: [place];  Service: General;  Laterality: N/A;     Current Outpatient Prescriptions  Medication Sig Dispense Refill  . aspirin EC 81 MG tablet Take 81 mg by mouth daily.    Marland Kitchen atorvastatin (LIPITOR) 20 MG tablet Take 1 tablet by mouth daily. Take 1 tab by mouth daily    . gabapentin (NEURONTIN) 300 MG capsule Take 600 mg by mouth 3 (three) times daily.    Marland Kitchen losartan-hydrochlorothiazide (HYZAAR) 50-12.5 MG tablet Take 1 tablet by mouth daily.  1  . omeprazole (PRILOSEC) 20 MG capsule Take 20 mg by mouth daily.    Marland Kitchen ibuprofen (ADVIL,MOTRIN) 800 MG tablet Take 1 tablet (800 mg total) by mouth every 8 (eight) hours. 21 tablet 0   No current  facility-administered medications for this visit.    Allergies:   Lisinopril    Social History:  The patient  reports that she quit smoking about 5 years ago. She started smoking about 6 years ago. She does not have any smokeless tobacco history on file. She reports that she uses illicit drugs (Marijuana). She reports that she does not drink alcohol.   Family History:  The patient's family history includes Cancer (age of onset: 48) in her mother; Cancer (age of onset: 76) in her father; Heart failure in her maternal grandfather.    ROS:  Please see the history of present illness.   Otherwise, review of systems are positive for none.   All other systems are reviewed and negative.    PHYSICAL EXAM: VS:  BP 116/82 mmHg  Pulse 84  Ht 5\' 3"   (1.6 m)  Wt 79.493 kg (175 lb 4 oz)  BMI 31.05 kg/m2 , BMI Body mass index is 31.05 kg/(m^2). GENERAL:  Well appearing HEENT:  Pupils equal round and reactive, fundi not visualized, oral mucosa unremarkable NECK:  No jugular venous distention, waveform within normal limits, carotid upstroke brisk and symmetric, no bruits, no thyromegaly LYMPHATICS:  No cervical adenopathy LUNGS:  Clear to auscultation bilaterally HEST: Very tender over the right precordium HEART:  RRR.  PMI not displaced or sustained,S1 and S2 within normal limits, no S3, no S4, no clicks, no rubs, no murmurs ABD:  Flat, positive bowel sounds normal in frequency in pitch, no bruits, no rebound, no guarding, no midline pulsatile mass, no hepatomegaly, no splenomegaly EXT:  2 plus pulses throughout, no edema, no cyanosis no clubbing SKIN:  No rashes no nodules NEURO:  Cranial nerves II through XII grossly intact, motor grossly intact throughout PSYCH:  Cognitively intact, oriented to person place and time  EKG:  EKG is ordered today. The ekg ordered today demonstrates sinus rhythm rate 85 bpm.  RBBB.  Recent Labs: 02/05/2015: ALT 80*; BUN 6; Creatinine, Ser 0.86; Hemoglobin 11.3*; Platelets 195; Potassium 3.6; Sodium 140    Lipid Panel    Component Value Date/Time   CHOL 222* 08/12/2012 1210   TRIG 98 08/12/2012 1210   HDL 52 08/12/2012 1210   CHOLHDL 4.3 08/12/2012 1210   VLDL 20 08/12/2012 1210   LDLCALC 150* 08/12/2012 1210      Wt Readings from Last 3 Encounters:  05/26/15 79.493 kg (175 lb 4 oz)  02/03/15 85.27 kg (187 lb 15.8 oz)  06/28/13 75.297 kg (166 lb)      ASSESSMENT AND PLAN:  # Musculoskeletal chest pain:  [name] chest pain is not consistent with ischemia.  She is very tender to palpitation and it is the same pain she has been feeling at home.  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  I suspect that she has  musculoskeletal pain related to this.  She does not have exertional symptoms.  We will treat her with Ibuprofen 800 mg tid x 1 week.  If she continues to have chest pain after that, we will obtain a treadmill stress test.  # Hypertension: Blood pressure well-controlled.  Continue HCTZ-losartan.   # Hyperlipidemia:  Ms. Lesh recently started on atorvastatin with her PCP.  No changes at this time.  # Prior stroke: Continue aspirin and atorvastatin.   Current medicines are reviewed at length with the patient today.  The patient does not have concerns regarding medicines.  The following  changes have been made:  Start ibuprofen 800 mg tid x 1 week  Labs/ tests ordered today include:   Orders Placed This Encounter  Procedures  . EKG 12-Lead     Disposition:   FU with Melvenia Favela C. Oval Linsey, MD, [facility] in 1 year   This note was written with the assistance of speech recognition software.  Please excuse any transcriptional errors.  Signed, Chauna Osoria C. Oval Linsey, MD, [facility]  05/27/2015 3:25 AM    [facility]

## 2015-05-27 ENCOUNTER — Encounter: Payer: Self-pay | Admitting: Cardiovascular Disease

## 2015-05-29 DIAGNOSIS — Z1231 Encounter for screening mammogram for malignant neoplasm of breast: Secondary | ICD-10-CM | POA: Diagnosis not present

## 2015-05-29 DIAGNOSIS — Z803 Family history of malignant neoplasm of breast: Secondary | ICD-10-CM | POA: Diagnosis not present

## 2015-05-31 ENCOUNTER — Encounter: Payer: Self-pay | Admitting: Cardiovascular Disease

## 2015-08-31 DIAGNOSIS — I10 Essential (primary) hypertension: Secondary | ICD-10-CM | POA: Diagnosis not present

## 2015-08-31 DIAGNOSIS — M25511 Pain in right shoulder: Secondary | ICD-10-CM | POA: Diagnosis not present

## 2015-08-31 DIAGNOSIS — E785 Hyperlipidemia, unspecified: Secondary | ICD-10-CM | POA: Diagnosis not present

## 2015-08-31 DIAGNOSIS — I6990 Unspecified sequelae of unspecified cerebrovascular disease: Secondary | ICD-10-CM | POA: Diagnosis not present

## 2015-08-31 DIAGNOSIS — K219 Gastro-esophageal reflux disease without esophagitis: Secondary | ICD-10-CM | POA: Diagnosis not present

## 2015-10-26 DIAGNOSIS — M25511 Pain in right shoulder: Secondary | ICD-10-CM | POA: Diagnosis not present

## 2015-10-26 DIAGNOSIS — L409 Psoriasis, unspecified: Secondary | ICD-10-CM | POA: Diagnosis not present

## 2015-10-26 DIAGNOSIS — L400 Psoriasis vulgaris: Secondary | ICD-10-CM | POA: Diagnosis not present

## 2015-10-26 DIAGNOSIS — I10 Essential (primary) hypertension: Secondary | ICD-10-CM | POA: Diagnosis not present

## 2015-10-26 DIAGNOSIS — E785 Hyperlipidemia, unspecified: Secondary | ICD-10-CM | POA: Diagnosis not present

## 2015-10-26 DIAGNOSIS — I6990 Unspecified sequelae of unspecified cerebrovascular disease: Secondary | ICD-10-CM | POA: Diagnosis not present

## 2015-10-26 DIAGNOSIS — K219 Gastro-esophageal reflux disease without esophagitis: Secondary | ICD-10-CM | POA: Diagnosis not present

## 2015-11-09 DIAGNOSIS — L400 Psoriasis vulgaris: Secondary | ICD-10-CM | POA: Diagnosis not present

## 2015-11-09 DIAGNOSIS — E7800 Pure hypercholesterolemia, unspecified: Secondary | ICD-10-CM | POA: Diagnosis not present

## 2015-11-09 DIAGNOSIS — L235 Allergic contact dermatitis due to other chemical products: Secondary | ICD-10-CM | POA: Diagnosis not present

## 2015-11-09 DIAGNOSIS — K0381 Cracked tooth: Secondary | ICD-10-CM | POA: Diagnosis not present

## 2016-01-01 DIAGNOSIS — K219 Gastro-esophageal reflux disease without esophagitis: Secondary | ICD-10-CM | POA: Diagnosis not present

## 2016-01-01 DIAGNOSIS — I10 Essential (primary) hypertension: Secondary | ICD-10-CM | POA: Diagnosis not present

## 2016-01-01 DIAGNOSIS — E785 Hyperlipidemia, unspecified: Secondary | ICD-10-CM | POA: Diagnosis not present

## 2016-01-01 DIAGNOSIS — M5412 Radiculopathy, cervical region: Secondary | ICD-10-CM | POA: Diagnosis not present

## 2016-01-01 DIAGNOSIS — L409 Psoriasis, unspecified: Secondary | ICD-10-CM | POA: Diagnosis not present

## 2016-01-01 DIAGNOSIS — L400 Psoriasis vulgaris: Secondary | ICD-10-CM | POA: Diagnosis not present

## 2016-05-10 DIAGNOSIS — I10 Essential (primary) hypertension: Secondary | ICD-10-CM | POA: Diagnosis not present

## 2016-05-10 DIAGNOSIS — R768 Other specified abnormal immunological findings in serum: Secondary | ICD-10-CM | POA: Diagnosis not present

## 2016-05-10 DIAGNOSIS — Z9049 Acquired absence of other specified parts of digestive tract: Secondary | ICD-10-CM | POA: Diagnosis not present

## 2016-05-10 DIAGNOSIS — R1012 Left upper quadrant pain: Secondary | ICD-10-CM | POA: Diagnosis not present

## 2016-05-10 DIAGNOSIS — M545 Low back pain: Secondary | ICD-10-CM | POA: Diagnosis not present

## 2016-05-10 DIAGNOSIS — I6990 Unspecified sequelae of unspecified cerebrovascular disease: Secondary | ICD-10-CM | POA: Diagnosis not present

## 2016-05-10 DIAGNOSIS — Z113 Encounter for screening for infections with a predominantly sexual mode of transmission: Secondary | ICD-10-CM | POA: Diagnosis not present

## 2016-05-10 DIAGNOSIS — E785 Hyperlipidemia, unspecified: Secondary | ICD-10-CM | POA: Diagnosis not present

## 2016-05-10 DIAGNOSIS — K219 Gastro-esophageal reflux disease without esophagitis: Secondary | ICD-10-CM | POA: Diagnosis not present

## 2016-05-23 DIAGNOSIS — R1030 Lower abdominal pain, unspecified: Secondary | ICD-10-CM | POA: Diagnosis not present

## 2016-06-20 ENCOUNTER — Other Ambulatory Visit: Payer: Self-pay | Admitting: Obstetrics & Gynecology

## 2016-06-20 ENCOUNTER — Other Ambulatory Visit (HOSPITAL_COMMUNITY)
Admission: RE | Admit: 2016-06-20 | Discharge: 2016-06-20 | Disposition: A | Discharge status: Home / Self Care | Payer: Medicare HMO | Source: Ambulatory Visit | Attending: Obstetrics & Gynecology | Admitting: Obstetrics & Gynecology

## 2016-06-20 DIAGNOSIS — Z1151 Encounter for screening for human papillomavirus (HPV): Secondary | ICD-10-CM | POA: Diagnosis not present

## 2016-06-20 DIAGNOSIS — Z8673 Personal history of transient ischemic attack (TIA), and cerebral infarction without residual deficits: Secondary | ICD-10-CM | POA: Diagnosis not present

## 2016-06-20 DIAGNOSIS — Z01419 Encounter for gynecological examination (general) (routine) without abnormal findings: Secondary | ICD-10-CM | POA: Diagnosis not present

## 2016-06-20 DIAGNOSIS — N9089 Other specified noninflammatory disorders of vulva and perineum: Secondary | ICD-10-CM | POA: Diagnosis not present

## 2016-06-20 DIAGNOSIS — N951 Menopausal and female climacteric states: Secondary | ICD-10-CM | POA: Diagnosis not present

## 2016-06-20 DIAGNOSIS — Z01411 Encounter for gynecological examination (general) (routine) with abnormal findings: Secondary | ICD-10-CM | POA: Diagnosis not present

## 2016-06-25 LAB — CYTOLOGY - PAP
Diagnosis: NEGATIVE
HPV (WINDOPATH): NOT DETECTED

## 2016-07-17 DIAGNOSIS — L738 Other specified follicular disorders: Secondary | ICD-10-CM | POA: Diagnosis not present

## 2016-07-17 DIAGNOSIS — L400 Psoriasis vulgaris: Secondary | ICD-10-CM | POA: Diagnosis not present

## 2016-09-03 DIAGNOSIS — K915 Postcholecystectomy syndrome: Secondary | ICD-10-CM | POA: Diagnosis not present

## 2016-09-03 DIAGNOSIS — I10 Essential (primary) hypertension: Secondary | ICD-10-CM | POA: Diagnosis not present

## 2016-09-03 DIAGNOSIS — Z1211 Encounter for screening for malignant neoplasm of colon: Secondary | ICD-10-CM | POA: Diagnosis not present

## 2016-09-03 DIAGNOSIS — E7800 Pure hypercholesterolemia, unspecified: Secondary | ICD-10-CM | POA: Diagnosis not present

## 2016-09-03 DIAGNOSIS — K219 Gastro-esophageal reflux disease without esophagitis: Secondary | ICD-10-CM | POA: Diagnosis not present

## 2016-09-03 DIAGNOSIS — F4323 Adjustment disorder with mixed anxiety and depressed mood: Secondary | ICD-10-CM | POA: Diagnosis not present

## 2016-09-03 DIAGNOSIS — Z0000 Encounter for general adult medical examination without abnormal findings: Secondary | ICD-10-CM | POA: Diagnosis not present

## 2016-09-03 DIAGNOSIS — Z1231 Encounter for screening mammogram for malignant neoplasm of breast: Secondary | ICD-10-CM | POA: Diagnosis not present

## 2016-09-03 DIAGNOSIS — F329 Major depressive disorder, single episode, unspecified: Secondary | ICD-10-CM | POA: Diagnosis not present

## 2016-09-09 ENCOUNTER — Emergency Department (HOSPITAL_COMMUNITY): Payer: Medicare HMO

## 2016-09-09 ENCOUNTER — Emergency Department (HOSPITAL_COMMUNITY)
Admission: EM | Admit: 2016-09-09 | Discharge: 2016-09-09 | Disposition: A | Discharge status: Home / Self Care | Payer: Medicare HMO | Attending: Emergency Medicine | Admitting: Emergency Medicine

## 2016-09-09 ENCOUNTER — Encounter (HOSPITAL_COMMUNITY): Payer: Self-pay | Admitting: Emergency Medicine

## 2016-09-09 DIAGNOSIS — R05 Cough: Secondary | ICD-10-CM | POA: Insufficient documentation

## 2016-09-09 DIAGNOSIS — Z8673 Personal history of transient ischemic attack (TIA), and cerebral infarction without residual deficits: Secondary | ICD-10-CM | POA: Insufficient documentation

## 2016-09-09 DIAGNOSIS — M546 Pain in thoracic spine: Secondary | ICD-10-CM | POA: Diagnosis not present

## 2016-09-09 DIAGNOSIS — I10 Essential (primary) hypertension: Secondary | ICD-10-CM | POA: Diagnosis not present

## 2016-09-09 DIAGNOSIS — R059 Cough, unspecified: Secondary | ICD-10-CM

## 2016-09-09 DIAGNOSIS — Z79899 Other long term (current) drug therapy: Secondary | ICD-10-CM | POA: Diagnosis not present

## 2016-09-09 DIAGNOSIS — Z87891 Personal history of nicotine dependence: Secondary | ICD-10-CM | POA: Insufficient documentation

## 2016-09-09 DIAGNOSIS — Z7982 Long term (current) use of aspirin: Secondary | ICD-10-CM | POA: Insufficient documentation

## 2016-09-09 DIAGNOSIS — R109 Unspecified abdominal pain: Secondary | ICD-10-CM

## 2016-09-09 DIAGNOSIS — K5900 Constipation, unspecified: Secondary | ICD-10-CM | POA: Diagnosis not present

## 2016-09-09 DIAGNOSIS — R1010 Upper abdominal pain, unspecified: Secondary | ICD-10-CM | POA: Diagnosis not present

## 2016-09-09 DIAGNOSIS — R1084 Generalized abdominal pain: Secondary | ICD-10-CM | POA: Diagnosis not present

## 2016-09-09 DIAGNOSIS — R52 Pain, unspecified: Secondary | ICD-10-CM | POA: Diagnosis not present

## 2016-09-09 LAB — URINALYSIS, ROUTINE W REFLEX MICROSCOPIC
BILIRUBIN URINE: NEGATIVE
GLUCOSE, UA: NEGATIVE mg/dL
KETONES UR: NEGATIVE mg/dL
LEUKOCYTES UA: NEGATIVE
Nitrite: NEGATIVE
PH: 5 (ref 5.0–8.0)
Protein, ur: NEGATIVE mg/dL
SPECIFIC GRAVITY, URINE: 1.03 (ref 1.005–1.030)

## 2016-09-09 LAB — CBC
HCT: 39.3 % (ref 36.0–46.0)
Hemoglobin: 12.9 g/dL (ref 12.0–15.0)
MCH: 27.7 pg (ref 26.0–34.0)
MCHC: 32.8 g/dL (ref 30.0–36.0)
MCV: 84.3 fL (ref 78.0–100.0)
PLATELETS: 191 10*3/uL (ref 150–400)
RBC: 4.66 MIL/uL (ref 3.87–5.11)
RDW: 13.2 % (ref 11.5–15.5)
WBC: 8.2 10*3/uL (ref 4.0–10.5)

## 2016-09-09 LAB — COMPREHENSIVE METABOLIC PANEL
ALT: 21 U/L (ref 14–54)
ANION GAP: 10 (ref 5–15)
AST: 23 U/L (ref 15–41)
Albumin: 4.1 g/dL (ref 3.5–5.0)
Alkaline Phosphatase: 49 U/L (ref 38–126)
BUN: 15 mg/dL (ref 6–20)
CHLORIDE: 109 mmol/L (ref 101–111)
CO2: 24 mmol/L (ref 22–32)
Calcium: 9.3 mg/dL (ref 8.9–10.3)
Creatinine, Ser: 0.65 mg/dL (ref 0.44–1.00)
Glucose, Bld: 93 mg/dL (ref 65–99)
POTASSIUM: 3.2 mmol/L — AB (ref 3.5–5.1)
Sodium: 143 mmol/L (ref 135–145)
TOTAL PROTEIN: 8.1 g/dL (ref 6.5–8.1)
Total Bilirubin: 0.5 mg/dL (ref 0.3–1.2)

## 2016-09-09 LAB — LIPASE, BLOOD: LIPASE: 19 U/L (ref 11–51)

## 2016-09-09 MED ORDER — METHOCARBAMOL 500 MG PO TABS: 500.0000 mg | ORAL_TABLET | Freq: Three times a day (TID) | ORAL | 0 refills | Status: DC | PRN

## 2016-09-09 MED ORDER — IBUPROFEN 600 MG PO TABS: 600.0000 mg | ORAL_TABLET | Freq: Three times a day (TID) | ORAL | 0 refills | Status: DC | PRN

## 2016-09-09 MED ORDER — HYDROMORPHONE HCL 1 MG/ML IJ SOLN
1.0000 mg | Freq: Once | INTRAMUSCULAR | Status: AC
  Administered 2016-09-09: 1 mg via INTRAVENOUS
  Filled 2016-09-09: qty 1

## 2016-09-09 MED ORDER — ONDANSETRON HCL 4 MG/2ML IJ SOLN
4.0000 mg | Freq: Four times a day (QID) | INTRAMUSCULAR | Status: DC | PRN
  Administered 2016-09-09: 4 mg via INTRAVENOUS
  Filled 2016-09-09: qty 2

## 2016-09-09 MED ORDER — DIAZEPAM 5 MG PO TABS
5.0000 mg | ORAL_TABLET | Freq: Once | ORAL | Status: AC
  Administered 2016-09-09: 5 mg via ORAL
  Filled 2016-09-09: qty 1

## 2016-09-09 NOTE — ED Triage Notes (Signed)
Brought in by EMS from home with c/o generalized abdominal pain without N/V/D.  Pt reported that she has chronic abdominal pain but today, she stated "it's the worst that's ever been that I'm not able to walk".  Pt was given Fentanyl 100 mcg IV en route.

## 2016-09-09 NOTE — ED Provider Notes (Signed)
[place] DEPT Provider Note   CSN: [id number] Arrival date & time: 09/09/16  5397     History   Chief Complaint Chief Complaint  Patient presents with  . Abdominal Pain    HPI Ashley Lewis is a 51 y.o. female.  HPI Patient is a 51 year old female who complains of severe left flank pain and generalized abdominal pain.  States the left flank pain radiates down her left buttock region into her left posterior thigh.  She reports the discomfort is worse with standing.  She denies nausea vomiting diarrhea.  She reports painful walking today.  She received 100 g of fentanyl and route.  She denies dysuria or urinary frequency.  No fevers or chills.  No chest pain shortness of breath.  Denies vaginal complaints.  Reports no focal abdominal pain.  She reports the majority of her discomfort is in her left flank   Past Medical History:  Diagnosis Date  . Anemia    iron defi  . Depression    sees Dr. Josetta Huddle  . Eczema   . GERD (gastroesophageal reflux disease)   . Hyperlipidemia   . Hypertension   . Psoriasis   . Stroke [facility]) 2011   left thalamic infarct    Patient Active Problem List   Diagnosis Date Noted  . Biliary colic 67/[birth year]1937  . Neck pain on right side 09/02/2012  . Left arm pain 09/02/2012  . Other and unspecified hyperlipidemia 09/02/2012  . 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 Amenorrhea 08/12/2012  . Essential hypertension, benign 08/12/2012  . CVA 07/06/2009  . MOOD SWINGS 05/09/2009  . TOBACCO USER 02/14/2009  . DEPRESSION, MODERATE, RECURRENT 10/05/2008  . ADJ DISORDER WITH MIXED ANXIETY & DEPRESSED MOOD 08/30/2008  . PSORIASIS 07/01/2008  . OVERWEIGHT 02/11/2007  . ANEMIA, IRON DEFICIENCY 02/11/2007    Past Surgical History:  Procedure Laterality Date  . CERVICAL CERCLAGE     x 2, in 1995, 1997  . CHOLECYSTECTOMY N/A 02/03/2015   Procedure: LAPAROSCOPIC CHOLECYSTECTOMY;  Surgeon: Arta Bruce Kinsinger, MD;  Location: [place];  Service: General;   Laterality: N/A;    OB History    No data available       Home Medications    Prior to Admission medications   Medication Sig Start Date End Date Taking? Authorizing Provider  aspirin EC 81 MG tablet Take 81 mg by mouth daily.    [provider]  atorvastatin (LIPITOR) 20 MG tablet Take 1 tablet by mouth daily. Take 1 tab by mouth daily 05/25/15   [provider]  gabapentin (NEURONTIN) 300 MG capsule Take 600 mg by mouth 3 (three) times daily. 03/07/14   [provider]  ibuprofen (ADVIL,MOTRIN) 600 MG tablet Take 1 tablet (600 mg total) by mouth every 8 (eight) hours as needed. 09/09/16   Jola Schmidt, MD  losartan-hydrochlorothiazide (HYZAAR) 50-12.5 MG tablet Take 1 tablet by mouth daily. 12/02/14   [provider]  methocarbamol (ROBAXIN) 500 MG tablet Take 1 tablet (500 mg total) by mouth every 8 (eight) hours as needed for muscle spasms. 09/09/16   Jola Schmidt, MD  omeprazole (PRILOSEC) 20 MG capsule Take 20 mg by mouth daily.    [provider]    Family History Family History  Problem Relation Age of Onset  . Cancer Mother 60       breast cancer x 2  . Cancer Father 13       pancreatic  . Cancer Maternal Grandmother   . Heart failure  Maternal Grandfather     Social History Social History  Substance Use Topics  . Smoking status: Former Smoker    Packs/day: 0.50    Start date: 03/25/2009    Quit date: 04/14/2010  . Smokeless tobacco: Never Used  . Alcohol use No     Allergies   Lisinopril   Review of Systems Review of Systems  All other systems reviewed and are negative.    Physical Exam Updated Vital Signs BP (!) 136/96   Pulse 66   Temp 98.2 F (36.8 C) (Oral)   Resp 18   SpO2 100%   Physical Exam  Constitutional: She is oriented to person, place, and time. She appears well-developed and well-nourished. No distress.  HENT:  Head: Normocephalic and atraumatic.  Eyes: EOM are normal.  Neck: Normal range  of motion.  Cardiovascular: Normal rate, regular rhythm and normal heart sounds.   Pulmonary/Chest: Effort normal and breath sounds normal.  Abdominal: Soft. She exhibits no distension. There is no tenderness.  Musculoskeletal: Normal range of motion.  5 out of 5 strength in bilateral lower extremity major muscle groups.  Full range of motion bilateral hips, knees, ankles.  Tenderness of the left flank region without tenderness of the thoracic or lumbar spine  Neurological: She is alert and oriented to person, place, and time.  Skin: Skin is warm and dry.  Psychiatric: She has a normal mood and affect. Judgment normal.  Nursing note and vitals reviewed.    ED Treatments / Results  Labs (all labs ordered are listed, but only abnormal results are displayed) Labs Reviewed  COMPREHENSIVE METABOLIC PANEL - Abnormal; Notable for the following:       Result Value   Potassium 3.2 (*)    All other components within normal limits  URINALYSIS, ROUTINE W REFLEX MICROSCOPIC - Abnormal; Notable for the following:    APPearance HAZY (*)    Hgb urine dipstick SMALL (*)    Bacteria, UA RARE (*)    Squamous Epithelial / LPF 6-30 (*)    All other components within normal limits  CBC  LIPASE, BLOOD    EKG  EKG Interpretation None       Radiology Dg Abd Acute W/chest  Result Date: 09/09/2016 CLINICAL DATA:  Cough, upper abdominal pain, constipation EXAM: DG ABDOMEN ACUTE W/ 1V CHEST COMPARISON:  None. FINDINGS: There is no evidence of dilated bowel loops or free intraperitoneal air. No radiopaque calculi or other significant radiographic abnormality is seen. Heart size and mediastinal contours are within normal limits. Both lungs are clear. IMPRESSION: Negative abdominal radiographs.  No acute cardiopulmonary disease. Electronically Signed   By: Kathreen Devoid   On: 09/09/2016 09:14    Procedures Procedures (including critical care time)  Medications Ordered in ED Medications  ondansetron  [facility]) injection 4 mg (4 mg Intravenous Given 09/09/16 0832)  HYDROmorphone (DILAUDID) injection 1 mg (1 mg Intravenous Given 09/09/16 0832)  diazepam (VALIUM) tablet 5 mg (5 mg Oral Given 09/09/16 7121)     Initial Impression / Assessment and Plan / ED Course  I have reviewed the triage vital signs and the nursing notes.  Pertinent labs & imaging results that were available during my care of the patient were reviewed by me and considered in my medical decision making (see chart for details).     Patient has normal range of motion of her lateral lower extremity joints.  She has normal strength in her lower extremity muscles.  Her pain seems to be  more left flank with radiation down her left buttock region and sciatica distribution.  I do not think she needs an urgent/emergent MRI.  She does not have signs to suggest cauda equina.  She's had no bowel or bladder incontinence.  She states that earlier when she was getting up to go the restroom she had severe pain metastases caused her to feel weak.  Final Clinical Impressions(s) / ED Diagnoses   Final diagnoses:  Cough  Upper abdominal pain  Constipation  Acute flank pain  Acute abdominal pain    New Prescriptions New Prescriptions   IBUPROFEN (ADVIL,MOTRIN) 600 MG TABLET    Take 1 tablet (600 mg total) by mouth every 8 (eight) hours as needed.   METHOCARBAMOL (ROBAXIN) 500 MG TABLET    Take 1 tablet (500 mg total) by mouth every 8 (eight) hours as needed for muscle spasms.     Jola Schmidt, MD 09/09/16 [phone]

## 2016-09-09 NOTE — ED Notes (Signed)
Patient requesting pain meds prior to lab draw. Patient states that she is very uncomfortable and in a lot of pain. Repositioned patient to try to help with pain.

## 2016-09-09 NOTE — ED Notes (Signed)
Patient states that she cannot walk to give urine sample at this time.

## 2016-09-09 NOTE — ED Notes (Signed)
Patients legs gave out while attempting to walk into restroom. This tech was assisting patient and caught patient and assisted patient back in to bed. Patient stating that legs are weak and she is still in pain.

## 2016-09-17 DIAGNOSIS — L738 Other specified follicular disorders: Secondary | ICD-10-CM | POA: Diagnosis not present

## 2016-09-17 DIAGNOSIS — L400 Psoriasis vulgaris: Secondary | ICD-10-CM | POA: Diagnosis not present

## 2016-10-02 DIAGNOSIS — M5440 Lumbago with sciatica, unspecified side: Secondary | ICD-10-CM | POA: Diagnosis not present

## 2016-10-02 DIAGNOSIS — G8929 Other chronic pain: Secondary | ICD-10-CM | POA: Diagnosis not present

## 2016-10-02 DIAGNOSIS — K5900 Constipation, unspecified: Secondary | ICD-10-CM | POA: Diagnosis not present

## 2016-10-23 DIAGNOSIS — K5730 Diverticulosis of large intestine without perforation or abscess without bleeding: Secondary | ICD-10-CM | POA: Diagnosis not present

## 2016-10-23 DIAGNOSIS — Z1211 Encounter for screening for malignant neoplasm of colon: Secondary | ICD-10-CM | POA: Diagnosis not present

## 2016-10-23 DIAGNOSIS — D126 Benign neoplasm of colon, unspecified: Secondary | ICD-10-CM | POA: Diagnosis not present

## 2016-10-24 DIAGNOSIS — D126 Benign neoplasm of colon, unspecified: Secondary | ICD-10-CM | POA: Diagnosis not present

## 2016-12-24 DIAGNOSIS — R0789 Other chest pain: Secondary | ICD-10-CM | POA: Diagnosis not present

## 2016-12-24 DIAGNOSIS — I10 Essential (primary) hypertension: Secondary | ICD-10-CM | POA: Diagnosis not present

## 2016-12-24 DIAGNOSIS — N644 Mastodynia: Secondary | ICD-10-CM | POA: Diagnosis not present

## 2016-12-24 DIAGNOSIS — M940 Chondrocostal junction syndrome [Tietze]: Secondary | ICD-10-CM | POA: Diagnosis not present

## 2017-02-25 ENCOUNTER — Encounter: Payer: Self-pay | Admitting: Cardiovascular Disease

## 2017-02-25 ENCOUNTER — Ambulatory Visit: Payer: Medicare HMO | Admitting: Cardiovascular Disease

## 2017-02-25 VITALS — Ht 63.0 in | Wt 169.0 lb

## 2017-02-25 DIAGNOSIS — E781 Pure hyperglyceridemia: Secondary | ICD-10-CM

## 2017-02-25 DIAGNOSIS — I951 Orthostatic hypotension: Secondary | ICD-10-CM

## 2017-02-25 DIAGNOSIS — I10 Essential (primary) hypertension: Secondary | ICD-10-CM

## 2017-02-25 DIAGNOSIS — R0789 Other chest pain: Secondary | ICD-10-CM | POA: Diagnosis not present

## 2017-02-25 MED ORDER — HYDROCHLOROTHIAZIDE 12.5 MG PO TABS: 12.5000 mg | ORAL_TABLET | Freq: Every day | ORAL | 1 refills | Status: DC

## 2017-02-25 NOTE — Progress Notes (Addendum)
Cardiology Office Note   Date:  38/04/5051   ID:  Ashley Lewis, DOB [birth year], MRN [id number]  PCP:  [name], MD  Cardiologist:   Skeet Latch, MD   No chief complaint on file.     History of Present Illness: Ashley Lewis is a 51 y.o. female ith hypertension, prior stroke, tobacco abuse who presents for follow-up.  She was initially seen 05/2015 for an evaluation of chest pain.  Ashley Lewis reported R sided chest pain and shoulder pain that had been ongoing for months.  The pain was felt to be atypical no ischemia evaluation was performed at that time.  She is physically active by walking her dog regularly and had no chest pain or shortness of breath with that activity.  Since her last appointment she had one additional episode of chest pain 12/2016.  She described it as sharp, 9 out of 10 chest pain in her left chest that radiated into her left arm.  It was worse with movement of her arm and also with walking.  There is no associated shortness of breath, nausea, or diaphoresis.  She thought it might of been breast pain and went for mammogram.  However, when she told them she was having chest pain they refused to see her for this appointment.  She reported it to her PCP who felt that the symptoms were due to costochondritis.  She was given prednisone with improvement in her symptoms.  Since then she has not had any recurrent chest pain.  She does not get much formal exercise but continues to care for her adult daughter who needs assistance with all her ADLs.  She has not noted any lower extremity edema, orthopnea, or PND.  She does report some lightheadedness that is worse upon standing.  She sometimes has to go lay down because she feels tired and dizzy.  Past Medical History:  Diagnosis Date  . Anemia    iron defi  . Depression    sees Dr. Josetta Huddle  . Eczema   . GERD (gastroesophageal reflux disease)   . Hyperlipidemia   . Hypertension   . Psoriasis   . Stroke  [facility]) 2011   left thalamic infarct    Past Surgical History:  Procedure Laterality Date  . CERVICAL CERCLAGE     x 2, in 1995, 1997  . CHOLECYSTECTOMY N/A 02/03/2015   Procedure: LAPAROSCOPIC CHOLECYSTECTOMY;  Surgeon: Arta Bruce Kinsinger, MD;  Location: [place];  Service: General;  Laterality: N/A;     Current Outpatient Medications  Medication Sig Dispense Refill  . aspirin EC 81 MG tablet Take 81 mg by mouth daily.    Marland Kitchen atorvastatin (LIPITOR) 20 MG tablet Take 1 tablet by mouth daily. Take 1 tab by mouth daily    . omeprazole (PRILOSEC) 20 MG capsule Take 20 mg by mouth daily.    . hydrochlorothiazide (HYDRODIURIL) 12.5 MG tablet Take 1 tablet (12.5 mg total) by mouth daily. 90 tablet 1   No current facility-administered medications for this visit.     Allergies:   Lisinopril    Social History:  The patient  reports that she quit smoking about 6 years ago. She started smoking about 7 years ago. She smoked 0.50 packs per day. she has never used smokeless tobacco. She reports that she uses drugs. Drug: Marijuana. She reports that she does not drink alcohol.   Family History:  The patient's family history includes Cancer in her maternal grandmother; Cancer (age  of onset: 21) in her mother; Cancer (age of onset: 15) in her father; Heart failure in her maternal grandfather.    ROS:  Please see the history of present illness.   Otherwise, review of systems are positive for none.   All other systems are reviewed and negative.    PHYSICAL EXAM: VS:  Ht 5\' 3"  (1.6 m)   Wt 169 lb (76.7 kg)   BMI 29.94 kg/m  , BMI Body mass index is 29.94 kg/m. Lying 98/64, heart rate 60 Sitting 109/71, heart rate 68  Standing 94/67 heart rate 73 Standing after 3 minutes 108/71, heart rate 73  GENERAL:  Well appearing.  No acute distress.  HEENT: Pupils equal round and reactive, fundi not visualized, oral mucosa unremarkable NECK:  No jugular venous distention, waveform within normal limits,  carotid upstroke brisk and symmetric, no bruits, no thyromegaly LUNGS:  Clear to auscultation bilaterally.   HEART:  RRR.  PMI not displaced or sustained,S1 and S2 within normal limits, no S3, no S4, no clicks, no rubs, no murmurs ABD:  Flat, positive bowel sounds normal in frequency in pitch, no bruits, no rebound, no guarding, no midline pulsatile mass, no hepatomegaly, no splenomegaly EXT:  2 plus pulses throughout, no edema, no cyanosis no clubbing SKIN:  No rashes no nodules NEURO:  Cranial nerves II through XII grossly intact, motor grossly intact throughout PSYCH:  Cognitively intact, oriented to person place and time   EKG:  EKG is ordered today. The ekg ordered today demonstrates sinus rhythm rate 85 bpm.  RBBB. 02/25/17: Sinus rhythm.  Rate 76 bpm.  RBBB.    Recent Labs: 09/09/2016: ALT 21; BUN 15; Creatinine, Ser 0.65; Hemoglobin 12.9; Platelets 191; Potassium 3.2; Sodium 143    Lipid Panel    Component Value Date/Time   CHOL 222 (H) 08/12/2012 1210   TRIG 98 08/12/2012 1210   HDL 52 08/12/2012 1210   CHOLHDL 4.3 08/12/2012 1210   VLDL 20 08/12/2012 1210   LDLCALC 150 (H) 08/12/2012 1210   09/03/16: Total cholesterol 173, triglycerides 106, HDL 41, LDL 111    Wt Readings from Last 3 Encounters:  02/25/17 169 lb (76.7 kg)  05/26/15 175 lb 4 oz (79.5 kg)  02/03/15 187 lb 15.8 oz (85.3 kg)      ASSESSMENT AND PLAN:  # Musculoskeletal chest pain:  Ashley Lewis had recurrent chest pain last month that occurred with movement and improved with prednisone.  She has no exertional symptoms.  No ischemia evaluation at this time.  # Hypertension: # Orthostatic hypotension: Blood pressure is low today.  Her blood pressure dropped from 109/71-90 4/67 upon standing.  We will stop losartan and continue hydrochlorothiazide 12.5 mg daily.  She will get a blood pressure monitor and return in 6 weeks for follow-up.  # Hyperlipidemia: Continue atorvastatin.  She is due for lipids  with her PCP later this month.   # Prior stroke: Continue aspirin and atorvastatin.   Current medicines are reviewed at length with the patient today.  The patient does not have concerns regarding medicines.  The following changes have been made:  Stop losartan  Labs/ tests ordered today include:   No orders of the defined types were placed in this encounter.    Disposition:   FU with Ashley Deemer C. Oval Linsey, MD, [facility] in 6 weeks  This note was written with the assistance of speech recognition software.  Please excuse any transcriptional errors.  Signed, Ashley Mcdermid C. Oval Linsey, MD, [facility]  02/25/2017  12:28 PM    [facility]

## 2017-02-25 NOTE — Patient Instructions (Addendum)
Medication Instructions:  STOP LOSARTAN HCT   START HYDROCHLOROTHIAZIDE 12.5 MG DAILY   Labwork: NONE  Testing/Procedures: NONE  Follow-Up: Your physician recommends that you schedule a follow-up appointment in:  6 WEEKS  Any Other Special Instructions Will Be Listed Below (If Applicable). MONITOR YOU BLOOD PRESSURE AT HOME AND BRING TO YOUR FOLLOW UP APPOINTMENT   If you need a refill on your cardiac medications before your next appointment, please call your pharmacy.

## 2017-02-27 NOTE — Addendum Note (Signed)
Addended by: Zebedee Iba on: 02/27/2017 12:18 PM   Modules accepted: Orders

## 2017-03-07 DIAGNOSIS — E7800 Pure hypercholesterolemia, unspecified: Secondary | ICD-10-CM | POA: Diagnosis not present

## 2017-03-07 DIAGNOSIS — I10 Essential (primary) hypertension: Secondary | ICD-10-CM | POA: Diagnosis not present

## 2017-03-07 DIAGNOSIS — M545 Low back pain: Secondary | ICD-10-CM | POA: Diagnosis not present

## 2017-03-24 DIAGNOSIS — L409 Psoriasis, unspecified: Secondary | ICD-10-CM | POA: Insufficient documentation

## 2017-03-24 DIAGNOSIS — D649 Anemia, unspecified: Secondary | ICD-10-CM | POA: Insufficient documentation

## 2017-03-24 DIAGNOSIS — I10 Essential (primary) hypertension: Secondary | ICD-10-CM | POA: Insufficient documentation

## 2017-03-24 DIAGNOSIS — L309 Dermatitis, unspecified: Secondary | ICD-10-CM | POA: Insufficient documentation

## 2017-03-24 DIAGNOSIS — F329 Major depressive disorder, single episode, unspecified: Secondary | ICD-10-CM | POA: Insufficient documentation

## 2017-03-24 DIAGNOSIS — E785 Hyperlipidemia, unspecified: Secondary | ICD-10-CM | POA: Insufficient documentation

## 2017-03-24 DIAGNOSIS — K219 Gastro-esophageal reflux disease without esophagitis: Secondary | ICD-10-CM | POA: Insufficient documentation

## 2017-03-24 DIAGNOSIS — F32A Depression, unspecified: Secondary | ICD-10-CM | POA: Insufficient documentation

## 2017-04-10 ENCOUNTER — Encounter: Payer: Self-pay | Admitting: Cardiology

## 2017-04-10 ENCOUNTER — Ambulatory Visit: Payer: Medicare HMO | Admitting: Cardiology

## 2017-04-10 DIAGNOSIS — F4323 Adjustment disorder with mixed anxiety and depressed mood: Secondary | ICD-10-CM

## 2017-04-10 DIAGNOSIS — Z8673 Personal history of transient ischemic attack (TIA), and cerebral infarction without residual deficits: Secondary | ICD-10-CM | POA: Diagnosis not present

## 2017-04-10 DIAGNOSIS — I4510 Unspecified right bundle-branch block: Secondary | ICD-10-CM | POA: Diagnosis not present

## 2017-04-10 DIAGNOSIS — I10 Essential (primary) hypertension: Secondary | ICD-10-CM

## 2017-04-10 DIAGNOSIS — E785 Hyperlipidemia, unspecified: Secondary | ICD-10-CM | POA: Diagnosis not present

## 2017-04-10 NOTE — Patient Instructions (Signed)
Medication Instructions:  Your physician recommends that you continue on your current medications as directed. Please refer to the Current Medication list given to you today.  Labwork: None   Testing/Procedures: None   Follow-Up: Your physician recommends that you schedule a follow-up as Needed  Any Other Special Instructions Will Be Listed Below (If Applicable).  If you need a refill on your cardiac medications before your next appointment, please call your pharmacy.

## 2017-04-10 NOTE — Assessment & Plan Note (Signed)
On statin Rx, followed by PCP 

## 2017-04-10 NOTE — Progress Notes (Signed)
[birth year]8250 Ashley Lewis   [birth year]  [id number]  Primary Physician [name], MD Primary Cardiologist: Dr Oval Linsey  HPI:   52 y.o. female with a history of HTN, and prior stroke 2011, who saw Dr Oval Linsey 12/4/128 and was noted to be orthostatic. Her medications were adjusted and she is in the office today for follow up. Her B/P at home is averaging 097-353 systolic. She has not had any orthostatic symptoms.    Current Outpatient Medications  Medication Sig Dispense Refill  . aspirin EC 81 MG tablet Take 81 mg by mouth daily.    Marland Kitchen atorvastatin (LIPITOR) 20 MG tablet Take 1 tablet by mouth daily. Take 1 tab by mouth daily    . hydrochlorothiazide (HYDRODIURIL) 12.5 MG tablet Take 1 tablet (12.5 mg total) by mouth daily. 90 tablet 1  . omeprazole (PRILOSEC) 20 MG capsule Take 20 mg by mouth daily.     No current facility-administered medications for this visit.     Allergies  Allergen Reactions  . Lisinopril Cough    Past Medical History:  Diagnosis Date  . Anemia    iron defi  . Depression    sees Dr. Josetta Huddle  . Eczema   . GERD (gastroesophageal reflux disease)   . Hyperlipidemia   . Hypertension   . Psoriasis   . Stroke [facility]) 2011   left thalamic infarct    Social History   Socioeconomic History  . Marital status: Single    Spouse name: Not on file  . Number of children: Not on file  . Years of education: Not on file  . Highest education level: Not on file  Social Needs  . Financial resource strain: Not on file  . Food insecurity - worry: Not on file  . Food insecurity - inability: Not on file  . Transportation needs - medical: Not on file  . Transportation needs - non-medical: Not on file  Occupational History  . Not on file  Tobacco Use  . Smoking status: Former Smoker    Packs/day: 0.50    Start date: 03/25/2009    Last attempt to quit: 04/14/2010    Years since quitting: 6.9  . Smokeless tobacco: Never Used  Substance and Sexual Activity   . Alcohol use: No  . Drug use: Yes    Types: Marijuana  . Sexual activity: Yes  Other Topics Concern  . Not on file  Social History Narrative   Divorced, mother of 4 children, 3 still live with patient. Has 45 yo daughter with severe MR and special needs.    Does not work outside home. Working to try for disability, has lawyers currently.            Epworth Sleepiness Scale = 6 (as of 05/26/2015)     Family History  Problem Relation Age of Onset  . Cancer Mother 61       breast cancer x 2  . Cancer Father 56       pancreatic  . Cancer Maternal Grandmother   . Heart failure Maternal Grandfather      Review of Systems: General: negative for chills, fever, night sweats or weight changes.  Cardiovascular: negative for chest pain, dyspnea on exertion, edema, orthopnea, palpitations, paroxysmal nocturnal dyspnea or shortness of breath Dermatological: negative for rash Respiratory: negative for cough or wheezing Urologic: negative for hematuria Abdominal: negative for nausea, vomiting, diarrhea, bright red blood per rectum, melena, or hematemesis Neurologic: negative for visual changes, syncope, or  dizziness All other systems reviewed and are otherwise negative except as noted above.    Blood pressure 123/73, pulse 92, height 5\' 3"  (1.6 m), weight 172 lb 3.2 oz (78.1 kg), SpO2 98 %.  General appearance: alert, cooperative and no distress Neck: no carotid bruit and no JVD Lungs: clear to auscultation bilaterally Heart: regular rate and rhythm Extremities: extremities normal, atraumatic, no cyanosis or edema Neurologic: Grossly normal   ASSESSMENT AND PLAN:   History of CVA (cerebrovascular accident) 2011. History of smoking.  Left thalamus infarction.  Hypertension Controlled  Dyslipidemia On statin Rx, followed by PCP  ADJ DISORDER WITH MIXED ANXIETY & DEPRESSED MOOD Pt is on disability, cares for special needs daughter  RBBB .  PLAN  Pt is doing well off  Cozaar, on diuretic. We will be happy to see her again as needed.   Kerin Ransom PA-C 04/10/2017 11:21 AM

## 2017-04-10 NOTE — Assessment & Plan Note (Signed)
Pt is on disability, cares for special needs daughter

## 2017-04-10 NOTE — Assessment & Plan Note (Signed)
2011. History of smoking.  Left thalamus infarction.

## 2017-04-10 NOTE — Assessment & Plan Note (Signed)
Controlled.  

## 2017-07-15 DIAGNOSIS — Z803 Family history of malignant neoplasm of breast: Secondary | ICD-10-CM | POA: Diagnosis not present

## 2017-07-15 DIAGNOSIS — Z1231 Encounter for screening mammogram for malignant neoplasm of breast: Secondary | ICD-10-CM | POA: Diagnosis not present

## 2017-07-23 ENCOUNTER — Ambulatory Visit: Payer: Medicare HMO | Admitting: Psychology

## 2017-07-23 ENCOUNTER — Telehealth: Payer: Self-pay | Admitting: Family Medicine

## 2017-07-23 DIAGNOSIS — F339 Major depressive disorder, recurrent, unspecified: Secondary | ICD-10-CM | POA: Diagnosis not present

## 2017-08-06 ENCOUNTER — Ambulatory Visit: Payer: Medicare HMO | Admitting: Psychology

## 2017-08-14 ENCOUNTER — Ambulatory Visit: Payer: Medicare HMO | Admitting: Psychology

## 2017-08-14 DIAGNOSIS — F339 Major depressive disorder, recurrent, unspecified: Secondary | ICD-10-CM

## 2017-08-20 ENCOUNTER — Other Ambulatory Visit: Payer: Self-pay | Admitting: Cardiovascular Disease

## 2017-08-20 NOTE — Telephone Encounter (Signed)
Rx sent to pharmacy   

## 2017-09-05 DIAGNOSIS — Z0000 Encounter for general adult medical examination without abnormal findings: Secondary | ICD-10-CM | POA: Diagnosis not present

## 2017-09-05 DIAGNOSIS — K915 Postcholecystectomy syndrome: Secondary | ICD-10-CM | POA: Diagnosis not present

## 2017-09-05 DIAGNOSIS — E785 Hyperlipidemia, unspecified: Secondary | ICD-10-CM | POA: Diagnosis not present

## 2017-09-05 DIAGNOSIS — F988 Other specified behavioral and emotional disorders with onset usually occurring in childhood and adolescence: Secondary | ICD-10-CM | POA: Diagnosis not present

## 2017-09-05 DIAGNOSIS — I10 Essential (primary) hypertension: Secondary | ICD-10-CM | POA: Diagnosis not present

## 2017-09-05 DIAGNOSIS — K219 Gastro-esophageal reflux disease without esophagitis: Secondary | ICD-10-CM | POA: Diagnosis not present

## 2017-09-11 ENCOUNTER — Ambulatory Visit: Payer: Medicare HMO | Admitting: Psychology

## 2017-09-11 DIAGNOSIS — F339 Major depressive disorder, recurrent, unspecified: Secondary | ICD-10-CM | POA: Diagnosis not present

## 2017-10-09 ENCOUNTER — Ambulatory Visit: Payer: Medicare HMO | Admitting: Psychology

## 2017-10-09 DIAGNOSIS — F339 Major depressive disorder, recurrent, unspecified: Secondary | ICD-10-CM | POA: Diagnosis not present

## 2017-12-04 ENCOUNTER — Ambulatory Visit: Payer: Medicare HMO | Admitting: Psychology

## 2017-12-04 DIAGNOSIS — F339 Major depressive disorder, recurrent, unspecified: Secondary | ICD-10-CM

## 2018-01-29 ENCOUNTER — Ambulatory Visit: Payer: Medicare HMO | Admitting: Psychology

## 2018-01-29 DIAGNOSIS — F339 Major depressive disorder, recurrent, unspecified: Secondary | ICD-10-CM

## 2018-02-17 ENCOUNTER — Other Ambulatory Visit: Payer: Self-pay | Admitting: Cardiovascular Disease

## 2018-03-10 DIAGNOSIS — R21 Rash and other nonspecific skin eruption: Secondary | ICD-10-CM | POA: Diagnosis not present

## 2018-03-10 DIAGNOSIS — E7800 Pure hypercholesterolemia, unspecified: Secondary | ICD-10-CM | POA: Diagnosis not present

## 2018-03-10 DIAGNOSIS — I10 Essential (primary) hypertension: Secondary | ICD-10-CM | POA: Diagnosis not present

## 2018-03-19 DIAGNOSIS — R21 Rash and other nonspecific skin eruption: Secondary | ICD-10-CM | POA: Diagnosis not present

## 2018-03-19 DIAGNOSIS — E7800 Pure hypercholesterolemia, unspecified: Secondary | ICD-10-CM | POA: Diagnosis not present

## 2018-03-19 DIAGNOSIS — I10 Essential (primary) hypertension: Secondary | ICD-10-CM | POA: Diagnosis not present

## 2018-03-24 DIAGNOSIS — R69 Illness, unspecified: Secondary | ICD-10-CM | POA: Diagnosis not present

## 2018-03-26 ENCOUNTER — Ambulatory Visit: Payer: Medicare HMO | Admitting: Psychology

## 2018-03-26 DIAGNOSIS — F339 Major depressive disorder, recurrent, unspecified: Secondary | ICD-10-CM | POA: Diagnosis not present

## 2018-04-02 ENCOUNTER — Encounter (HOSPITAL_COMMUNITY): Payer: Self-pay

## 2018-04-02 ENCOUNTER — Emergency Department (HOSPITAL_COMMUNITY): Payer: Medicare HMO

## 2018-04-02 ENCOUNTER — Emergency Department (HOSPITAL_COMMUNITY)
Admission: EM | Admit: 2018-04-02 | Discharge: 2018-04-02 | Disposition: A | Discharge status: Home / Self Care | Payer: Medicare HMO | Attending: Emergency Medicine | Admitting: Emergency Medicine

## 2018-04-02 ENCOUNTER — Other Ambulatory Visit: Payer: Self-pay

## 2018-04-02 DIAGNOSIS — Z7982 Long term (current) use of aspirin: Secondary | ICD-10-CM | POA: Diagnosis not present

## 2018-04-02 DIAGNOSIS — R252 Cramp and spasm: Secondary | ICD-10-CM | POA: Diagnosis not present

## 2018-04-02 DIAGNOSIS — R0902 Hypoxemia: Secondary | ICD-10-CM | POA: Diagnosis not present

## 2018-04-02 DIAGNOSIS — E876 Hypokalemia: Secondary | ICD-10-CM | POA: Diagnosis not present

## 2018-04-02 DIAGNOSIS — Z79899 Other long term (current) drug therapy: Secondary | ICD-10-CM | POA: Diagnosis not present

## 2018-04-02 DIAGNOSIS — R200 Anesthesia of skin: Secondary | ICD-10-CM | POA: Diagnosis not present

## 2018-04-02 DIAGNOSIS — R202 Paresthesia of skin: Secondary | ICD-10-CM | POA: Diagnosis not present

## 2018-04-02 DIAGNOSIS — Z87891 Personal history of nicotine dependence: Secondary | ICD-10-CM | POA: Diagnosis not present

## 2018-04-02 DIAGNOSIS — G4489 Other headache syndrome: Secondary | ICD-10-CM | POA: Diagnosis not present

## 2018-04-02 DIAGNOSIS — Z8673 Personal history of transient ischemic attack (TIA), and cerebral infarction without residual deficits: Secondary | ICD-10-CM | POA: Insufficient documentation

## 2018-04-02 DIAGNOSIS — I10 Essential (primary) hypertension: Secondary | ICD-10-CM | POA: Insufficient documentation

## 2018-04-02 DIAGNOSIS — E785 Hyperlipidemia, unspecified: Secondary | ICD-10-CM | POA: Insufficient documentation

## 2018-04-02 DIAGNOSIS — M62838 Other muscle spasm: Secondary | ICD-10-CM | POA: Diagnosis not present

## 2018-04-02 DIAGNOSIS — H5711 Ocular pain, right eye: Secondary | ICD-10-CM | POA: Diagnosis not present

## 2018-04-02 LAB — COMPREHENSIVE METABOLIC PANEL
ALBUMIN: 3.5 g/dL (ref 3.5–5.0)
ALK PHOS: 39 U/L (ref 38–126)
ALT: 21 U/L (ref 0–44)
ANION GAP: 10 (ref 5–15)
AST: 23 U/L (ref 15–41)
BUN: 9 mg/dL (ref 6–20)
CO2: 26 mmol/L (ref 22–32)
Calcium: 8.8 mg/dL — ABNORMAL LOW (ref 8.9–10.3)
Chloride: 102 mmol/L (ref 98–111)
Creatinine, Ser: 0.64 mg/dL (ref 0.44–1.00)
GFR calc Af Amer: >60 mL/min (ref >60)
GFR calc non Af Amer: >60 mL/min (ref >60)
GLUCOSE: 92 mg/dL (ref 70–99)
Potassium: 2.7 mmol/L — CL (ref 3.5–5.1)
Sodium: 138 mmol/L (ref 135–145)
Total Bilirubin: 0.3 mg/dL (ref 0.3–1.2)
Total Protein: 7.3 g/dL (ref 6.5–8.1)

## 2018-04-02 LAB — CBC WITH DIFFERENTIAL/PLATELET
Abs Immature Granulocytes: 0.03 10*3/uL (ref 0.00–0.07)
Basophils Absolute: 0 10*3/uL (ref 0.0–0.1)
Basophils Relative: 0 %
Eosinophils Absolute: 0.1 10*3/uL (ref 0.0–0.5)
Eosinophils Relative: 1 %
HCT: 38.2 % (ref 36.0–46.0)
Hemoglobin: 12.2 g/dL (ref 12.0–15.0)
Immature Granulocytes: 0 %
Lymphocytes Relative: 35 %
Lymphs Abs: 2.9 10*3/uL (ref 0.7–4.0)
MCH: 27.4 pg (ref 26.0–34.0)
MCHC: 31.9 g/dL (ref 30.0–36.0)
MCV: 85.7 fL (ref 80.0–100.0)
Monocytes Absolute: 0.6 10*3/uL (ref 0.1–1.0)
Monocytes Relative: 8 %
NEUTROS PCT: 56 %
Neutro Abs: 4.6 10*3/uL (ref 1.7–7.7)
PLATELETS: 205 10*3/uL (ref 150–400)
RBC: 4.46 MIL/uL (ref 3.87–5.11)
RDW: 12.3 % (ref 11.5–15.5)
WBC: 8.2 10*3/uL (ref 4.0–10.5)
nRBC: 0 % (ref 0.0–0.2)

## 2018-04-02 LAB — RAPID URINE DRUG SCREEN, HOSP PERFORMED
AMPHETAMINES: NOT DETECTED
Barbiturates: NOT DETECTED
Benzodiazepines: NOT DETECTED
Cocaine: NOT DETECTED
Opiates: NOT DETECTED
Tetrahydrocannabinol: POSITIVE — AB

## 2018-04-02 LAB — C-REACTIVE PROTEIN

## 2018-04-02 LAB — SEDIMENTATION RATE: Sed Rate: 15 mm/hr (ref 0–22)

## 2018-04-02 LAB — TSH: TSH: 1.876 u[IU]/mL (ref 0.350–4.500)

## 2018-04-02 MED ORDER — ACETAMINOPHEN 325 MG PO TABS
650.0000 mg | ORAL_TABLET | Freq: Once | ORAL | Status: AC
  Administered 2018-04-02: 650 mg via ORAL
  Filled 2018-04-02: qty 2

## 2018-04-02 MED ORDER — GADOBUTROL 1 MMOL/ML IV SOLN
7.0000 mL | Freq: Once | INTRAVENOUS | Status: AC | PRN
  Administered 2018-04-02: 7 mL via INTRAVENOUS

## 2018-04-02 MED ORDER — POTASSIUM CHLORIDE CRYS ER 20 MEQ PO TBCR: 20.0000 meq | EXTENDED_RELEASE_TABLET | Freq: Every day | ORAL | 0 refills | Status: DC

## 2018-04-02 MED ORDER — POTASSIUM CHLORIDE CRYS ER 20 MEQ PO TBCR
40.0000 meq | EXTENDED_RELEASE_TABLET | Freq: Once | ORAL | Status: AC
  Administered 2018-04-02: 40 meq via ORAL
  Filled 2018-04-02: qty 2

## 2018-04-02 NOTE — ED Notes (Signed)
Patient verbalizes understanding of discharge instructions. Opportunity for questioning and answers were provided. Armband removed by staff, pt discharged from ED. Pt ambulatory to lobby. Prescriptions reviewed.  

## 2018-04-02 NOTE — ED Notes (Signed)
EDP notified of potassium result.

## 2018-04-02 NOTE — Consult Note (Signed)
Neurology Consultation Reason for Consult: Abnormal head movements Referring Physician: Theodis Sato  CC: Abnormal head movements  History is obtained from: Patient  HPI: Ashley Lewis is a 53 y.o. female who presents with abnormal hand cramping type movements and pain.  She states that it affected her right side today, but often affects her left side.  She states that her hand forms a clawlike posture and feels what she describes as "numb" but when I ask she really means pain.  I asked her if the actual sensation if she were to touch it with her other hand is altered during this episode, and she states that she is not certain, but that it hurts an awful lot and she definitely can feel the pain.  This has since resolved, and she is currently back to baseline.  She also reports that she noticed that the tip of her third digit on her right foot has some numbness that she did not notice before.  ROS: A 14 point ROS was performed and is negative except as noted in the HPI.   Past Medical History:  Diagnosis Date  . Anemia    iron defi  . Depression    sees Dr. Josetta Huddle  . Eczema   . GERD (gastroesophageal reflux disease)   . Hyperlipidemia   . Hypertension   . Psoriasis   . Stroke [facility]) 2011   left thalamic infarct     Family History  Problem Relation Age of Onset  . Cancer Mother 43       breast cancer x 2  . Cancer Father 24       pancreatic  . Cancer Maternal Grandmother   . Heart failure Maternal Grandfather      Social History:  reports that she quit smoking about 7 years ago. She started smoking about 9 years ago. She smoked 0.50 packs per day. She has never used smokeless tobacco. She reports current drug use. Drug: Marijuana. She reports that she does not drink alcohol.   Exam: Current vital signs: BP (!) 142/78   Pulse 68   Temp 98 F (36.7 C) (Oral)   Resp 16   Ht 5\' 3"  (1.6 m)   Wt 76.7 kg   SpO2 99%   BMI 29.94 kg/m  Vital signs in last 24  hours: Temp:  [98 F (36.7 C)] 98 F (36.7 C) (01/09 1238) Pulse Rate:  [61-83] 68 (01/09 1930) Resp:  [12-18] 16 (01/09 1930) BP: (120-146)/(70-86) 142/78 (01/09 1930) SpO2:  [98 %-100 %] 99 % (01/09 1930) Weight:  [76.7 kg] 76.7 kg (01/09 1233)   Physical Exam  Constitutional: Appears well-developed and well-nourished.  Psych: Affect appropriate to situation Eyes: No scleral injection HENT: No OP obstrucion Head: Normocephalic.  Cardiovascular: Normal rate and regular rhythm.  Respiratory: Effort normal, non-labored breathing GI: Soft.  No distension. There is no tenderness.  Skin: WDI  Neuro: Mental Status: Patient is awake, alert, oriented to person, place, month, year, and situation. Patient is able to give a clear and coherent history. No signs of aphasia or neglect Cranial Nerves: II: Visual Fields are full. Pupils are equal, round, and reactive to light.   III,IV, VI: EOMI without ptosis or diploplia.  V: Facial sensation is symmetric to temperature VII: Facial movement is symmetric.  VIII: hearing is intact to voice X: Uvula elevates symmetrically XI: Shoulder shrug is symmetric. XII: tongue is midline without atrophy or fasciculations.  Motor: Tone is normal. Bulk is normal. 5/5 strength  was present in all four extremities.  Sensory: Sensation is symmetric to light touch and temperature in the arms and legs. Cerebellar: FNF intact bilaterally  I have reviewed labs in epic and the results pertinent to this consultation are: Potassium 2.7  I have reviewed the images obtained: MRI brain and cervical spine-no etiology of her symptoms  Impression: 53 year old female with previous history of stroke who presents with right hand cramping.  The fact that it often is bilateral, symptoms one, sometimes the other makes me think that a primary neurological cause of the symptoms is relatively unlikely.  I think that rather it is more likely related to hypokalemia.  Her  very isolated numbness in the tip of her toe on the right is of unclear significance, I wonder about local injury due to shoe or other compressive type injury.  With such a focal area, I doubt any type of central cause.  Recommendations: 1) replete potassium per ER physician 2) no further recommendations at this time, please call with any further questions or concerns.   Roland Rack, MD [facility] [phone]  If 7pm- 7am, please page neurology on call as listed in [place].

## 2018-04-02 NOTE — ED Notes (Signed)
ED Provider at bedside. 

## 2018-04-02 NOTE — ED Provider Notes (Signed)
[place] EMERGENCY DEPARTMENT Provider Note   CSN: [id number] Arrival date & time: 04/02/18  1232     History   Chief Complaint Chief Complaint  Patient presents with  . Hand Numbness    HPI Ashley Lewis is a 53 y.o. female.  HPI Patient reports that she has been getting right hand numbness that comes and goes.  It is hard to identify the exact duration of symptoms but it does appear that this is been something coming and going possibly for several months.  She reports normally however it is kind of goes away and then she does not think about it again for a while.  Now she has gotten this happening several times and persisting more.  She reports that this whole "episode".  Started in the middle the night when she noticed that her right third toe was completely numb.  She reports she was going to get in touch with her doctor about that but then today while she was driving her right hand and arm started to feel very stiff and heavy.  She reports that since that onset which was at about 1030 before going to the dentist it has stayed numb and feels stiff.  She reports she is also had a similar kind of feeling coming her left hand at about the same time occasionally.  Patient does not have a headache.  She has not had fevers or chills.  Reports she has developed more more of a papular type of skin rash for which she is getting referred to dermatology.  She also has plaque psoriasis which she is had for a long time.  Patient has distant history of a right-sided stroke.  She reports she never had any residual dysfunction however.  No seizure history.  Patient became tearful and reports that she is worried about having MS. Past Medical History:  Diagnosis Date  . Anemia    iron defi  . Depression    sees Dr. Josetta Huddle  . Eczema   . GERD (gastroesophageal reflux disease)   . Hyperlipidemia   . Hypertension   . Psoriasis   . Stroke [facility]) 2011   left thalamic infarct     Patient Active Problem List   Diagnosis Date Noted  . RBBB 04/10/2017  . Psoriasis   . Hypertension   . Hyperlipidemia   . GERD (gastroesophageal reflux disease)   . Eczema   . Depression   . Anemia   . Biliary colic 40/34/7425  . Neck pain on right side 09/02/2012  . Left arm pain 09/02/2012  . Dyslipidemia 09/02/2012  . 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 Amenorrhea 08/12/2012  . Essential hypertension, benign 08/12/2012  . History of CVA (cerebrovascular accident) 07/06/2009  . MOOD SWINGS 05/09/2009  . TOBACCO USER 02/14/2009  . DEPRESSION, MODERATE, RECURRENT 10/05/2008  . ADJ DISORDER WITH MIXED ANXIETY & DEPRESSED MOOD 08/30/2008  . OVERWEIGHT 02/11/2007  . ANEMIA, IRON DEFICIENCY 02/11/2007    Past Surgical History:  Procedure Laterality Date  . CERVICAL CERCLAGE     x 2, in 1995, 1997  . CHOLECYSTECTOMY N/A 02/03/2015   Procedure: LAPAROSCOPIC CHOLECYSTECTOMY;  Surgeon: Mickeal Skinner, MD;  Location: [facility];  Service: General;  Laterality: N/A;     OB History   No obstetric history on file.      Home Medications    Prior to Admission medications   Medication Sig Start Date End Date Taking? Authorizing Provider  aspirin EC 81 MG tablet  Take 81 mg by mouth daily.    [provider]  atorvastatin (LIPITOR) 20 MG tablet Take 1 tablet by mouth daily. Take 1 tab by mouth daily 05/25/15   [provider]  hydrochlorothiazide (HYDRODIURIL) 12.5 MG tablet TAKE 1 TABLET BY MOUTH EVERY DAY 08/20/17   Skeet Latch, MD  omeprazole (PRILOSEC) 20 MG capsule Take 20 mg by mouth daily.    [provider]    Family History Family History  Problem Relation Age of Onset  . Cancer Mother 47       breast cancer x 2  . Cancer Father 98       pancreatic  . Cancer Maternal Grandmother   . Heart failure Maternal Grandfather     Social History Social History   Tobacco Use  . Smoking status: Former Smoker    Packs/day: 0.50     Start date: 03/25/2009    Last attempt to quit: 04/14/2010    Years since quitting: 7.9  . Smokeless tobacco: Never Used  Substance Use Topics  . Alcohol use: No  . Drug use: Yes    Types: Marijuana     Allergies   Lisinopril   Review of Systems Review of Systems 10 Systems reviewed and are negative for acute change except as noted in the HPI.   Physical Exam Updated Vital Signs BP 137/74   Pulse 61   Temp 98 F (36.7 C) (Oral)   Resp 12   Ht 5\' 3"  (1.6 m)   Wt 76.7 kg   SpO2 100%   BMI 29.94 kg/m   Physical Exam Constitutional:      Appearance: She is well-developed.     Comments: Patient is alert in no distress.  Her mental status is clear.  No respiratory distress.  HENT:     Head: Normocephalic and atraumatic.     Nose: Nose normal.  Eyes:     Extraocular Movements: Extraocular movements intact.     Pupils: Pupils are equal, round, and reactive to light.  Neck:     Musculoskeletal: Neck supple.  Cardiovascular:     Rate and Rhythm: Normal rate and regular rhythm.     Heart sounds: Normal heart sounds.  Pulmonary:     Effort: Pulmonary effort is normal.     Breath sounds: Normal breath sounds.  Abdominal:     General: Bowel sounds are normal. There is no distension.     Palpations: Abdomen is soft.     Tenderness: There is no abdominal tenderness.  Musculoskeletal: Normal range of motion.  Skin:    General: Skin is warm and dry.     Findings: Rash present.     Comments: Patient has plaque rash on her extensor surfaces consistent with psoriasis.  She also has a papular rash over a number of skin surfaces.  It appears chronic.  There is no erythema pustules or vesicles.  Neurological:     Mental Status: She is alert and oriented to person, place, and time.     GCS: GCS eye subscore is 4. GCS verbal subscore is 5. GCS motor subscore is 6.     Coordination: Coordination normal.     Comments: Patient has good motor strength for grip bilateral upper  extremities and push pull.  She does have a type of a fine what looks to be intrinsic tremor to the hands.  She has intact muscular strength for flexion extension of the lower extremities.  She can elevate and resist downward  pressure.  At this time, she is denying sensory differential to light touch left to right.  Psychiatric:        Mood and Affect: Mood normal.      ED Treatments / Results  Labs (all labs ordered are listed, but only abnormal results are displayed) Labs Reviewed - No data to display  EKG EKG Interpretation  Date/Time:  Thursday April 02 2018 12:35:44 EST Ventricular Rate:  64 PR Interval:    QRS Duration: 150 QT Interval:  440 QTC Calculation: 409 R Axis:   42 Text Interpretation:  Sinus rhythm Right bundle branch block old RBBB no change from previous Confirmed by Charlesetta Shanks [phone]) on 04/02/2018 12:52:47 PM   Radiology No results found.  Procedures Procedures (including critical care time)  Medications Ordered in ED Medications - No data to display   Initial Impression / Assessment and Plan / ED Course  I have reviewed the triage vital signs and the nursing notes.  Pertinent labs & imaging results that were available during my care of the patient were reviewed by me and considered in my medical decision making (see chart for details).  Clinical Course as of Apr 11 2008  Thu Apr 02, 2018  1830 Patient's potassium noted to be low at 2.7.  Have ordered her some oral potassium.  Possibly because of her hand spasms.   [MB]  1937 Patient's neuro imaging was unremarkable.  She was seen by neurologic consultant Dr. Saralyn Pilar who feels this is likely related to her hypokalemia and some muscle spasm.  We have given her some potassium as repletion and we will send her home with a prescription to continue this.  She will need to follow-up with her primary care doctor for reevaluation of this.   [MB]    Clinical Course User Index [MB] Hayden Rasmussen, MD    Consult: Neurology has been consulted will see patient in the emergency department.  Patient symptoms are concerning for possible MS/stroke/other possible peripheral etiology.  MRI ordered and neurology consulted.  Final Clinical Impressions(s) / ED Diagnoses   Final diagnoses:  Muscle cramps  Hypokalemia    ED Discharge Orders    None       Charlesetta Shanks, MD 04/11/18 2012

## 2018-04-02 NOTE — ED Triage Notes (Addendum)
Per [facility], pt coming from home reports of right third toe numbness starting at 0400. Pt endorses having bilateral hand numbness, stiffness and pain starting at 1145. Pt reports numbness/stiffness is coming and going and right hand feels a little better. Pt endorses sharp right eye pain 10/10 starting around 1130. Pt reports hx of same pain, states it comes and goes. Pt denies vision or speech changes. Face is symmetrical. No drifts noticed. Pt reports this has happened before, but years ago. Pt took amlodipine this morning for first time. Hx of stroke with no deficits except some memory loss.

## 2018-04-02 NOTE — Discharge Instructions (Addendum)
You were seen in the emergency department for right and left hand cramping and pain.  You had blood work that showed your potassium was low.  Your MRI of your cervical spine and brain did not show any obvious explanation for your symptoms.  Neurology felt it was most likely later to the low potassium and they recommend that you follow-up with your primary care doctor for continued management of this.  We are prescribing you some potassium supplements and please return to the emergency department if any worsening symptoms.

## 2018-04-02 NOTE — ED Provider Notes (Signed)
Signout from Dr. Vallery Ridge.  53 year old female with numbness and stiffness in hands right greater than left.  She is pending a MRI of her head and C-spine.  Neuro consult is also pending.  Plan is to follow-up on these results and disposition per neuro recommendations.  Clinical Course as of Apr 02 2225  Thu Apr 02, 2018  1830 Patient's potassium noted to be low at 2.7.  Have ordered her some oral potassium.  Possibly because of her hand spasms.   [MB]  1937 Patient's neuro imaging was unremarkable.  She was seen by neurologic consultant Dr. Saralyn Pilar who feels this is likely related to her hypokalemia and some muscle spasm.  We have given her some potassium as repletion and we will send her home with a prescription to continue this.  She will need to follow-up with her primary care doctor for reevaluation of this.   [MB]    Clinical Course User Index [MB] Hayden Rasmussen, MD      Hayden Rasmussen, MD 04/02/18 2227

## 2018-04-02 NOTE — ED Notes (Signed)
Patient transported to MRI 

## 2018-04-02 NOTE — ED Notes (Signed)
Pt still in MRI, RN to give potassium upon return to room.

## 2018-04-03 DIAGNOSIS — R21 Rash and other nonspecific skin eruption: Secondary | ICD-10-CM | POA: Diagnosis not present

## 2018-04-03 DIAGNOSIS — L400 Psoriasis vulgaris: Secondary | ICD-10-CM | POA: Diagnosis not present

## 2018-04-03 LAB — T3, FREE: T3, Free: 3.4 pg/mL (ref 2.0–4.4)

## 2018-04-14 DIAGNOSIS — E7800 Pure hypercholesterolemia, unspecified: Secondary | ICD-10-CM | POA: Diagnosis not present

## 2018-04-14 DIAGNOSIS — E876 Hypokalemia: Secondary | ICD-10-CM | POA: Diagnosis not present

## 2018-04-14 DIAGNOSIS — I10 Essential (primary) hypertension: Secondary | ICD-10-CM | POA: Diagnosis not present

## 2018-04-14 DIAGNOSIS — R21 Rash and other nonspecific skin eruption: Secondary | ICD-10-CM | POA: Diagnosis not present

## 2018-04-16 DIAGNOSIS — L439 Lichen planus, unspecified: Secondary | ICD-10-CM | POA: Diagnosis not present

## 2018-04-16 DIAGNOSIS — L400 Psoriasis vulgaris: Secondary | ICD-10-CM | POA: Diagnosis not present

## 2018-05-20 DIAGNOSIS — R21 Rash and other nonspecific skin eruption: Secondary | ICD-10-CM | POA: Diagnosis not present

## 2018-05-20 DIAGNOSIS — L700 Acne vulgaris: Secondary | ICD-10-CM | POA: Diagnosis not present

## 2018-05-20 DIAGNOSIS — L400 Psoriasis vulgaris: Secondary | ICD-10-CM | POA: Diagnosis not present

## 2018-05-20 DIAGNOSIS — L308 Other specified dermatitis: Secondary | ICD-10-CM | POA: Diagnosis not present

## 2018-05-20 DIAGNOSIS — Z79899 Other long term (current) drug therapy: Secondary | ICD-10-CM | POA: Diagnosis not present

## 2018-05-28 DIAGNOSIS — Z0100 Encounter for examination of eyes and vision without abnormal findings: Secondary | ICD-10-CM | POA: Diagnosis not present

## 2018-06-25 ENCOUNTER — Ambulatory Visit (INDEPENDENT_AMBULATORY_CARE_PROVIDER_SITE_OTHER): Payer: Medicare HMO | Admitting: Psychology

## 2018-06-25 ENCOUNTER — Ambulatory Visit: Payer: Medicare HMO | Admitting: Psychology

## 2018-06-25 DIAGNOSIS — F339 Major depressive disorder, recurrent, unspecified: Secondary | ICD-10-CM

## 2018-07-14 DIAGNOSIS — L409 Psoriasis, unspecified: Secondary | ICD-10-CM | POA: Diagnosis not present

## 2018-07-14 DIAGNOSIS — R21 Rash and other nonspecific skin eruption: Secondary | ICD-10-CM | POA: Diagnosis not present

## 2018-07-14 DIAGNOSIS — I10 Essential (primary) hypertension: Secondary | ICD-10-CM | POA: Diagnosis not present

## 2018-07-14 DIAGNOSIS — E7800 Pure hypercholesterolemia, unspecified: Secondary | ICD-10-CM | POA: Diagnosis not present

## 2018-07-14 DIAGNOSIS — R635 Abnormal weight gain: Secondary | ICD-10-CM | POA: Diagnosis not present

## 2018-07-22 DIAGNOSIS — M2550 Pain in unspecified joint: Secondary | ICD-10-CM | POA: Diagnosis not present

## 2018-07-24 ENCOUNTER — Emergency Department (HOSPITAL_COMMUNITY): Payer: Medicare HMO

## 2018-07-24 ENCOUNTER — Emergency Department (HOSPITAL_COMMUNITY)
Admission: EM | Admit: 2018-07-24 | Discharge: 2018-07-24 | Disposition: A | Discharge status: Home / Self Care | Payer: Medicare HMO | Attending: Emergency Medicine | Admitting: Emergency Medicine

## 2018-07-24 ENCOUNTER — Other Ambulatory Visit: Payer: Self-pay

## 2018-07-24 ENCOUNTER — Encounter (HOSPITAL_COMMUNITY): Payer: Self-pay

## 2018-07-24 DIAGNOSIS — R079 Chest pain, unspecified: Secondary | ICD-10-CM

## 2018-07-24 DIAGNOSIS — Z8673 Personal history of transient ischemic attack (TIA), and cerebral infarction without residual deficits: Secondary | ICD-10-CM | POA: Insufficient documentation

## 2018-07-24 DIAGNOSIS — R0789 Other chest pain: Secondary | ICD-10-CM | POA: Insufficient documentation

## 2018-07-24 DIAGNOSIS — R1011 Right upper quadrant pain: Secondary | ICD-10-CM | POA: Diagnosis not present

## 2018-07-24 DIAGNOSIS — Z87891 Personal history of nicotine dependence: Secondary | ICD-10-CM | POA: Insufficient documentation

## 2018-07-24 DIAGNOSIS — I10 Essential (primary) hypertension: Secondary | ICD-10-CM | POA: Insufficient documentation

## 2018-07-24 DIAGNOSIS — I4510 Unspecified right bundle-branch block: Secondary | ICD-10-CM | POA: Diagnosis not present

## 2018-07-24 DIAGNOSIS — F1290 Cannabis use, unspecified, uncomplicated: Secondary | ICD-10-CM | POA: Insufficient documentation

## 2018-07-24 DIAGNOSIS — R197 Diarrhea, unspecified: Secondary | ICD-10-CM | POA: Diagnosis not present

## 2018-07-24 LAB — COMPREHENSIVE METABOLIC PANEL
ALT: 19 U/L (ref 0–44)
AST: 17 U/L (ref 15–41)
Albumin: 4.2 g/dL (ref 3.5–5.0)
Alkaline Phosphatase: 56 U/L (ref 38–126)
Anion gap: 14 (ref 5–15)
BUN: 8 mg/dL (ref 6–20)
CO2: 21 mmol/L — ABNORMAL LOW (ref 22–32)
Calcium: 9.4 mg/dL (ref 8.9–10.3)
Chloride: 104 mmol/L (ref 98–111)
Creatinine, Ser: 0.68 mg/dL (ref 0.44–1.00)
GFR calc Af Amer: >60 mL/min (ref >60)
GFR calc non Af Amer: >60 mL/min (ref >60)
Glucose, Bld: 94 mg/dL (ref 70–99)
Potassium: 3.3 mmol/L — ABNORMAL LOW (ref 3.5–5.1)
Sodium: 139 mmol/L (ref 135–145)
Total Bilirubin: 0.3 mg/dL (ref 0.3–1.2)
Total Protein: 8.4 g/dL — ABNORMAL HIGH (ref 6.5–8.1)

## 2018-07-24 LAB — TROPONIN I: Troponin I: <0.03 ng/mL (ref <0.03)

## 2018-07-24 LAB — URINALYSIS, ROUTINE W REFLEX MICROSCOPIC
Bilirubin Urine: NEGATIVE
Glucose, UA: NEGATIVE mg/dL
Ketones, ur: NEGATIVE mg/dL
Leukocytes,Ua: NEGATIVE
Nitrite: NEGATIVE
Protein, ur: NEGATIVE mg/dL
Specific Gravity, Urine: >1.046 — ABNORMAL HIGH (ref 1.005–1.030)
pH: 5 (ref 5.0–8.0)

## 2018-07-24 LAB — CBC
HCT: 42.1 % (ref 36.0–46.0)
Hemoglobin: 13.4 g/dL (ref 12.0–15.0)
MCH: 27.6 pg (ref 26.0–34.0)
MCHC: 31.8 g/dL (ref 30.0–36.0)
MCV: 86.8 fL (ref 80.0–100.0)
Platelets: 235 10*3/uL (ref 150–400)
RBC: 4.85 MIL/uL (ref 3.87–5.11)
RDW: 12.6 % (ref 11.5–15.5)
WBC: 9.9 10*3/uL (ref 4.0–10.5)
nRBC: 0 % (ref 0.0–0.2)

## 2018-07-24 LAB — I-STAT BETA HCG BLOOD, ED (MC, WL, AP ONLY): I-stat hCG, quantitative: <5 m[IU]/mL (ref <5)

## 2018-07-24 LAB — LIPASE, BLOOD: Lipase: 21 U/L (ref 11–51)

## 2018-07-24 MED ORDER — SODIUM CHLORIDE 0.9% FLUSH
3.0000 mL | Freq: Once | INTRAVENOUS | Status: AC
  Administered 2018-07-24: 14:00:00 3 mL via INTRAVENOUS

## 2018-07-24 MED ORDER — IOHEXOL 300 MG/ML  SOLN
100.0000 mL | Freq: Once | INTRAMUSCULAR | Status: AC | PRN
  Administered 2018-07-24: 100 mL via INTRAVENOUS

## 2018-07-24 MED ORDER — DICLOFENAC SODIUM 1 % TD GEL: 2.0000 g | Freq: Four times a day (QID) | TRANSDERMAL | 0 refills | Status: DC | PRN

## 2018-07-24 NOTE — ED Provider Notes (Signed)
Emergency Department Provider Note   I have reviewed the triage vital signs and the nursing notes.   HISTORY  Chief Complaint Abdominal Pain   HPI Ashley Lewis is a 53 y.o. female with PMH of GERD, HTN, HLD, and prior CVA presents to the emergency department for evaluation of right upper quadrant abdominal/right lower chest discomfort.  Patient's symptoms have been ongoing for the past several days.  She describes the pain as constant and persistent.  She spoke to her PCP who recommended that she present to the emergency department but states she was initially concerned about doing so.  When symptoms continued today she decided to present.  She denies any fevers, cough, or shortness of breath.  The pain is worse with touching the area.  No change with eating.  No nausea or vomiting.  She has had some loose stool without blood. No lower abd pain or UTI symptoms. No known COVID 19 contacts. She notes a remote history of PNA in 1995 that feels somewhat similar to this.    Past Medical History:  Diagnosis Date   Anemia    iron defi   Depression    sees Dr. 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   Amenorrhea 08/12/2012   Essential hypertension, benign 08/12/2012   History of CVA (cerebrovascular accident) 07/06/2009   MOOD SWINGS 05/09/2009   TOBACCO USER 02/14/2009   DEPRESSION, MODERATE, RECURRENT 10/05/2008   ADJ DISORDER WITH MIXED ANXIETY & DEPRESSED MOOD 08/30/2008   OVERWEIGHT  02/11/2007   ANEMIA, IRON DEFICIENCY 02/11/2007    Past Surgical History:  Procedure Laterality Date   CERVICAL CERCLAGE     x 2, in 1995, [place] N/A 02/03/2015   Procedure: LAPAROSCOPIC CHOLECYSTECTOMY;  Surgeon: Arta Bruce Kinsinger, MD;  Location: [facility] OR;  Service: General;  Laterality: N/A;    Allergies Lisinopril  Family History  Problem Relation Age of Onset   Cancer Mother 20       breast cancer x 2   Cancer Father 5       pancreatic   Cancer Maternal Grandmother    Heart failure Maternal Grandfather     Social History Social History   Tobacco Use   Smoking status: Former Smoker    Packs/day: 0.50    Start date: 03/25/2009    Last attempt to quit: 04/14/2010    Years since quitting: 8.2   Smokeless tobacco: Never Used  Substance Use Topics   Alcohol use: No   Drug use: Yes    Types: Marijuana    Review of Systems  Constitutional: No fever/chills Eyes: No visual changes. ENT: No sore throat. Cardiovascular: Positive chest pain. Respiratory: Denies shortness of breath. Gastrointestinal: Positive RUQ abdominal pain.  No nausea, no vomiting. Positive diarrhea.  No constipation. Genitourinary: Negative for dysuria. Musculoskeletal: Negative for back pain. Skin: Negative for rash. Neurological: Negative for headaches, focal  weakness or numbness.  10-point ROS otherwise negative.  ____________________________________________   PHYSICAL EXAM:  VITAL SIGNS: ED Triage Vitals [07/24/18 1156]  Enc Vitals Group     BP (!) 166/90     Pulse Rate 84     Resp 16     Temp 98 F (36.7 C)     Temp Source Oral     SpO2 100 %     Weight 185 lb (83.9 kg)     Height 5\' 3"  (1.6 m)    Constitutional: Alert and oriented. Well appearing and in no acute distress. Eyes: Conjunctivae are normal.  Head: Atraumatic. Nose: No congestion/rhinnorhea. Mouth/Throat: Mucous membranes are moist. Neck: No stridor.   Cardiovascular: Normal rate,  regular rhythm. Good peripheral circulation. Grossly normal heart sounds.   Respiratory: Normal respiratory effort.  No retractions. Lungs CTAB. Gastrointestinal: Soft with mild tenderness in the RUQ. No rebound or guarding. No distention.  Musculoskeletal: No lower extremity tenderness nor edema. No gross deformities of extremities. Tenderness to palpation along the right anterior, lower ribs. No bruising or paradoxical movement.  Neurologic:  Normal speech and language. No gross focal neurologic deficits are appreciated.  Skin:  Skin is warm, dry and intact. No rash noted.  ____________________________________________   LABS (all labs ordered are listed, but only abnormal results are displayed)  Labs Reviewed  COMPREHENSIVE METABOLIC PANEL - Abnormal; Notable for the following components:      Result Value   Potassium 3.3 (*)    CO2 21 (*)    Total Protein 8.4 (*)    All other components within normal limits  URINALYSIS, ROUTINE W REFLEX MICROSCOPIC - Abnormal; Notable for the following components:   Specific Gravity, Urine >1.046 (*)    Hgb urine dipstick SMALL (*)    Bacteria, UA RARE (*)    All other components within normal limits  LIPASE, BLOOD  CBC  TROPONIN I  I-STAT BETA HCG BLOOD, ED ([facility], [facility], [facility] ONLY)   ____________________________________________  EKG   EKG Interpretation  Date/Time:  Friday Jul 24 2018 11:55:12 EDT Ventricular Rate:  79 PR Interval:  144 QRS Duration: 144 QT Interval:  426 QTC Calculation: 488 R Axis:   34 Text Interpretation:  Normal sinus rhythm Right bundle branch block Abnormal ECG No STEMI. No change from prior.  Confirmed by Nanda Quinton [phone]) on 07/24/2018 1:36:25 PM       ____________________________________________  RADIOLOGY  Dg Chest 2 View  Result Date: 07/24/2018 CLINICAL DATA:  Right lower chest pain EXAM: CHEST - 2 VIEW COMPARISON:  09/09/2016 FINDINGS: The heart size and mediastinal contours are within normal limits.  Both lungs are clear. The visualized skeletal structures are unremarkable. IMPRESSION: No acute abnormality of the lungs. Electronically Signed   By: Eddie Candle M.D.   On: 07/24/2018 13:59   Ct Abdomen Pelvis W Contrast  Result Date: 07/24/2018 CLINICAL DATA:  Right upper quadrant pain EXAM: CT ABDOMEN AND PELVIS WITH CONTRAST TECHNIQUE: Multidetector CT imaging of the abdomen and pelvis was performed using the standard protocol following bolus administration of intravenous contrast. CONTRAST:  167mL OMNIPAQUE IOHEXOL 300 MG/ML  SOLN COMPARISON:  None. FINDINGS: Lower chest: No acute abnormality. Hepatobiliary: Multiple small fluid attenuation lesions of the liver, likely simple cysts or hemangiomata, some too small to characterize. Status post cholecystectomy. No biliary dilatation. Pancreas: Unremarkable. No pancreatic ductal dilatation or surrounding inflammatory changes. Spleen: Normal in size without focal abnormality. Adrenals/Urinary Tract: Adrenal glands are unremarkable. Kidneys are normal, without renal  calculi, focal lesion, or hydronephrosis. Bladder is unremarkable. Stomach/Bowel: Stomach is within normal limits. Appendix appears normal. No evidence of bowel wall thickening, distention, or inflammatory changes. Sigmoid diverticulosis. Vascular/Lymphatic: No significant vascular findings are present. No enlarged abdominal or pelvic lymph nodes. Reproductive: Uterine fibroids. Other: Small fat containing umbilical hernia. No abdominopelvic ascites. Musculoskeletal: No acute or significant osseous findings. IMPRESSION: 1. No acute CT findings of the abdomen or pelvis to explain right upper quadrant pain. 2. Chronic, incidental, and postoperative findings as detailed above. Electronically Signed   By: Eddie Candle M.D.   On: 07/24/2018 14:25    ____________________________________________   PROCEDURES  Procedure(s) performed:    Procedures  None ____________________________________________   INITIAL IMPRESSION / ASSESSMENT AND PLAN / ED COURSE  Pertinent labs & imaging results that were available during my care of the patient were reviewed by me and considered in my medical decision making (see chart for details).   Patient presents to the emergency department with right upper quadrant abdominal/right lower chest pain.  Symptoms seem mostly focused over the right anterior ribs and the patient does have tenderness to palpation of this area.  Lower suspicion for ACS/PE.  Patient's EKG shows right bundle branch block which is typical of her prior EKGs. Mild RUQ tenderness on abdominal exam. Patient does have history of cholecystectomy.  Given that the pain overlaps two body cavities, plan for both CXR and CT abdomen/pelvis. Labs from triage are unremarkable.   CXR and CT without acute findings. Labs unremarkable. Plan to treat as MSK pain. Discussed ED return precautions.  ____________________________________________  FINAL CLINICAL IMPRESSION(S) / ED DIAGNOSES  Final diagnoses:  Right-sided chest pain  RUQ abdominal pain     MEDICATIONS GIVEN DURING THIS VISIT:  Medications  sodium chloride flush (NS) 0.9 % injection 3 mL (3 mLs Intravenous Given 07/24/18 1347)  iohexol (OMNIPAQUE) 300 MG/ML solution 100 mL (100 mLs Intravenous Contrast Given 07/24/18 1408)     NEW OUTPATIENT MEDICATIONS STARTED DURING THIS VISIT:  Discharge Medication List as of 07/24/2018  3:54 PM    START taking these medications   Details  diclofenac sodium (VOLTAREN) 1 % GEL Apply 2 g topically 4 (four) times daily as needed (chest wall pain)., Starting Fri 07/24/2018, Print        Note:  This document was prepared using Dragon voice recognition software and may include unintentional dictation errors.  Nanda Quinton, MD Emergency Medicine    Braelyn Bordonaro, [facility], MD 07/24/18 2104

## 2018-07-24 NOTE — Discharge Instructions (Signed)

## 2018-07-24 NOTE — ED Notes (Signed)
Culture sent down 

## 2018-07-24 NOTE — ED Notes (Signed)
Patient transported to X-ray 

## 2018-07-24 NOTE — ED Triage Notes (Signed)
Pt reports RUQ pain since Thursday, no gallbladder. No n.v but "white foamy spit". Pt a.o, nad noted

## 2018-08-06 ENCOUNTER — Ambulatory Visit: Payer: Medicare HMO | Admitting: Psychology

## 2018-08-13 DIAGNOSIS — I10 Essential (primary) hypertension: Secondary | ICD-10-CM | POA: Diagnosis not present

## 2018-08-13 DIAGNOSIS — M940 Chondrocostal junction syndrome [Tietze]: Secondary | ICD-10-CM | POA: Diagnosis not present

## 2018-08-13 DIAGNOSIS — E876 Hypokalemia: Secondary | ICD-10-CM | POA: Diagnosis not present

## 2018-09-22 DIAGNOSIS — I10 Essential (primary) hypertension: Secondary | ICD-10-CM | POA: Diagnosis not present

## 2018-09-22 DIAGNOSIS — L409 Psoriasis, unspecified: Secondary | ICD-10-CM | POA: Diagnosis not present

## 2018-09-22 DIAGNOSIS — Z1231 Encounter for screening mammogram for malignant neoplasm of breast: Secondary | ICD-10-CM | POA: Diagnosis not present

## 2018-09-22 DIAGNOSIS — Z0000 Encounter for general adult medical examination without abnormal findings: Secondary | ICD-10-CM | POA: Diagnosis not present

## 2018-09-22 DIAGNOSIS — E785 Hyperlipidemia, unspecified: Secondary | ICD-10-CM | POA: Diagnosis not present

## 2018-10-09 DIAGNOSIS — Z803 Family history of malignant neoplasm of breast: Secondary | ICD-10-CM | POA: Diagnosis not present

## 2018-10-09 DIAGNOSIS — Z1231 Encounter for screening mammogram for malignant neoplasm of breast: Secondary | ICD-10-CM | POA: Diagnosis not present

## 2019-01-08 DIAGNOSIS — K219 Gastro-esophageal reflux disease without esophagitis: Secondary | ICD-10-CM | POA: Diagnosis not present

## 2019-01-08 DIAGNOSIS — R21 Rash and other nonspecific skin eruption: Secondary | ICD-10-CM | POA: Diagnosis not present

## 2019-03-12 DIAGNOSIS — E785 Hyperlipidemia, unspecified: Secondary | ICD-10-CM | POA: Diagnosis not present

## 2019-03-12 DIAGNOSIS — I10 Essential (primary) hypertension: Secondary | ICD-10-CM | POA: Diagnosis not present

## 2019-07-23 ENCOUNTER — Other Ambulatory Visit: Payer: Self-pay

## 2019-07-23 ENCOUNTER — Encounter: Payer: Self-pay | Admitting: Cardiovascular Disease

## 2019-07-23 ENCOUNTER — Ambulatory Visit (INDEPENDENT_AMBULATORY_CARE_PROVIDER_SITE_OTHER): Payer: Medicare HMO | Admitting: Cardiovascular Disease

## 2019-07-23 VITALS — BP 124/78 | HR 83 | Temp 98.4°F | Ht 63.5 in | Wt 187.0 lb

## 2019-07-23 DIAGNOSIS — Z8673 Personal history of transient ischemic attack (TIA), and cerebral infarction without residual deficits: Secondary | ICD-10-CM | POA: Diagnosis not present

## 2019-07-23 DIAGNOSIS — R0789 Other chest pain: Secondary | ICD-10-CM | POA: Diagnosis not present

## 2019-07-23 DIAGNOSIS — E785 Hyperlipidemia, unspecified: Secondary | ICD-10-CM | POA: Diagnosis not present

## 2019-07-23 DIAGNOSIS — I10 Essential (primary) hypertension: Secondary | ICD-10-CM | POA: Diagnosis not present

## 2019-07-23 DIAGNOSIS — Z5181 Encounter for therapeutic drug level monitoring: Secondary | ICD-10-CM

## 2019-07-23 DIAGNOSIS — E7849 Other hyperlipidemia: Secondary | ICD-10-CM

## 2019-07-23 NOTE — Progress Notes (Signed)
Cardiology Office Note   Date:  [id number]   ID:  Ashley Lewis, DOB 1965/10/09, MRN [id number]  PCP:  [name], MD  Cardiologist:   Skeet Latch, MD   Chief Complaint  Patient presents with  . Follow-up    Last seen in 2018.  Marland Kitchen Chest Pain      History of Present Illness: Ashley Lewis is a 54 y.o. female ith hypertension, prior stroke, and THC use who presents for follow-up.  She was initially seen 05/2015 for an evaluation of chest pain.  Ashley Lewis reported R sided chest pain and shoulder pain that had been ongoing for months.  The pain was felt to be atypical no ischemia evaluation was performed at that time.  She is physically active by walking her dog regularly and had no chest pain or shortness of breath with that activity.  At her last appointment her chest pain was thought to be non-ischemic.  She was hypotensive and orthostatic.  Losartan was discontinued.    Ashley Lewis has been noticing chest pain for the last few months.  She feels it in the center of her chest and in the left.  She notes sharp pain and it can last for several minutes.  There is no associated shortness of breath.  She has diaphoresis with minimal exertion. The first and second toes on her R foot and her leg hurt.  The toes are numb.  She denies lower extremity edema, orthopnea or PND.  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  She does a lot of lifting with her daughter.  She wants to start walking soon.  She takes omeprazole for acid reflux but it isn't fully controlled.    Past Medical History:  Diagnosis Date  . Anemia    iron defi  . Atypical chest pain 08/05/2019  . Depression    sees Dr. Josetta Huddle  . Eczema   . GERD (gastroesophageal reflux disease)   . Hyperlipidemia   . Hypertension   . Psoriasis   . Stroke [facility]) 2011   left thalamic infarct    Past Surgical History:  Procedure Laterality Date  . CERVICAL CERCLAGE     x 2, in 1995, 1997  . CHOLECYSTECTOMY N/A  02/03/2015   Procedure: LAPAROSCOPIC CHOLECYSTECTOMY;  Surgeon: Arta Bruce Kinsinger, MD;  Location: [place];  Service: General;  Laterality: N/A;     Current Outpatient Medications  Medication Sig Dispense Refill  . amLODipine (NORVASC) 5 MG tablet Take 5 mg by mouth daily.    Marland Kitchen aspirin EC 81 MG tablet Take 81 mg by mouth daily.    Marland Kitchen atorvastatin (LIPITOR) 20 MG tablet Take 20 mg by mouth daily.     . diclofenac sodium (VOLTAREN) 1 % GEL Apply 2 g topically 4 (four) times daily as needed (chest wall pain). 1 Tube 0  . omeprazole (PRILOSEC) 20 MG capsule Take 20 mg by mouth daily.     No current facility-administered medications for this visit.    Allergies:   Lisinopril    Social History:  The patient  reports that she quit smoking about 9 years ago. She started smoking about 10 years ago. She smoked 0.50 packs per day. She has never used smokeless tobacco. She reports current drug use. Drug: Marijuana. She reports that she does not drink alcohol.   Family History:  The patient's family history includes Cancer in her maternal grandmother; Cancer (age of onset: 1) in her mother; Cancer (  age of onset: 77) in her father; Heart failure in her maternal grandfather.    ROS:  Please see the history of present illness.   Otherwise, review of systems are positive for none.   All other systems are reviewed and negative.    PHYSICAL EXAM: VS:  BP 124/78 (BP Location: Right Arm, Patient Position: Sitting, Cuff Size: Normal)   Pulse 83   Temp 98.4 F (36.9 C)   Ht 5' 3.5" (1.613 m)   Wt 187 lb (84.8 kg)   BMI 32.61 kg/m  , BMI Body mass index is 32.61 kg/m.  GENERAL:  Well appearing.  No acute distress.  HEENT: Pupils equal round and reactive, fundi not visualized, oral mucosa unremarkable NECK:  No jugular venous distention, waveform within normal limits, carotid upstroke brisk and symmetric, no bruits, no thyromegaly LUNGS:  Clear to auscultation bilaterally.   HEART:  RRR.  PMI not  displaced or sustained,S1 and S2 within normal limits, no S3, no S4, no clicks, no rubs, no murmurs ABD:  Flat, positive bowel sounds normal in frequency in pitch, no bruits, no rebound, no guarding, no midline pulsatile mass, no hepatomegaly, no splenomegaly EXT:  2 plus pulses throughout, no edema, no cyanosis no clubbing SKIN:  No rashes no nodules NEURO:  Cranial nerves II through XII grossly intact, motor grossly intact throughout PSYCH:  Cognitively intact, oriented to person place and time   EKG:  EKG is ordered today. The ekg ordered today demonstrates sinus rhythm rate 85 bpm.  RBBB. 02/25/17: Sinus rhythm.  Rate 76 bpm.  RBBB.  07/23/19: Sinus rhythm.  Rate 83 pm.  RBBB.   Recent Labs: 08/02/2019: ALT 24; BUN 11; Creatinine, Ser 0.66; Potassium 4.6; Sodium 141    Lipid Panel    Component Value Date/Time   CHOL 170 08/02/2019 1214   TRIG 85 08/02/2019 1214   HDL 47 08/02/2019 1214   CHOLHDL 3.6 08/02/2019 1214   CHOLHDL 4.3 08/12/2012 1210   VLDL 20 08/12/2012 1210   LDLCALC 107 (H) 08/02/2019 1214   09/03/16: Total cholesterol 173, triglycerides 106, HDL 41, LDL 111    Wt Readings from Last 3 Encounters:  07/23/19 187 lb (84.8 kg)  07/24/18 185 lb (83.9 kg)  04/02/18 169 lb (76.7 kg)      ASSESSMENT AND PLAN:  # Musculoskeletal chest pain:  Ashley Lewis continues to have very atypical chest pain.  It is clearly non-exertional.    # Hypertension: # Orthostatic hypotension: Blood pressure stable and dizziness has improved.  Continue amlodipine.   # Hyperlipidemia: Continue atorvastatin.  Check fasting lipids/CMP.    # Prior stroke: Continue aspirin and atorvastatin.  # Tobacco abuse: Not interested in quitting at this time.  She isn't ready.   Current medicines are reviewed at length with the patient today.  The patient does not have concerns regarding medicines.  The following changes have been made: none  Labs/ tests ordered today include:   Orders  Placed This Encounter  Procedures  . Lipid panel  . Comprehensive metabolic panel  . EKG 12-Lead     Disposition:   FU with Angelyne Terwilliger C. Oval Linsey, MD, [facility] in 1 year  This note was written with the assistance of speech recognition software.  Please excuse any transcriptional errors.  Signed, Windle Huebert C. Oval Linsey, MD, [facility]  08/05/2019 12:03 AM    [place]

## 2019-07-23 NOTE — Patient Instructions (Addendum)
Medication Instructions:  Your physician recommends that you continue on your current medications as directed. Please refer to the Current Medication list given to you today.  *If you need a refill on your cardiac medications before your next appointment, please call your pharmacy*  Lab Work: NONE   Testing/Procedures: NONE   Follow-Up: At [facility], you and your health needs are our priority.  As part of our continuing mission to provide you with exceptional heart care, we have created designated Provider Care Teams.  These Care Teams include your primary Cardiologist (physician) and Advanced Practice Providers (APPs -  Physician Assistants and Nurse Practitioners) who all work together to provide you with the care you need, when you need it.  We recommend signing up for the patient portal called "MyChart".  Sign up information is provided on this After Visit Summary.  MyChart is used to connect with patients for Virtual Visits (Telemedicine).  Patients are able to view lab/test results, encounter notes, upcoming appointments, etc.  Non-urgent messages can be sent to your provider as well.   To learn more about what you can do with MyChart, go to [email or web address].    Your next appointment:   12 month(s)  The format for your next appointment:   In Person  Provider:   You may see Skeet Latch, MD or one of the following Advanced Practice Providers on your designated Care Team:    Kerin Ransom, PA-C  [place], [place]  Coletta Memos, FNP    l

## 2019-08-02 DIAGNOSIS — Z5181 Encounter for therapeutic drug level monitoring: Secondary | ICD-10-CM | POA: Diagnosis not present

## 2019-08-02 DIAGNOSIS — E7849 Other hyperlipidemia: Secondary | ICD-10-CM | POA: Diagnosis not present

## 2019-08-02 DIAGNOSIS — I10 Essential (primary) hypertension: Secondary | ICD-10-CM | POA: Diagnosis not present

## 2019-08-03 LAB — COMPREHENSIVE METABOLIC PANEL
ALT: 24 IU/L (ref 0–32)
AST: 22 IU/L (ref 0–40)
Albumin/Globulin Ratio: 1.4 (ref 1.2–2.2)
Albumin: 4.5 g/dL (ref 3.8–4.9)
Alkaline Phosphatase: 73 IU/L (ref 39–117)
BUN/Creatinine Ratio: 17 (ref 9–23)
BUN: 11 mg/dL (ref 6–24)
Bilirubin Total: <0.2 mg/dL (ref 0.0–1.2)
CO2: 23 mmol/L (ref 20–29)
Calcium: 9.3 mg/dL (ref 8.7–10.2)
Chloride: 104 mmol/L (ref 96–106)
Creatinine, Ser: 0.66 mg/dL (ref 0.57–1.00)
GFR calc Af Amer: 117 mL/min/{1.73_m2} (ref >59)
GFR calc non Af Amer: 101 mL/min/{1.73_m2} (ref >59)
Globulin, Total: 3.3 g/dL (ref 1.5–4.5)
Glucose: 88 mg/dL (ref 65–99)
Potassium: 4.6 mmol/L (ref 3.5–5.2)
Sodium: 141 mmol/L (ref 134–144)
Total Protein: 7.8 g/dL (ref 6.0–8.5)

## 2019-08-03 LAB — LIPID PANEL
Chol/HDL Ratio: 3.6 ratio (ref 0.0–4.4)
Cholesterol, Total: 170 mg/dL (ref 100–199)
HDL: 47 mg/dL (ref >39)
LDL Chol Calc (NIH): 107 mg/dL — ABNORMAL HIGH (ref 0–99)
Triglycerides: 85 mg/dL (ref 0–149)
VLDL Cholesterol Cal: 16 mg/dL (ref 5–40)

## 2019-08-05 ENCOUNTER — Encounter: Payer: Self-pay | Admitting: Cardiovascular Disease

## 2019-08-05 DIAGNOSIS — R0789 Other chest pain: Secondary | ICD-10-CM

## 2019-08-05 HISTORY — DX: Other chest pain: R07.89

## 2019-08-11 ENCOUNTER — Telehealth: Payer: Self-pay | Admitting: *Deleted

## 2019-08-11 DIAGNOSIS — E7849 Other hyperlipidemia: Secondary | ICD-10-CM

## 2019-08-11 DIAGNOSIS — Z5181 Encounter for therapeutic drug level monitoring: Secondary | ICD-10-CM

## 2019-08-11 DIAGNOSIS — I10 Essential (primary) hypertension: Secondary | ICD-10-CM

## 2019-08-11 NOTE — Telephone Encounter (Signed)
Spoke with patient and her Atorvastatin is actually 40 mg already. She has been at this dose for about 6-7 months  Will forward to Dr Oval Linsey for review

## 2019-08-12 MED ORDER — ATORVASTATIN CALCIUM 80 MG PO TABS: 80.0000 mg | ORAL_TABLET | Freq: Every day | ORAL | 1 refills | Status: DC

## 2019-08-12 NOTE — Telephone Encounter (Signed)
Per Dr Oval Linsey increase to Atorvastatin 80 mg daily  Advised patient, verbalized understanding

## 2019-08-12 NOTE — Telephone Encounter (Signed)
-----   Message from Skeet Latch, MD sent at 08/12/2019  4:10 PM EDT ----- Increase to 80mg . ----- Message ----- From: Earvin Hansen, LPN Sent: [id number]  10:47 AM EDT To: Skeet Latch, MD  Spoke with patient and her Atorvastatin is actually 40 mg  daily already. She has been at this dose for about 6-7 months  Will forward to Dr Oval Linsey for review

## 2019-09-23 DIAGNOSIS — L409 Psoriasis, unspecified: Secondary | ICD-10-CM | POA: Diagnosis not present

## 2019-09-23 DIAGNOSIS — I10 Essential (primary) hypertension: Secondary | ICD-10-CM | POA: Diagnosis not present

## 2019-09-23 DIAGNOSIS — Z1231 Encounter for screening mammogram for malignant neoplasm of breast: Secondary | ICD-10-CM | POA: Diagnosis not present

## 2019-09-23 DIAGNOSIS — E785 Hyperlipidemia, unspecified: Secondary | ICD-10-CM | POA: Diagnosis not present

## 2019-09-23 DIAGNOSIS — G8929 Other chronic pain: Secondary | ICD-10-CM | POA: Diagnosis not present

## 2019-09-23 DIAGNOSIS — M5441 Lumbago with sciatica, right side: Secondary | ICD-10-CM | POA: Diagnosis not present

## 2019-09-23 DIAGNOSIS — Z0000 Encounter for general adult medical examination without abnormal findings: Secondary | ICD-10-CM | POA: Diagnosis not present

## 2019-09-23 DIAGNOSIS — F1210 Cannabis abuse, uncomplicated: Secondary | ICD-10-CM | POA: Diagnosis not present

## 2019-10-15 DIAGNOSIS — Z1231 Encounter for screening mammogram for malignant neoplasm of breast: Secondary | ICD-10-CM | POA: Diagnosis not present

## 2019-10-30 DIAGNOSIS — Z03818 Encounter for observation for suspected exposure to other biological agents ruled out: Secondary | ICD-10-CM | POA: Diagnosis not present

## 2019-10-30 DIAGNOSIS — Z20822 Contact with and (suspected) exposure to covid-19: Secondary | ICD-10-CM | POA: Diagnosis not present

## 2019-12-14 DIAGNOSIS — E785 Hyperlipidemia, unspecified: Secondary | ICD-10-CM | POA: Diagnosis not present

## 2019-12-14 DIAGNOSIS — L409 Psoriasis, unspecified: Secondary | ICD-10-CM | POA: Diagnosis not present

## 2019-12-14 DIAGNOSIS — G8929 Other chronic pain: Secondary | ICD-10-CM | POA: Diagnosis not present

## 2019-12-14 DIAGNOSIS — F1210 Cannabis abuse, uncomplicated: Secondary | ICD-10-CM | POA: Diagnosis not present

## 2019-12-14 DIAGNOSIS — I10 Essential (primary) hypertension: Secondary | ICD-10-CM | POA: Diagnosis not present

## 2019-12-14 DIAGNOSIS — M5441 Lumbago with sciatica, right side: Secondary | ICD-10-CM | POA: Diagnosis not present

## 2019-12-14 DIAGNOSIS — Z79899 Other long term (current) drug therapy: Secondary | ICD-10-CM | POA: Diagnosis not present

## 2019-12-14 DIAGNOSIS — K5790 Diverticulosis of intestine, part unspecified, without perforation or abscess without bleeding: Secondary | ICD-10-CM | POA: Diagnosis not present

## 2020-02-04 ENCOUNTER — Other Ambulatory Visit: Payer: Self-pay | Admitting: Cardiovascular Disease

## 2020-02-29 ENCOUNTER — Other Ambulatory Visit: Payer: Self-pay

## 2020-02-29 ENCOUNTER — Ambulatory Visit: Payer: Medicare HMO | Admitting: Dermatology

## 2020-02-29 DIAGNOSIS — L409 Psoriasis, unspecified: Secondary | ICD-10-CM

## 2020-02-29 MED ORDER — AZITHROMYCIN 500 MG PO TABS: ORAL_TABLET | ORAL | 0 refills | Status: DC

## 2020-02-29 MED ORDER — BETAMETHASONE DIPROPIONATE AUG 0.05 % EX OINT: TOPICAL_OINTMENT | Freq: Two times a day (BID) | CUTANEOUS | 1 refills | Status: DC

## 2020-02-29 MED ORDER — HYDROCORTISONE 2.5 % EX OINT: TOPICAL_OINTMENT | CUTANEOUS | 1 refills | Status: DC

## 2020-02-29 NOTE — Progress Notes (Signed)
   Follow-Up Visit   Subjective  Ashley Lewis is a 54 y.o. female who presents for the following: Psoriasis. Flaring again, patient is concerned she may have been bitten by a bug which has caused her to flare again. In the past she was on Humira which did seem to help, but her insurance changed so she stopped the medication. She is currently using Triamcinolone 0.1% ointment through out the day, but continues to have itchy, burning plaques. Patient states that the last time she had a significant flare she was treated for a strep. infection and the antibiotic improved her psoriasis.    The following portions of the chart were reviewed this encounter and updated as appropriate:   Tobacco  Allergies  Meds  Problems  Med Hx  Surg Hx  Fam Hx      Review of Systems:  No other skin or systemic complaints except as noted in HPI or Assessment and Plan.  Objective  Well appearing patient in no apparent distress; mood and affect are within normal limits.  A focused examination was performed including B/L legs . Relevant physical exam findings are noted in the Assessment and Plan.  Objective  B/L legs and toenails: Toenail dystrophy. Plaques of the B/L lower legs.  Assessment & Plan  Psoriasis B/L legs and toenails  With joint pain. Flare may be related to strep infection patient states that we treated her in the past for strep infection and condition improved.  Chronic condition with expected duration over one year. Condition is bothersome to patient. Currently flared.  Psoriasis is a chronic non-curable, but treatable genetic/hereditary disease that may have other systemic features affecting other organ systems such as joints (Psoriatic Arthritis). It is associated with an increased risk of inflammatory bowel disease, heart disease, non-alcoholic fatty liver disease, and depression.    Will refer to rheumatologist to evaluate for psoriatic arthritis due to joint pain.   Start  Augmented Betamethasone ointment BID up to 3 weeks. Then decrease to weekends only.   Start HC 2.5% ointment aa's groin BID.   Start Azithromycin 500 mg daily for 5 days.   Discussed toenail culture to r/o tinea, but not recommended at this time since the patient's toenails are too short.   augmented betamethasone dipropionate (DIPROLENE-AF) 0.05 % ointment - B/L legs and toenails  hydrocortisone 2.5 % ointment - B/L legs and toenails  azithromycin (ZITHROMAX) 500 MG tablet - B/L legs and toenails  Other Related Procedures Ambulatory referral to Rheumatology  Return in about 1 month (around 03/31/2020) for psoriasis follow up .  Luther Redo, CMA, am acting as scribe for Forest Gleason, MD .  Documentation: I have reviewed the above documentation for accuracy and completeness, and I agree with the above.  Forest Gleason, MD

## 2020-03-22 ENCOUNTER — Encounter: Payer: Self-pay | Admitting: Dermatology

## 2020-03-30 DIAGNOSIS — K5790 Diverticulosis of intestine, part unspecified, without perforation or abscess without bleeding: Secondary | ICD-10-CM | POA: Diagnosis not present

## 2020-03-30 DIAGNOSIS — L409 Psoriasis, unspecified: Secondary | ICD-10-CM | POA: Diagnosis not present

## 2020-03-30 DIAGNOSIS — E785 Hyperlipidemia, unspecified: Secondary | ICD-10-CM | POA: Diagnosis not present

## 2020-03-30 DIAGNOSIS — Z79899 Other long term (current) drug therapy: Secondary | ICD-10-CM | POA: Diagnosis not present

## 2020-03-30 DIAGNOSIS — M5441 Lumbago with sciatica, right side: Secondary | ICD-10-CM | POA: Diagnosis not present

## 2020-03-30 DIAGNOSIS — I10 Essential (primary) hypertension: Secondary | ICD-10-CM | POA: Diagnosis not present

## 2020-03-30 DIAGNOSIS — G8929 Other chronic pain: Secondary | ICD-10-CM | POA: Diagnosis not present

## 2020-03-30 DIAGNOSIS — R220 Localized swelling, mass and lump, head: Secondary | ICD-10-CM | POA: Diagnosis not present

## 2020-03-30 DIAGNOSIS — F1210 Cannabis abuse, uncomplicated: Secondary | ICD-10-CM | POA: Diagnosis not present

## 2020-04-12 DIAGNOSIS — L439 Lichen planus, unspecified: Secondary | ICD-10-CM | POA: Diagnosis not present

## 2020-04-16 NOTE — Progress Notes (Deleted)
Office Visit Note  Patient: Ashley Lewis             Date of Birth: [birth year]           MRN: [id number]             PCP: [name], MD Referring: Alfonso Patten, MD Visit Date: 04/17/2020 Occupation: @GUAROCC @  Subjective:  No chief complaint on file.   History of Present Illness: Ashley Lewis is a 55 y.o. female with a history of HLD, HTN, GERD, anemia, CVA, and plaque psoriasis here for evaluation of psoriatic arthritis.***   Activities of Daily Living:  Patient reports morning stiffness for *** {minute/hour:19697}.   Patient {ACTIONS;DENIES/REPORTS:21021675::"Denies"} nocturnal pain.  Difficulty dressing/grooming: {ACTIONS;DENIES/REPORTS:21021675::"Denies"} Difficulty climbing stairs: {ACTIONS;DENIES/REPORTS:21021675::"Denies"} Difficulty getting out of chair: {ACTIONS;DENIES/REPORTS:21021675::"Denies"} Difficulty using hands for taps, buttons, cutlery, and/or writing: {ACTIONS;DENIES/REPORTS:21021675::"Denies"}  No Rheumatology ROS completed.   PMFS History:  Patient Active Problem List   Diagnosis Date Noted  . Atypical chest pain 08/05/2019  . RBBB 04/10/2017  . Psoriasis   . Hypertension   . Hyperlipidemia   . GERD (gastroesophageal reflux disease)   . Eczema   . Depression   . Anemia   . Biliary colic 65/78/4696  . Neck pain on right side 09/02/2012  . Left arm pain 09/02/2012  . Dyslipidemia 09/02/2012  . 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 Amenorrhea 08/12/2012  . Essential hypertension, benign 08/12/2012  . History of CVA (cerebrovascular accident) 07/06/2009  . MOOD SWINGS 05/09/2009  . TOBACCO USER 02/14/2009  . DEPRESSION, MODERATE, RECURRENT 10/05/2008  . ADJ DISORDER WITH MIXED ANXIETY & DEPRESSED MOOD 08/30/2008  . OVERWEIGHT 02/11/2007  . ANEMIA, IRON DEFICIENCY 02/11/2007    Past Medical History:  Diagnosis Date  . Anemia    iron defi  . Atypical chest pain 08/05/2019  . Depression    sees Dr. Josetta Huddle  . Eczema   . GERD  (gastroesophageal reflux disease)   . Hyperlipidemia   . Hypertension   . Psoriasis   . Stroke [facility]) 2011   left thalamic infarct    Family History  Problem Relation Age of Onset  . Cancer Mother 33       breast cancer x 2  . Cancer Father 63       pancreatic  . Cancer Maternal Grandmother   . Heart failure Maternal Grandfather    Past Surgical History:  Procedure Laterality Date  . CERVICAL CERCLAGE     x 2, in 1995, 1997  . CHOLECYSTECTOMY N/A 02/03/2015   Procedure: LAPAROSCOPIC CHOLECYSTECTOMY;  Surgeon: Arta Bruce Kinsinger, MD;  Location: [facility] OR;  Service: General;  Laterality: N/A;   Social History   Social History Narrative   Divorced, mother of 4 children, 3 still live with patient. Has 48 yo daughter with severe MR and special needs.    Does not work outside home. Working to try for disability, has lawyers currently.            Epworth Sleepiness Scale = 6 (as of 05/26/2015)   Immunization History  Administered Date(s) Administered  . Td 11/16/2009     Objective: Vital Signs: There were no vitals taken for this visit.   Physical Exam   Musculoskeletal Exam: ***  CDAI Exam: CDAI Score: - Patient Global: -; Provider Global: - Swollen: -; Tender: - Joint Exam 04/17/2020   No joint exam has been documented for this visit   There is currently no information documented on the homunculus. Go to  the Rheumatology activity and complete the homunculus joint exam.  Investigation: No additional findings.  Imaging: No results found.  Recent Labs: Lab Results  Component Value Date   WBC 9.9 07/24/2018   HGB 13.4 07/24/2018   PLT 235 07/24/2018   NA 141 08/02/2019   K 4.6 08/02/2019   CL 104 08/02/2019   CO2 23 08/02/2019   GLUCOSE 88 08/02/2019   BUN 11 08/02/2019   CREATININE 0.66 08/02/2019   BILITOT <0.2 08/02/2019   ALKPHOS 73 08/02/2019   AST 22 08/02/2019   ALT 24 08/02/2019   PROT 7.8 08/02/2019   ALBUMIN 4.5 08/02/2019   CALCIUM 9.3  08/02/2019   GFRAA 117 08/02/2019    Speciality Comments: No specialty comments available.  Procedures:  No procedures performed Allergies: Lisinopril   Assessment / Plan:     Visit Diagnoses: No diagnosis found.  Orders: No orders of the defined types were placed in this encounter.  No orders of the defined types were placed in this encounter.   Face-to-face time spent with patient was *** minutes. Greater than 50% of time was spent in counseling and coordination of care.  Follow-Up Instructions: No follow-ups on file.   Collier Salina, MD  Note - This record has been created using [employer].  Chart creation errors have been sought, but may not always  have been located. Such creation errors do not reflect on  the standard of medical care.

## 2020-04-17 ENCOUNTER — Other Ambulatory Visit: Payer: Self-pay

## 2020-04-17 ENCOUNTER — Ambulatory Visit: Payer: Medicare Other | Admitting: Internal Medicine

## 2020-06-13 DIAGNOSIS — L409 Psoriasis, unspecified: Secondary | ICD-10-CM | POA: Diagnosis not present

## 2020-06-13 DIAGNOSIS — Z5181 Encounter for therapeutic drug level monitoring: Secondary | ICD-10-CM | POA: Diagnosis not present

## 2020-08-01 DIAGNOSIS — G8929 Other chronic pain: Secondary | ICD-10-CM | POA: Diagnosis not present

## 2020-08-01 DIAGNOSIS — K5790 Diverticulosis of intestine, part unspecified, without perforation or abscess without bleeding: Secondary | ICD-10-CM | POA: Diagnosis not present

## 2020-08-01 DIAGNOSIS — M5441 Lumbago with sciatica, right side: Secondary | ICD-10-CM | POA: Diagnosis not present

## 2020-08-01 DIAGNOSIS — L409 Psoriasis, unspecified: Secondary | ICD-10-CM | POA: Diagnosis not present

## 2020-08-01 DIAGNOSIS — F1210 Cannabis abuse, uncomplicated: Secondary | ICD-10-CM | POA: Diagnosis not present

## 2020-08-01 DIAGNOSIS — M130 Polyarthritis, unspecified: Secondary | ICD-10-CM | POA: Diagnosis not present

## 2020-08-01 DIAGNOSIS — Z8673 Personal history of transient ischemic attack (TIA), and cerebral infarction without residual deficits: Secondary | ICD-10-CM | POA: Diagnosis not present

## 2020-08-01 DIAGNOSIS — I10 Essential (primary) hypertension: Secondary | ICD-10-CM | POA: Diagnosis not present

## 2020-08-01 DIAGNOSIS — R109 Unspecified abdominal pain: Secondary | ICD-10-CM | POA: Diagnosis not present

## 2020-08-01 DIAGNOSIS — Z79899 Other long term (current) drug therapy: Secondary | ICD-10-CM | POA: Diagnosis not present

## 2020-08-01 DIAGNOSIS — E785 Hyperlipidemia, unspecified: Secondary | ICD-10-CM | POA: Diagnosis not present

## 2020-08-17 DIAGNOSIS — M2550 Pain in unspecified joint: Secondary | ICD-10-CM | POA: Diagnosis not present

## 2020-08-17 DIAGNOSIS — L409 Psoriasis, unspecified: Secondary | ICD-10-CM | POA: Diagnosis not present

## 2020-08-17 DIAGNOSIS — E669 Obesity, unspecified: Secondary | ICD-10-CM | POA: Diagnosis not present

## 2020-08-17 DIAGNOSIS — M545 Low back pain, unspecified: Secondary | ICD-10-CM | POA: Diagnosis not present

## 2020-08-17 DIAGNOSIS — Z6832 Body mass index (BMI) 32.0-32.9, adult: Secondary | ICD-10-CM | POA: Diagnosis not present

## 2020-08-31 DIAGNOSIS — K5900 Constipation, unspecified: Secondary | ICD-10-CM | POA: Diagnosis not present

## 2020-08-31 DIAGNOSIS — R1012 Left upper quadrant pain: Secondary | ICD-10-CM | POA: Diagnosis not present

## 2020-08-31 DIAGNOSIS — K219 Gastro-esophageal reflux disease without esophagitis: Secondary | ICD-10-CM | POA: Diagnosis not present

## 2020-08-31 DIAGNOSIS — Z9049 Acquired absence of other specified parts of digestive tract: Secondary | ICD-10-CM | POA: Diagnosis not present

## 2020-08-31 DIAGNOSIS — Z86010 Personal history of colonic polyps: Secondary | ICD-10-CM | POA: Diagnosis not present

## 2020-09-28 DIAGNOSIS — I10 Essential (primary) hypertension: Secondary | ICD-10-CM | POA: Diagnosis not present

## 2020-09-28 DIAGNOSIS — K5790 Diverticulosis of intestine, part unspecified, without perforation or abscess without bleeding: Secondary | ICD-10-CM | POA: Diagnosis not present

## 2020-09-28 DIAGNOSIS — Z23 Encounter for immunization: Secondary | ICD-10-CM | POA: Diagnosis not present

## 2020-09-28 DIAGNOSIS — E785 Hyperlipidemia, unspecified: Secondary | ICD-10-CM | POA: Diagnosis not present

## 2020-09-28 DIAGNOSIS — L409 Psoriasis, unspecified: Secondary | ICD-10-CM | POA: Diagnosis not present

## 2020-09-28 DIAGNOSIS — R109 Unspecified abdominal pain: Secondary | ICD-10-CM | POA: Diagnosis not present

## 2020-09-28 DIAGNOSIS — F1210 Cannabis abuse, uncomplicated: Secondary | ICD-10-CM | POA: Diagnosis not present

## 2020-09-28 DIAGNOSIS — M130 Polyarthritis, unspecified: Secondary | ICD-10-CM | POA: Diagnosis not present

## 2020-09-28 DIAGNOSIS — M5441 Lumbago with sciatica, right side: Secondary | ICD-10-CM | POA: Diagnosis not present

## 2020-09-28 DIAGNOSIS — G8929 Other chronic pain: Secondary | ICD-10-CM | POA: Diagnosis not present

## 2020-09-28 DIAGNOSIS — Z79899 Other long term (current) drug therapy: Secondary | ICD-10-CM | POA: Diagnosis not present

## 2020-09-28 DIAGNOSIS — Z0000 Encounter for general adult medical examination without abnormal findings: Secondary | ICD-10-CM | POA: Diagnosis not present

## 2020-10-20 DIAGNOSIS — Z1231 Encounter for screening mammogram for malignant neoplasm of breast: Secondary | ICD-10-CM | POA: Diagnosis not present

## 2020-10-25 ENCOUNTER — Other Ambulatory Visit: Payer: Self-pay | Admitting: Cardiovascular Disease

## 2020-11-07 DIAGNOSIS — K5900 Constipation, unspecified: Secondary | ICD-10-CM | POA: Diagnosis not present

## 2020-11-07 DIAGNOSIS — R1012 Left upper quadrant pain: Secondary | ICD-10-CM | POA: Diagnosis not present

## 2020-11-07 DIAGNOSIS — K219 Gastro-esophageal reflux disease without esophagitis: Secondary | ICD-10-CM | POA: Diagnosis not present

## 2020-11-07 DIAGNOSIS — Z86010 Personal history of colonic polyps: Secondary | ICD-10-CM | POA: Diagnosis not present

## 2020-11-07 DIAGNOSIS — Z9049 Acquired absence of other specified parts of digestive tract: Secondary | ICD-10-CM | POA: Diagnosis not present

## 2020-12-14 ENCOUNTER — Emergency Department (HOSPITAL_COMMUNITY)
Admission: EM | Admit: 2020-12-14 | Discharge: 2020-12-15 | Disposition: A | Discharge status: Home / Self Care | Payer: Medicare (Managed Care) | Attending: Emergency Medicine | Admitting: Emergency Medicine

## 2020-12-14 ENCOUNTER — Other Ambulatory Visit: Payer: Self-pay

## 2020-12-14 ENCOUNTER — Encounter (HOSPITAL_COMMUNITY): Payer: Self-pay | Admitting: Emergency Medicine

## 2020-12-14 ENCOUNTER — Emergency Department (HOSPITAL_COMMUNITY): Payer: Medicare (Managed Care)

## 2020-12-14 DIAGNOSIS — R0789 Other chest pain: Secondary | ICD-10-CM | POA: Diagnosis not present

## 2020-12-14 DIAGNOSIS — I10 Essential (primary) hypertension: Secondary | ICD-10-CM | POA: Diagnosis not present

## 2020-12-14 DIAGNOSIS — R079 Chest pain, unspecified: Secondary | ICD-10-CM | POA: Diagnosis not present

## 2020-12-14 DIAGNOSIS — M791 Myalgia, unspecified site: Secondary | ICD-10-CM | POA: Diagnosis not present

## 2020-12-14 DIAGNOSIS — Z5321 Procedure and treatment not carried out due to patient leaving prior to being seen by health care provider: Secondary | ICD-10-CM | POA: Insufficient documentation

## 2020-12-14 LAB — CBC
HCT: 39.3 % (ref 36.0–46.0)
Hemoglobin: 12.9 g/dL (ref 12.0–15.0)
MCH: 28 pg (ref 26.0–34.0)
MCHC: 32.8 g/dL (ref 30.0–36.0)
MCV: 85.4 fL (ref 80.0–100.0)
Platelets: 261 10*3/uL (ref 150–400)
RBC: 4.6 MIL/uL (ref 3.87–5.11)
RDW: 13.1 % (ref 11.5–15.5)
WBC: 8.5 10*3/uL (ref 4.0–10.5)
nRBC: 0 % (ref 0.0–0.2)

## 2020-12-14 LAB — BASIC METABOLIC PANEL
Anion gap: 7 (ref 5–15)
BUN: 13 mg/dL (ref 6–20)
CO2: 28 mmol/L (ref 22–32)
Calcium: 9.2 mg/dL (ref 8.9–10.3)
Chloride: 104 mmol/L (ref 98–111)
Creatinine, Ser: 0.78 mg/dL (ref 0.44–1.00)
GFR, Estimated: >60 mL/min (ref >60)
Glucose, Bld: 87 mg/dL (ref 70–99)
Potassium: 3.1 mmol/L — ABNORMAL LOW (ref 3.5–5.1)
Sodium: 139 mmol/L (ref 135–145)

## 2020-12-14 LAB — TROPONIN I (HIGH SENSITIVITY)
Troponin I (High Sensitivity): 4 ng/L (ref <18)
Troponin I (High Sensitivity): 4 ng/L (ref <18)

## 2020-12-14 NOTE — ED Triage Notes (Signed)
Patient coming from home, complaint of generally not feeling well and HTN. Pt states BP has been running high, BP has been between 130-160. VSS. NAD.

## 2020-12-14 NOTE — ED Provider Notes (Signed)
Emergency Medicine Provider Triage Evaluation Note  Ashley Lewis , a 55 y.o. female  was evaluated in triage.  Pt complains of chest pain.  Patient states that symptoms began 2 days ago, pain feels like pressure and radiates into the left shoulder and arm.  She endorses associated nausea and diaphoresis.  Called her doctor for management of higher blood pressures than normal who referred her here for cardiac work-up.   Review of Systems  Positive: Chest pain, shortness of breath, nausea, diaphoresis, cough Negative: Fevers, chills, vomiting  Physical Exam  BP (!) 169/89 (BP Location: Left Arm)   Pulse (!) 103   Temp 98.2 F (36.8 C) (Oral)   Resp 16   Ht 5\' 2"  (1.575 m)   Wt 80.3 kg   SpO2 98%   BMI 32.37 kg/m  Gen:   Awake, no distress   Resp:  Normal effort  MSK:   Moves extremities without difficulty   Medical Decision Making  Medically screening exam initiated at 5:40 PM.  Appropriate orders placed.  [name] was informed that the remainder of the evaluation will be completed by another provider, this initial triage assessment does not replace that evaluation, and the importance of remaining in the ED until their evaluation is complete.     [name] 12/14/20 [place], [place], DO 12/14/20 1758

## 2020-12-14 NOTE — ED Notes (Signed)
Pt stated she could not wait any longer, LWBS

## 2021-01-04 DIAGNOSIS — F1210 Cannabis abuse, uncomplicated: Secondary | ICD-10-CM | POA: Diagnosis not present

## 2021-01-04 DIAGNOSIS — R109 Unspecified abdominal pain: Secondary | ICD-10-CM | POA: Diagnosis not present

## 2021-01-04 DIAGNOSIS — M130 Polyarthritis, unspecified: Secondary | ICD-10-CM | POA: Diagnosis not present

## 2021-01-04 DIAGNOSIS — M5441 Lumbago with sciatica, right side: Secondary | ICD-10-CM | POA: Diagnosis not present

## 2021-01-04 DIAGNOSIS — K5790 Diverticulosis of intestine, part unspecified, without perforation or abscess without bleeding: Secondary | ICD-10-CM | POA: Diagnosis not present

## 2021-01-04 DIAGNOSIS — Z8673 Personal history of transient ischemic attack (TIA), and cerebral infarction without residual deficits: Secondary | ICD-10-CM | POA: Diagnosis not present

## 2021-01-04 DIAGNOSIS — L409 Psoriasis, unspecified: Secondary | ICD-10-CM | POA: Diagnosis not present

## 2021-01-04 DIAGNOSIS — R21 Rash and other nonspecific skin eruption: Secondary | ICD-10-CM | POA: Diagnosis not present

## 2021-01-04 DIAGNOSIS — E785 Hyperlipidemia, unspecified: Secondary | ICD-10-CM | POA: Diagnosis not present

## 2021-01-04 DIAGNOSIS — I10 Essential (primary) hypertension: Secondary | ICD-10-CM | POA: Diagnosis not present

## 2021-01-04 DIAGNOSIS — G8929 Other chronic pain: Secondary | ICD-10-CM | POA: Diagnosis not present

## 2021-01-04 DIAGNOSIS — Z79899 Other long term (current) drug therapy: Secondary | ICD-10-CM | POA: Diagnosis not present

## 2021-01-18 DIAGNOSIS — L409 Psoriasis, unspecified: Secondary | ICD-10-CM | POA: Diagnosis not present

## 2021-01-18 DIAGNOSIS — E785 Hyperlipidemia, unspecified: Secondary | ICD-10-CM | POA: Diagnosis not present

## 2021-01-25 DIAGNOSIS — M130 Polyarthritis, unspecified: Secondary | ICD-10-CM | POA: Diagnosis not present

## 2021-01-25 DIAGNOSIS — K5790 Diverticulosis of intestine, part unspecified, without perforation or abscess without bleeding: Secondary | ICD-10-CM | POA: Diagnosis not present

## 2021-01-25 DIAGNOSIS — G8929 Other chronic pain: Secondary | ICD-10-CM | POA: Diagnosis not present

## 2021-01-25 DIAGNOSIS — L409 Psoriasis, unspecified: Secondary | ICD-10-CM | POA: Diagnosis not present

## 2021-01-25 DIAGNOSIS — I10 Essential (primary) hypertension: Secondary | ICD-10-CM | POA: Diagnosis not present

## 2021-01-25 DIAGNOSIS — M5441 Lumbago with sciatica, right side: Secondary | ICD-10-CM | POA: Diagnosis not present

## 2021-01-25 DIAGNOSIS — Z8673 Personal history of transient ischemic attack (TIA), and cerebral infarction without residual deficits: Secondary | ICD-10-CM | POA: Diagnosis not present

## 2021-01-25 DIAGNOSIS — R21 Rash and other nonspecific skin eruption: Secondary | ICD-10-CM | POA: Diagnosis not present

## 2021-01-25 DIAGNOSIS — E785 Hyperlipidemia, unspecified: Secondary | ICD-10-CM | POA: Diagnosis not present

## 2021-01-25 DIAGNOSIS — Z79899 Other long term (current) drug therapy: Secondary | ICD-10-CM | POA: Diagnosis not present

## 2021-01-25 DIAGNOSIS — F1210 Cannabis abuse, uncomplicated: Secondary | ICD-10-CM | POA: Diagnosis not present

## 2021-01-25 DIAGNOSIS — R109 Unspecified abdominal pain: Secondary | ICD-10-CM | POA: Diagnosis not present

## 2021-04-05 ENCOUNTER — Other Ambulatory Visit: Payer: Self-pay

## 2021-04-05 ENCOUNTER — Other Ambulatory Visit: Payer: Self-pay | Admitting: Family Medicine

## 2021-04-05 ENCOUNTER — Ambulatory Visit
Admission: RE | Admit: 2021-04-05 | Discharge: 2021-04-05 | Disposition: A | Discharge status: Home / Self Care | Payer: Medicare (Managed Care) | Source: Ambulatory Visit | Attending: Family Medicine | Admitting: Family Medicine

## 2021-04-05 DIAGNOSIS — M5416 Radiculopathy, lumbar region: Secondary | ICD-10-CM

## 2021-04-05 DIAGNOSIS — L409 Psoriasis, unspecified: Secondary | ICD-10-CM | POA: Diagnosis not present

## 2021-04-05 DIAGNOSIS — G8929 Other chronic pain: Secondary | ICD-10-CM | POA: Diagnosis not present

## 2021-04-05 DIAGNOSIS — R21 Rash and other nonspecific skin eruption: Secondary | ICD-10-CM | POA: Diagnosis not present

## 2021-04-05 DIAGNOSIS — Z79899 Other long term (current) drug therapy: Secondary | ICD-10-CM | POA: Diagnosis not present

## 2021-04-05 DIAGNOSIS — I10 Essential (primary) hypertension: Secondary | ICD-10-CM | POA: Diagnosis not present

## 2021-04-05 DIAGNOSIS — Z8673 Personal history of transient ischemic attack (TIA), and cerebral infarction without residual deficits: Secondary | ICD-10-CM | POA: Diagnosis not present

## 2021-04-05 DIAGNOSIS — F1210 Cannabis abuse, uncomplicated: Secondary | ICD-10-CM | POA: Diagnosis not present

## 2021-04-05 DIAGNOSIS — Z9049 Acquired absence of other specified parts of digestive tract: Secondary | ICD-10-CM | POA: Diagnosis not present

## 2021-04-05 DIAGNOSIS — K5790 Diverticulosis of intestine, part unspecified, without perforation or abscess without bleeding: Secondary | ICD-10-CM | POA: Diagnosis not present

## 2021-04-05 DIAGNOSIS — M130 Polyarthritis, unspecified: Secondary | ICD-10-CM | POA: Diagnosis not present

## 2021-04-05 DIAGNOSIS — E785 Hyperlipidemia, unspecified: Secondary | ICD-10-CM | POA: Diagnosis not present

## 2021-05-21 DIAGNOSIS — F1210 Cannabis abuse, uncomplicated: Secondary | ICD-10-CM | POA: Diagnosis not present

## 2021-05-21 DIAGNOSIS — I10 Essential (primary) hypertension: Secondary | ICD-10-CM | POA: Diagnosis not present

## 2021-05-21 DIAGNOSIS — Z8673 Personal history of transient ischemic attack (TIA), and cerebral infarction without residual deficits: Secondary | ICD-10-CM | POA: Diagnosis not present

## 2021-05-21 DIAGNOSIS — K5790 Diverticulosis of intestine, part unspecified, without perforation or abscess without bleeding: Secondary | ICD-10-CM | POA: Diagnosis not present

## 2021-05-21 DIAGNOSIS — R635 Abnormal weight gain: Secondary | ICD-10-CM | POA: Diagnosis not present

## 2021-05-21 DIAGNOSIS — R21 Rash and other nonspecific skin eruption: Secondary | ICD-10-CM | POA: Diagnosis not present

## 2021-05-21 DIAGNOSIS — G8929 Other chronic pain: Secondary | ICD-10-CM | POA: Diagnosis not present

## 2021-05-21 DIAGNOSIS — M5416 Radiculopathy, lumbar region: Secondary | ICD-10-CM | POA: Diagnosis not present

## 2021-05-21 DIAGNOSIS — M130 Polyarthritis, unspecified: Secondary | ICD-10-CM | POA: Diagnosis not present

## 2021-05-21 DIAGNOSIS — E785 Hyperlipidemia, unspecified: Secondary | ICD-10-CM | POA: Diagnosis not present

## 2021-05-21 DIAGNOSIS — L409 Psoriasis, unspecified: Secondary | ICD-10-CM | POA: Diagnosis not present

## 2021-05-21 DIAGNOSIS — Z79899 Other long term (current) drug therapy: Secondary | ICD-10-CM | POA: Diagnosis not present

## 2021-05-24 DIAGNOSIS — L2089 Other atopic dermatitis: Secondary | ICD-10-CM | POA: Diagnosis not present

## 2021-05-24 DIAGNOSIS — L400 Psoriasis vulgaris: Secondary | ICD-10-CM | POA: Diagnosis not present

## 2021-05-25 DIAGNOSIS — L2089 Other atopic dermatitis: Secondary | ICD-10-CM | POA: Diagnosis not present

## 2021-07-17 ENCOUNTER — Other Ambulatory Visit: Payer: Self-pay | Admitting: Cardiovascular Disease

## 2021-07-18 NOTE — Telephone Encounter (Signed)
Rx(s) sent to pharmacy electronically.  

## 2021-07-23 DIAGNOSIS — F1210 Cannabis abuse, uncomplicated: Secondary | ICD-10-CM | POA: Diagnosis not present

## 2021-07-23 DIAGNOSIS — E6609 Other obesity due to excess calories: Secondary | ICD-10-CM | POA: Diagnosis not present

## 2021-07-23 DIAGNOSIS — I10 Essential (primary) hypertension: Secondary | ICD-10-CM | POA: Diagnosis not present

## 2021-07-23 DIAGNOSIS — M130 Polyarthritis, unspecified: Secondary | ICD-10-CM | POA: Diagnosis not present

## 2021-07-23 DIAGNOSIS — Z79899 Other long term (current) drug therapy: Secondary | ICD-10-CM | POA: Diagnosis not present

## 2021-07-23 DIAGNOSIS — L409 Psoriasis, unspecified: Secondary | ICD-10-CM | POA: Diagnosis not present

## 2021-07-23 DIAGNOSIS — K5790 Diverticulosis of intestine, part unspecified, without perforation or abscess without bleeding: Secondary | ICD-10-CM | POA: Diagnosis not present

## 2021-07-23 DIAGNOSIS — Z8673 Personal history of transient ischemic attack (TIA), and cerebral infarction without residual deficits: Secondary | ICD-10-CM | POA: Diagnosis not present

## 2021-07-23 DIAGNOSIS — R21 Rash and other nonspecific skin eruption: Secondary | ICD-10-CM | POA: Diagnosis not present

## 2021-07-23 DIAGNOSIS — R062 Wheezing: Secondary | ICD-10-CM | POA: Diagnosis not present

## 2021-07-23 DIAGNOSIS — E785 Hyperlipidemia, unspecified: Secondary | ICD-10-CM | POA: Diagnosis not present

## 2021-07-23 DIAGNOSIS — G8929 Other chronic pain: Secondary | ICD-10-CM | POA: Diagnosis not present

## 2021-07-24 DIAGNOSIS — T887XXA Unspecified adverse effect of drug or medicament, initial encounter: Secondary | ICD-10-CM | POA: Diagnosis not present

## 2021-07-27 DIAGNOSIS — R7401 Elevation of levels of liver transaminase levels: Secondary | ICD-10-CM | POA: Diagnosis not present

## 2021-07-27 DIAGNOSIS — R21 Rash and other nonspecific skin eruption: Secondary | ICD-10-CM | POA: Diagnosis not present

## 2021-07-27 DIAGNOSIS — I10 Essential (primary) hypertension: Secondary | ICD-10-CM | POA: Diagnosis not present

## 2021-07-27 DIAGNOSIS — R062 Wheezing: Secondary | ICD-10-CM | POA: Diagnosis not present

## 2021-07-27 DIAGNOSIS — E785 Hyperlipidemia, unspecified: Secondary | ICD-10-CM | POA: Diagnosis not present

## 2021-07-27 DIAGNOSIS — L409 Psoriasis, unspecified: Secondary | ICD-10-CM | POA: Diagnosis not present

## 2021-08-06 ENCOUNTER — Other Ambulatory Visit: Payer: Self-pay | Admitting: Family Medicine

## 2021-08-06 DIAGNOSIS — R1012 Left upper quadrant pain: Secondary | ICD-10-CM | POA: Diagnosis not present

## 2021-08-06 DIAGNOSIS — L409 Psoriasis, unspecified: Secondary | ICD-10-CM | POA: Diagnosis not present

## 2021-08-06 DIAGNOSIS — I10 Essential (primary) hypertension: Secondary | ICD-10-CM | POA: Diagnosis not present

## 2021-08-06 DIAGNOSIS — R7401 Elevation of levels of liver transaminase levels: Secondary | ICD-10-CM | POA: Diagnosis not present

## 2021-08-06 DIAGNOSIS — R0981 Nasal congestion: Secondary | ICD-10-CM | POA: Diagnosis not present

## 2021-08-06 DIAGNOSIS — R21 Rash and other nonspecific skin eruption: Secondary | ICD-10-CM | POA: Diagnosis not present

## 2021-08-06 DIAGNOSIS — E785 Hyperlipidemia, unspecified: Secondary | ICD-10-CM | POA: Diagnosis not present

## 2021-08-06 DIAGNOSIS — R062 Wheezing: Secondary | ICD-10-CM | POA: Diagnosis not present

## 2021-08-06 DIAGNOSIS — R109 Unspecified abdominal pain: Secondary | ICD-10-CM | POA: Diagnosis not present

## 2021-08-13 ENCOUNTER — Other Ambulatory Visit: Payer: Self-pay | Admitting: Family Medicine

## 2021-08-13 DIAGNOSIS — R1012 Left upper quadrant pain: Secondary | ICD-10-CM

## 2021-08-22 LAB — RESULTS CONSOLE HPV: CHL HPV: NEGATIVE

## 2021-08-22 LAB — HM PAP SMEAR: HM Pap smear: NORMAL

## 2021-08-23 ENCOUNTER — Ambulatory Visit
Admission: RE | Admit: 2021-08-23 | Discharge: 2021-08-23 | Disposition: A | Discharge status: Home / Self Care | Payer: Medicare (Managed Care) | Source: Ambulatory Visit | Attending: Family Medicine | Admitting: Family Medicine

## 2021-08-23 DIAGNOSIS — Z9049 Acquired absence of other specified parts of digestive tract: Secondary | ICD-10-CM | POA: Diagnosis not present

## 2021-08-23 DIAGNOSIS — K7689 Other specified diseases of liver: Secondary | ICD-10-CM | POA: Diagnosis not present

## 2021-08-23 DIAGNOSIS — R1012 Left upper quadrant pain: Secondary | ICD-10-CM

## 2021-10-02 DIAGNOSIS — R7401 Elevation of levels of liver transaminase levels: Secondary | ICD-10-CM | POA: Diagnosis not present

## 2021-10-09 ENCOUNTER — Other Ambulatory Visit: Payer: Self-pay | Admitting: Family Medicine

## 2021-10-09 DIAGNOSIS — R7401 Elevation of levels of liver transaminase levels: Secondary | ICD-10-CM

## 2021-10-14 ENCOUNTER — Other Ambulatory Visit: Payer: Self-pay | Admitting: Cardiovascular Disease

## 2021-10-15 NOTE — Telephone Encounter (Signed)
Please call pt to schedule overdue appointment with Dr. Oval Linsey for refills. Last seen 06/2019.

## 2021-10-17 DIAGNOSIS — Z1389 Encounter for screening for other disorder: Secondary | ICD-10-CM | POA: Diagnosis not present

## 2021-10-17 DIAGNOSIS — Z0000 Encounter for general adult medical examination without abnormal findings: Secondary | ICD-10-CM | POA: Diagnosis not present

## 2021-10-22 ENCOUNTER — Ambulatory Visit
Admission: RE | Admit: 2021-10-22 | Discharge: 2021-10-22 | Disposition: A | Discharge status: Home / Self Care | Payer: Medicare (Managed Care) | Source: Ambulatory Visit | Attending: Family Medicine | Admitting: Family Medicine

## 2021-10-22 DIAGNOSIS — R7401 Elevation of levels of liver transaminase levels: Secondary | ICD-10-CM

## 2021-10-24 DIAGNOSIS — Z79899 Other long term (current) drug therapy: Secondary | ICD-10-CM | POA: Diagnosis not present

## 2021-10-24 DIAGNOSIS — R062 Wheezing: Secondary | ICD-10-CM | POA: Diagnosis not present

## 2021-10-24 DIAGNOSIS — I10 Essential (primary) hypertension: Secondary | ICD-10-CM | POA: Diagnosis not present

## 2021-10-24 DIAGNOSIS — Z6836 Body mass index (BMI) 36.0-36.9, adult: Secondary | ICD-10-CM | POA: Diagnosis not present

## 2021-10-24 DIAGNOSIS — E7800 Pure hypercholesterolemia, unspecified: Secondary | ICD-10-CM | POA: Diagnosis not present

## 2021-10-24 DIAGNOSIS — L4050 Arthropathic psoriasis, unspecified: Secondary | ICD-10-CM | POA: Diagnosis not present

## 2021-10-24 DIAGNOSIS — R0982 Postnasal drip: Secondary | ICD-10-CM | POA: Diagnosis not present

## 2021-10-24 DIAGNOSIS — K7689 Other specified diseases of liver: Secondary | ICD-10-CM | POA: Diagnosis not present

## 2021-10-24 DIAGNOSIS — Z8673 Personal history of transient ischemic attack (TIA), and cerebral infarction without residual deficits: Secondary | ICD-10-CM | POA: Diagnosis not present

## 2021-10-24 DIAGNOSIS — Z9049 Acquired absence of other specified parts of digestive tract: Secondary | ICD-10-CM | POA: Diagnosis not present

## 2021-10-26 DIAGNOSIS — Z1231 Encounter for screening mammogram for malignant neoplasm of breast: Secondary | ICD-10-CM | POA: Diagnosis not present

## 2021-10-29 ENCOUNTER — Encounter (HOSPITAL_BASED_OUTPATIENT_CLINIC_OR_DEPARTMENT_OTHER): Payer: Self-pay | Admitting: Cardiovascular Disease

## 2021-10-29 ENCOUNTER — Ambulatory Visit (INDEPENDENT_AMBULATORY_CARE_PROVIDER_SITE_OTHER): Payer: Medicare (Managed Care) | Admitting: Cardiovascular Disease

## 2021-10-29 DIAGNOSIS — R0609 Other forms of dyspnea: Secondary | ICD-10-CM | POA: Insufficient documentation

## 2021-10-29 DIAGNOSIS — I10 Essential (primary) hypertension: Secondary | ICD-10-CM | POA: Diagnosis not present

## 2021-10-29 DIAGNOSIS — E785 Hyperlipidemia, unspecified: Secondary | ICD-10-CM | POA: Diagnosis not present

## 2021-10-29 DIAGNOSIS — R0789 Other chest pain: Secondary | ICD-10-CM

## 2021-10-29 HISTORY — DX: Other forms of dyspnea: R06.09

## 2021-10-29 MED ORDER — METOPROLOL TARTRATE 25 MG PO TABS: ORAL_TABLET | ORAL | 0 refills | Status: DC

## 2021-10-29 MED ORDER — IVABRADINE HCL 5 MG PO TABS
ORAL_TABLET | ORAL | 0 refills | Status: DC
Start: 2021-10-29 — End: 2021-11-30

## 2021-10-29 NOTE — Assessment & Plan Note (Signed)
Blood pressure is well controlled on amlodipine and valsartan.

## 2021-10-29 NOTE — Patient Instructions (Addendum)
Medication Instructions:  TAKE METOPROLOL 2 HOURS PRIOR TO CT  TAKE 2 IVABRADINE 5 MG  2 HOURS PRIOR TO CT   *If you need a refill on your cardiac medications before your next appointment, please call your pharmacy*  Lab Work: BMET 1 WEEK PRIOR TO CARDIAC CT If you have labs (blood work) drawn today and your tests are completely normal, you will receive your results only by: [place] (if you have MyChart) OR A paper copy in the mail If you have any lab test that is abnormal or we need to change your treatment, we will call you to review the results.  Testing/Procedures: Your physician has requested that you have cardiac CT. Cardiac computed tomography (CT) is a painless test that uses an x-ray machine to take clear, detailed pictures of your heart. For further information please visit [email or web address]. Please follow instruction sheet as given.  THE OFFICE WILL CALL YOU TO SCHEDULED ONCE INSURANCE HAS BEEN REVIEWED   Your physician has requested that you have an echocardiogram. Echocardiography is a painless test that uses sound waves to create images of your heart. It provides your doctor with information about the size and shape of your heart and how well your heart's chambers and valves are working. This procedure takes approximately one hour. There are no restrictions for this procedure.    Follow-Up: At [facility], you and your health needs are our priority.  As part of our continuing mission to provide you with exceptional heart care, we have created designated Provider Care Teams.  These Care Teams include your primary Cardiologist (physician) and Advanced Practice Providers (APPs -  Physician Assistants and Nurse Practitioners) who all work together to provide you with the care you need, when you need it.  We recommend signing up for the patient portal called "MyChart".  Sign up information is provided on this After Visit Summary.  MyChart is used to connect with  patients for Virtual Visits (Telemedicine).  Patients are able to view lab/test results, encounter notes, upcoming appointments, etc.  Non-urgent messages can be sent to your provider as well.   To learn more about what you can do with MyChart, go to [email or web address].    Your next appointment:   1 month(s)  The format for your next appointment:   In Person  Provider:   Laurann Montana, NP   Your physician wants you to follow-up in: [place] will receive a reminder letter in the mail two months in advance. If you don't receive a letter, please call our office to schedule the follow-up appointment.  Other Instructions  Your cardiac CT will be scheduled at one of the below locations:   [facility] [address] [place], [place] [address] [phone]  [place] [address] [place], [place] [address] [phone]  If scheduled at [facility], please arrive at the [facility] and Children's Entrance (Entrance C2) of [facility] 30 minutes prior to test start time. You can use the FREE valet parking offered at entrance C (encouraged to control the heart rate for the test)  Proceed to the [facility] Radiology Department (first floor) to check-in and test prep.  All radiology patients and guests should use entrance C2 at [facility], accessed from [facility], even though the hospital's physical address listed is [address].    If scheduled at [facility], please  arrive 15 mins early for check-in and test prep.  Please follow these instructions carefully (unless otherwise directed  On the Night Before the Test: Be sure to Drink plenty of water. Do not consume any caffeinated/decaffeinated beverages or chocolate 12 hours prior to your test. Do not take any antihistamines 12 hours prior to your test.  On the Day of  the Test: Drink plenty of water until 1 hour prior to the test. Do not eat any food 4 hours prior to the test. You may take your regular medications prior to the test.  Take metoprolol (Lopressor) two hours prior to test. HOLD Furosemide/Hydrochlorothiazide morning of the test. FEMALES- please wear underwire-free bra if available, avoid dresses & tight clothing    After the Test: Drink plenty of water. After receiving IV contrast, you may experience a mild flushed feeling. This is normal. On occasion, you may experience a mild rash up to 24 hours after the test. This is not dangerous. If this occurs, you can take Benadryl 25 mg and increase your fluid intake. If you experience trouble breathing, this can be serious. If it is severe call 911 IMMEDIATELY. If it is mild, please call our office. If you take any of these medications: Glipizide/Metformin, Avandament, Glucavance, please do not take 48 hours after completing test unless otherwise instructed.  We will call to schedule your test 2-4 weeks out understanding that some insurance companies will need an authorization prior to the service being performed.   For non-scheduling related questions, please contact the cardiac imaging nurse navigator should you have any questions/concerns: Marchia Bond, Cardiac Imaging Nurse Navigator Gordy Clement, Cardiac Imaging Nurse Navigator [facility] Direct Office Dial: [phone]   For scheduling needs, including cancellations and rescheduling, please call [place], [phone].  Cardiac CT Angiogram A cardiac CT angiogram is a procedure to look at the heart and the area around the heart. It may be done to help find the cause of chest pains or other symptoms of heart disease. During this procedure, a substance called contrast dye is injected into the blood vessels in the area to be checked. A large X-ray machine, called a CT scanner, then takes detailed pictures of the  heart and the surrounding area. The procedure is also sometimes called a coronary CT angiogram, coronary artery scanning, or CTA. A cardiac CT angiogram allows the health care provider to see how well blood is flowing to and from the heart. The health care provider will be able to see if there are any problems, such as: Blockage or narrowing of the coronary arteries in the heart. Fluid around the heart. Signs of weakness or disease in the muscles, valves, and tissues of the heart. Tell a health care provider about: Any allergies you have. This is especially important if you have had a previous allergic reaction to contrast dye. All medicines you are taking, including vitamins, herbs, eye drops, creams, and over-the-counter medicines. Any blood disorders you have. Any surgeries you have had. Any medical conditions you have. Whether you are pregnant or may be pregnant. Any anxiety disorders, chronic pain, or other conditions you have that may increase your stress or prevent you from lying still. What are the risks? Generally, this is a safe procedure. However, problems may occur, including: Bleeding. Infection. Allergic reactions to medicines or dyes. Damage to other structures or organs. Kidney damage from the contrast dye that is used. Increased risk of cancer from radiation exposure. This risk is low. Talk with  your health care provider about: The risks and benefits of testing. How you can receive the lowest dose of radiation. What happens before the procedure? Wear comfortable clothing and remove any jewelry, glasses, dentures, and hearing aids. Follow instructions from your health care provider about eating and drinking. This may include: For 12 hours before the procedure -- avoid caffeine. This includes tea, coffee, soda, energy drinks, and diet pills. Drink plenty of water or other fluids that do not have caffeine in them. Being well hydrated can prevent complications. For 4-6 hours  before the procedure -- stop eating and drinking. The contrast dye can cause nausea, but this is less likely if your stomach is empty. Ask your health care provider about changing or stopping your regular medicines. This is especially important if you are taking diabetes medicines, blood thinners, or medicines to treat problems with erections (erectile dysfunction). What happens during the procedure?  Hair on your chest may need to be removed so that small sticky patches called electrodes can be placed on your chest. These will transmit information that helps to monitor your heart during the procedure. An IV will be inserted into one of your veins. You might be given a medicine to control your heart rate during the procedure. This will help to ensure that good images are obtained. You will be asked to lie on an exam table. This table will slide in and out of the CT machine during the procedure. Contrast dye will be injected into the IV. You might feel warm, or you may get a metallic taste in your mouth. You will be given a medicine called nitroglycerin. This will relax or dilate the arteries in your heart. The table that you are lying on will move into the CT machine tunnel for the scan. The person running the machine will give you instructions while the scans are being done. You may be asked to: Keep your arms above your head. Hold your breath. Stay very still, even if the table is moving. When the scanning is complete, you will be moved out of the machine. The IV will be removed. The procedure may vary among health care providers and hospitals. What can I expect after the procedure? After your procedure, it is common to have: A metallic taste in your mouth from the contrast dye. A feeling of warmth. A headache from the nitroglycerin. Follow these instructions at home: Take over-the-counter and prescription medicines only as told by your health care provider. If you are told, drink enough  fluid to keep your urine pale yellow. This will help to flush the contrast dye out of your body. Most people can return to their normal activities right after the procedure. Ask your health care provider what activities are safe for you. It is up to you to get the results of your procedure. Ask your health care provider, or the department that is doing the procedure, when your results will be ready. Keep all follow-up visits as told by your health care provider. This is important. Contact a health care provider if: You have any symptoms of allergy to the contrast dye. These include: Shortness of breath. Rash or hives. A racing heartbeat. Summary A cardiac CT angiogram is a procedure to look at the heart and the area around the heart. It may be done to help find the cause of chest pains or other symptoms of heart disease. During this procedure, a large X-ray machine, called a CT scanner, takes detailed pictures of the heart  and the surrounding area after a contrast dye has been injected into blood vessels in the area. Ask your health care provider about changing or stopping your regular medicines before the procedure. This is especially important if you are taking diabetes medicines, blood thinners, or medicines to treat erectile dysfunction. If you are told, drink enough fluid to keep your urine pale yellow. This will help to flush the contrast dye out of your body. This information is not intended to replace advice given to you by your health care provider. Make sure you discuss any questions you have with your health care provider. Document Revised: 06/28/2021 Document Reviewed: 11/04/2018 Elsevier Patient Education  [place].

## 2021-10-29 NOTE — Assessment & Plan Note (Signed)
Ashley Lewis continues to have atypical chest pain.  Her symptoms do not seem ischemic.  However she has been seen multiple times and has been to the emergency department with recurrent symptoms.  At this point, I think the best plan would be to get a coronary CTA to better evaluate and make sure were not missing anything.  This will also settle the issue going forward.  Continue amlodipine.  Resume lipid therapy when able as above.

## 2021-10-29 NOTE — Assessment & Plan Note (Signed)
LDL goal <70.  Her LFTs were elevated.  Her statin is on hold.  She is in the midst of a workup with her PCP for this.  Once they decide what to do next she needs to either resume a different statin or start with a PCSK9 inhibitor.

## 2021-10-29 NOTE — Assessment & Plan Note (Signed)
She reports exertional dyspnea and feels as though she is retaining fluid.  Will get an echo to better evaluate.  She appears to be euvolemic on exam.

## 2021-10-29 NOTE — Progress Notes (Signed)
Cardiology Office Note   Date:  04/29/4268   ID:  Ashley Lewis, DOB 07-Dec-1965, MRN [id number]  PCP:  Faustino Congress, NP  Cardiologist:   Skeet Latch, MD   No chief complaint on file.   History of Present Illness: Ashley Lewis is a 56 y.o. female ith hypertension, prior tobacco abuse, prior stroke, and THC use who presents for follow-up.  She was initially seen 05/2015 for an evaluation of chest pain.  Ms. Verrill reported R sided chest pain and shoulder pain that had been ongoing for months.  The pain was felt to be atypical no ischemia evaluation was performed at that time.  She is physically active by walking her dog regularly and had no chest pain or shortness of breath with that activity.  Her chest pain was thought to be non-ischemic.  She was hypotensive and orthostatic.  Losartan was discontinued.    At her last visit she continued to have chest pain that was clearly not ischemic.  She is not interested in smoking cessation at that time.  Lipids were elevated and recommended increasing her atorvastatin.  She was seen in the ED 11/2020 with chest pain.  Blood pressure was 169/89.  She left without being seen.  She has pain in her L groin that started in November.  She denies any preceding injury.  She wonders if this is related to her heart, as she remembers when her mom had a heart procedure they went into her groin.  She started a medicine for her psoriasis and then started having several adverse symptoms such as wheezing and rash.  Her PCP recently started on Singulair and Xyzal.  She is unsure if this has helped.  She only notes the wheezing when lying down.  She has had some weight gain but denies frank edema.  She had an increase in her LFTs.  Her statin was discontinued.  She has been struggling with pain and numbness in her L back. Imaging showed fatty liver disease which she attributes to a history of alcohol abuse, though she has not had any alcohol or THC in  over a year.  She wonders whether she needs a stress test. She gets short of breath with exertion and broke out into a sweat when bathing her daughter.  It sometimes makes her go into coughing attacks and have to spit out phlegm.     Past Medical History:  Diagnosis Date   Anemia    iron defi   Atypical chest pain 08/05/2019   Depression    sees Dr. Josetta Huddle   Eczema    Exertional dyspnea 10/29/2021   GERD (gastroesophageal reflux disease)    Hyperlipidemia    Hypertension    Psoriasis    Stroke [facility]) 2011   left thalamic infarct    Past Surgical History:  Procedure Laterality Date   CERVICAL CERCLAGE     x 2, in [place] N/A 02/03/2015   Procedure: LAPAROSCOPIC CHOLECYSTECTOMY;  Surgeon: Arta Bruce Kinsinger, MD;  Location: [place] OR;  Service: General;  Laterality: N/A;     Current Outpatient Medications  Medication Sig Dispense Refill   amLODipine (NORVASC) 5 MG tablet Take 5 mg by mouth daily.     aspirin EC 81 MG tablet Take 81 mg by mouth daily.     fluticasone (FLONASE) 50 MCG/ACT nasal spray Place 1 spray into both nostrils as needed.     ivabradine (CORLANOR) 5 MG TABS tablet TAKE 2  TABLETS 2 HOURS PRIOR TO CARDIAC CT 2 tablet 0   levocetirizine (XYZAL) 5 MG tablet Take 1 tablet by mouth daily at 12 noon.     metoprolol tartrate (LOPRESSOR) 25 MG tablet TAKE 1 TABLET 2 HOURS PRIOR TO CT 1 tablet 0   montelukast (SINGULAIR) 10 MG tablet Take 10 mg by mouth daily.     valsartan (DIOVAN) 80 MG tablet Take 80 mg by mouth daily.     No current facility-administered medications for this visit.    Allergies:   Lisinopril    Social History:  The patient  reports that she quit smoking about 11 years ago. Her smoking use included cigarettes. She started smoking about 12 years ago. She smoked an average of .5 packs per day. She has never used smokeless tobacco. She reports current drug use. Drug: Marijuana. She reports that she does not drink alcohol.    Family History:  The patient's family history includes Cancer in her maternal grandmother; Cancer (age of onset: 4) in her mother; Cancer (age of onset: 82) in her father; Heart failure in her maternal grandfather.    ROS:  Please see the history of present illness.   Otherwise, review of systems are positive for none.   All other systems are reviewed and negative.    PHYSICAL EXAM: VS:  BP 114/78 (BP Location: Left Arm, Patient Position: Sitting, Cuff Size: Large)   Pulse 98   Ht '5\' 2"'$  (1.575 m)   Wt 204 lb (92.5 kg)   BMI 37.31 kg/m  , BMI Body mass index is 37.31 kg/m. GENERAL:  Well appearing HEENT: Pupils equal round and reactive, fundi not visualized, oral mucosa unremarkable NECK:  No jugular venous distention, waveform within normal limits, carotid upstroke brisk and symmetric, no bruits, no thyromegaly LUNGS:  Clear to auscultation bilaterally HEART:  RRR.  PMI not displaced or sustained,S1 and S2 within normal limits, no S3, no S4, no clicks, no rubs, no murmurs ABD:  Flat, positive bowel sounds normal in frequency in pitch, no bruits, no rebound, no guarding, no midline pulsatile mass, no hepatomegaly, no splenomegaly EXT:  2 plus pulses throughout, no edema, no cyanosis no clubbing SKIN:  No rashes no nodules NEURO:  Cranial nerves II through XII grossly intact, motor grossly intact throughout PSYCH:  Cognitively intact, oriented to person place and time  EKG:  EKG is ordered today. The ekg ordered today demonstrates sinus rhythm rate 85 bpm.  RBBB. 02/25/17: Sinus rhythm.  Rate 76 bpm.  RBBB.  07/23/19: Sinus rhythm.  Rate 83 pm.  RBBB. 10/29/21: Sinus rhythm.  Rate 98  bpm.  RBBB.    Recent Labs: 12/14/2020: BUN 13; Creatinine, Ser 0.78; Hemoglobin 12.9; Platelets 261; Potassium 3.1; Sodium 139    Lipid Panel    Component Value Date/Time   CHOL 170 08/02/2019 1214   TRIG 85 08/02/2019 1214   HDL 47 08/02/2019 1214   CHOLHDL 3.6 08/02/2019 1214   CHOLHDL 4.3  08/12/2012 1210   VLDL 20 08/12/2012 1210   LDLCALC 107 (H) 08/02/2019 1214   09/03/16: Total cholesterol 173, triglycerides 106, HDL 41, LDL 111    Wt Readings from Last 3 Encounters:  10/29/21 204 lb (92.5 kg)  12/14/20 177 lb (80.3 kg)  07/23/19 187 lb (84.8 kg)      ASSESSMENT AND PLAN:  Essential hypertension, benign Blood pressure is well controlled on amlodipine and valsartan.    Dyslipidemia LDL goal <70.  Her LFTs were elevated.  Her  statin is on hold.  She is in the midst of a workup with her PCP for this.  Once they decide what to do next she needs to either resume a different statin or start with a PCSK9 inhibitor.  Atypical chest pain Ms. Kopke continues to have atypical chest pain.  Her symptoms do not seem ischemic.  However she has been seen multiple times and has been to the emergency department with recurrent symptoms.  At this point, I think the best plan would be to get a coronary CTA to better evaluate and make sure were not missing anything.  This will also settle the issue going forward.  Continue amlodipine.  Resume lipid therapy when able as above.  Exertional dyspnea She reports exertional dyspnea and feels as though she is retaining fluid.  Will get an echo to better evaluate.  She appears to be euvolemic on exam.  Current medicines are reviewed at length with the patient today.  The patient does not have concerns regarding medicines.  The following changes have been made: none  Labs/ tests ordered today include:   Orders Placed This Encounter  Procedures   CT CORONARY MORPH W/CTA COR W/SCORE W/CA W/CM &/OR WO/CM   Basic metabolic panel   EKG 77-OEUM   ECHOCARDIOGRAM COMPLETE     Disposition:   FU with Adrik Khim C. Oval Linsey, MD, [facility] in 1 year.  APP 1-2 months.   This note was written with the assistance of speech recognition software.  Please excuse any transcriptional errors.  Signed, Ketrina Boateng C. Oval Linsey, MD, [facility]  10/29/2021 12:50 PM     [facility]

## 2021-11-05 DIAGNOSIS — R7989 Other specified abnormal findings of blood chemistry: Secondary | ICD-10-CM | POA: Diagnosis not present

## 2021-11-05 DIAGNOSIS — Z86010 Personal history of colonic polyps: Secondary | ICD-10-CM | POA: Diagnosis not present

## 2021-11-05 DIAGNOSIS — K7689 Other specified diseases of liver: Secondary | ICD-10-CM | POA: Diagnosis not present

## 2021-11-05 DIAGNOSIS — R062 Wheezing: Secondary | ICD-10-CM | POA: Diagnosis not present

## 2021-11-05 DIAGNOSIS — L409 Psoriasis, unspecified: Secondary | ICD-10-CM | POA: Diagnosis not present

## 2021-11-05 DIAGNOSIS — Z9049 Acquired absence of other specified parts of digestive tract: Secondary | ICD-10-CM | POA: Diagnosis not present

## 2021-11-05 DIAGNOSIS — R945 Abnormal results of liver function studies: Secondary | ICD-10-CM | POA: Diagnosis not present

## 2021-11-07 ENCOUNTER — Ambulatory Visit (INDEPENDENT_AMBULATORY_CARE_PROVIDER_SITE_OTHER): Payer: Medicare (Managed Care)

## 2021-11-07 DIAGNOSIS — I10 Essential (primary) hypertension: Secondary | ICD-10-CM | POA: Diagnosis not present

## 2021-11-07 DIAGNOSIS — R0609 Other forms of dyspnea: Secondary | ICD-10-CM

## 2021-11-07 DIAGNOSIS — R0789 Other chest pain: Secondary | ICD-10-CM | POA: Diagnosis not present

## 2021-11-07 DIAGNOSIS — E785 Hyperlipidemia, unspecified: Secondary | ICD-10-CM

## 2021-11-07 LAB — ECHOCARDIOGRAM COMPLETE
Area-P 1/2: 3.72 cm2
S' Lateral: 2.23 cm

## 2021-11-15 ENCOUNTER — Telehealth (HOSPITAL_COMMUNITY): Payer: Self-pay | Admitting: *Deleted

## 2021-11-15 ENCOUNTER — Telehealth (HOSPITAL_COMMUNITY): Payer: Self-pay | Admitting: Emergency Medicine

## 2021-11-15 NOTE — Telephone Encounter (Signed)
Reaching out to patient to offer assistance regarding upcoming cardiac imaging study; pt verbalizes understanding of appt date/time, parking situation and where to check in, pre-test NPO status and medications ordered, and verified current allergies; name and call back number provided for further questions should they arise Marchia Bond RN Navigator Cardiac Imaging [facility] [phone] office [phone] cell   Arrival 130 w/c entrance Denies iv issues '25mg'$  metoprolol + '10mg'$  ivabradine

## 2021-11-15 NOTE — Telephone Encounter (Signed)
Attempted to call patient regarding upcoming cardiac CT appointment. °Left message on voicemail with name and callback number ° °Rino Hosea RN Navigator Cardiac Imaging °[facility] °[phone] Office °[phone] Cell ° °

## 2021-11-16 ENCOUNTER — Ambulatory Visit (HOSPITAL_COMMUNITY)
Admission: RE | Admit: 2021-11-16 | Discharge: 2021-11-16 | Disposition: A | Discharge status: Home / Self Care | Payer: Medicare (Managed Care) | Source: Ambulatory Visit | Attending: Cardiovascular Disease | Admitting: Cardiovascular Disease

## 2021-11-16 DIAGNOSIS — E785 Hyperlipidemia, unspecified: Secondary | ICD-10-CM | POA: Diagnosis not present

## 2021-11-16 DIAGNOSIS — R0609 Other forms of dyspnea: Secondary | ICD-10-CM | POA: Insufficient documentation

## 2021-11-16 DIAGNOSIS — R0789 Other chest pain: Secondary | ICD-10-CM | POA: Diagnosis not present

## 2021-11-16 DIAGNOSIS — I10 Essential (primary) hypertension: Secondary | ICD-10-CM | POA: Diagnosis not present

## 2021-11-16 MED ORDER — NITROGLYCERIN 0.4 MG SL SUBL
0.8000 mg | SUBLINGUAL_TABLET | Freq: Once | SUBLINGUAL | Status: AC
  Administered 2021-11-16: 0.8 mg via SUBLINGUAL

## 2021-11-16 MED ORDER — METOPROLOL TARTRATE 5 MG/5ML IV SOLN
INTRAVENOUS | Status: AC
  Filled 2021-11-16: qty 10

## 2021-11-16 MED ORDER — NITROGLYCERIN 0.4 MG SL SUBL
SUBLINGUAL_TABLET | SUBLINGUAL | Status: AC
  Filled 2021-11-16: qty 2

## 2021-11-16 MED ORDER — IOHEXOL 350 MG/ML SOLN
100.0000 mL | Freq: Once | INTRAVENOUS | Status: AC | PRN
  Administered 2021-11-16: 100 mL via INTRAVENOUS

## 2021-11-16 MED ORDER — METOPROLOL TARTRATE 5 MG/5ML IV SOLN
10.0000 mg | Freq: Once | INTRAVENOUS | Status: AC
  Administered 2021-11-16: 10 mg via INTRAVENOUS

## 2021-11-23 ENCOUNTER — Other Ambulatory Visit: Payer: Self-pay | Admitting: Cardiovascular Disease

## 2021-11-29 NOTE — Progress Notes (Signed)
Office Visit    Patient Name: Ashley Lewis Date of Encounter: 11/30/2021  PCP:  Faustino Congress, NP   [place]  Cardiologist:  Skeet Latch, MD   Advanced Practice Provider:  No care team member to display Electrophysiologist:  None   Chief Complaint    Ashley Lewis is a 56 y.o. female presents today for follow up after cardiac CTA.   Past Medical History    Past Medical History:  Diagnosis Date   Anemia    iron defi   Atypical chest pain 08/05/2019   Depression    sees Dr. Josetta Huddle   Eczema    Exertional dyspnea 10/29/2021   GERD (gastroesophageal reflux disease)    Hyperlipidemia    Hypertension    Psoriasis    Stroke [facility]) 2011   left thalamic infarct   Past Surgical History:  Procedure Laterality Date   CERVICAL CERCLAGE     x 2, in 1995, [place] N/A 02/03/2015   Procedure: LAPAROSCOPIC CHOLECYSTECTOMY;  Surgeon: Arta Bruce Kinsinger, MD;  Location: [place] OR;  Service: General;  Laterality: N/A;    Allergies  Allergies  Allergen Reactions   Lisinopril Cough   Skyrizi [Risankizumab] Swelling and Rash    History of Present Illness    Ashley Lewis is a 56 y.o. female with a hx of hypertension, HLD, prior tobacco use, prior CVA, THC last seen 10/29/21.  Initial evaluation 05/2015 for atypical chest pain not recommended for ischemic evaluation. Losartan stopped due to hypotension. Atorvastatin previously increased though had to be held due to elevated LFTs with ultrasound showing fatty liver. Has had recurrent office and ED visits for atypical chest pain.   At last visit 10/29/21 noted exertional dyspnea. Cardiac CTA ordered with calcium score of 0. Atorvastatin on hold due to LFTs with next steps pending PCP evaluation.   She presents today for follow up with her daughter.  Reports no chest pain, pressure, tightness.  She notes no shortness of breath at rest or exertional dyspnea.  She notes hearing  "wheezing "when laying down occasionally but not with exertion.  Her primary care provider has started her on allergy medications.  Encouraged to continue to follow-up with them.  Notes episode of back pain and leg pain that resolved after BM - no recent injury -encouraged to follow-up with PCP.  She has been recommended for liver biopsy but is unsure whether she is going to proceed.  Labs via [facility]: 11/05/21 ALT 38, AST 25 07/2021 LDL 63 (while on Atorvastatin)  EKGs/Labs/Other Studies Reviewed:   The following studies were reviewed today:  Cardiac CTA 11/16/21 IMPRESSION: 1. Coronary calcium score of 0.   2. Normal coronary origin with right dominance.   3. No evidence of CAD.   CAD-RADS 0. No evidence of CAD (0%). Consider non-atherosclerotic causes of chest pain.  Echo 10/2021  1. Left ventricular ejection fraction, by estimation, is 60 to 65%. The  left ventricle has normal function. The left ventricle has no regional  wall motion abnormalities. There is mild left ventricular hypertrophy.  Left ventricular diastolic parameters  are consistent with Grade I diastolic dysfunction (impaired relaxation).   2. Right ventricular systolic function is normal. The right ventricular  size is normal. Tricuspid regurgitation signal is inadequate for assessing  PA pressure.   3. The mitral valve is normal in structure. No evidence of mitral valve  regurgitation.   4. The aortic valve is tricuspid. Aortic valve  regurgitation is not  visualized.   5. The inferior vena cava is normal in size with greater than 50%  respiratory variability, suggesting right atrial pressure of 3 mmHg.    EKG:  EKG is not ordered today.    Recent Labs: 12/14/2020: BUN 13; Creatinine, Ser 0.78; Hemoglobin 12.9; Platelets 261; Potassium 3.1; Sodium 139  Recent Lipid Panel    Component Value Date/Time   CHOL 170 08/02/2019 1214   TRIG 85 08/02/2019 1214   HDL 47 08/02/2019 1214   CHOLHDL 3.6 08/02/2019 1214    CHOLHDL 4.3 08/12/2012 1210   VLDL 20 08/12/2012 1210   LDLCALC 107 (H) 08/02/2019 1214    Home Medications   Current Meds  Medication Sig   amLODipine (NORVASC) 5 MG tablet Take 5 mg by mouth daily.   Evolocumab (REPATHA SURECLICK) 211 MG/ML SOAJ Inject 140 mg into the skin every 14 (fourteen) days.   fluticasone (FLONASE) 50 MCG/ACT nasal spray Place 1 spray into both nostrils as needed.   levocetirizine (XYZAL) 5 MG tablet Take 1 tablet by mouth daily at 12 noon.   montelukast (SINGULAIR) 10 MG tablet Take 10 mg by mouth daily.   valsartan (DIOVAN) 80 MG tablet Take 80 mg by mouth daily.     Review of Systems      All other systems reviewed and are otherwise negative except as noted above.  Physical Exam    VS:  BP 122/77 (BP Location: Left Arm, Patient Position: Sitting, Cuff Size: Large)   Pulse 82   Ht '5\' 2"'$  (1.575 m)   Wt 207 lb (93.9 kg)   SpO2 99%   BMI 37.86 kg/m  , BMI Body mass index is 37.86 kg/m.  Wt Readings from Last 3 Encounters:  11/30/21 207 lb (93.9 kg)  10/29/21 204 lb (92.5 kg)  12/14/20 177 lb (80.3 kg)     GEN: Well nourished, overweight, well developed, in no acute distress. HEENT: normal. Neck: Supple, no JVD, carotid bruits, or masses. Cardiac: RRR, no murmurs, rubs, or gallops. No clubbing, cyanosis, edema.  Radials/PT 2+ and equal bilaterally.  Respiratory:  Respirations regular and unlabored, clear to auscultation bilaterally. GI: Soft, nontender, nondistended. MS: No deformity or atrophy. Skin: Warm and dry, no rash. Neuro:  Strength and sensation are intact. Psych: Normal affect.  Assessment & Plan    Atypical chest pain - No recurrence. Cardiac CTA 10/2021 coronary calcium score of 0.  Reassurance provided.  No indication for further ischemic evaluation.    HTN - BP well controlled. Continue current antihypertensive regimen.    HLD / Transaminitis -undergoing work-up for elevated LFTs and presumed fatty liver by imaging by PCP.   Pending possible liver biopsy.Due to persistently elevated ALT/AST will pursue alternate lipid lowering therapy. Start Repatha '140mg'$  q14 days with FLP after 6 doses.  Hx of CVA - Start Repatha as above.         Disposition: Follow up in 6 month(s) with Skeet Latch, MD or APP.  Signed, Loel Dubonnet, NP 11/30/2021, 1:19 PM [facility]

## 2021-11-30 ENCOUNTER — Telehealth (HOSPITAL_BASED_OUTPATIENT_CLINIC_OR_DEPARTMENT_OTHER): Payer: Self-pay

## 2021-11-30 ENCOUNTER — Ambulatory Visit (INDEPENDENT_AMBULATORY_CARE_PROVIDER_SITE_OTHER): Payer: Medicare (Managed Care) | Admitting: Family

## 2021-11-30 ENCOUNTER — Encounter (HOSPITAL_BASED_OUTPATIENT_CLINIC_OR_DEPARTMENT_OTHER): Payer: Self-pay | Admitting: Family

## 2021-11-30 VITALS — BP 122/77 | HR 82 | Ht 62.0 in | Wt 207.0 lb

## 2021-11-30 DIAGNOSIS — E785 Hyperlipidemia, unspecified: Secondary | ICD-10-CM | POA: Diagnosis not present

## 2021-11-30 DIAGNOSIS — I10 Essential (primary) hypertension: Secondary | ICD-10-CM | POA: Diagnosis not present

## 2021-11-30 DIAGNOSIS — Z8673 Personal history of transient ischemic attack (TIA), and cerebral infarction without residual deficits: Secondary | ICD-10-CM | POA: Diagnosis not present

## 2021-11-30 MED ORDER — REPATHA SURECLICK 140 MG/ML ~~LOC~~ SOAJ: 140.0000 mg | SUBCUTANEOUS | 6 refills | Status: DC

## 2021-11-30 NOTE — Telephone Encounter (Signed)
[place]; Ashley Lewis (Key: [id number]); CLINICAL QUESTIONS ANSWERED, AWAITING DETERMINATION

## 2021-11-30 NOTE — Patient Instructions (Addendum)
Medication Instructions:  Your physician has recommended you make the following change in your medication:   Start: Repatha injection every 14 days- Daphene Jaeger will submit your prior auth to insurance for approval, please do not pick up from the pharmacy until you hear from [place].   *If you need a refill on your cardiac medications before your next appointment, please call your pharmacy*   Lab Work: Please return for Lab work after 6 doses of Repatha for fasting lipid panel and liver function tests. You may come to the...   [facility] (3rd floor) [address], [place], [place] [address]  Open: 8am-Noon and 1pm-4:30pm  Please ring the doorbell on the small table when you exit the elevator and the Lab Tech will come get you  [place] [facility] [address] [place], [place], [place] [address] Open: 8am-1pm, then 2pm-4:30pm   [place]- Please see attached locations sheet stapled to your lab work with address and hours.   If you have labs (blood work) drawn today and your tests are completely normal, you will receive your results only by: [place] (if you have MyChart) OR A paper copy in the mail If you have any lab test that is abnormal or we need to change your treatment, we will call you to review the results.  Follow-Up: At [facility], you and your health needs are our priority.  As part of our continuing mission to provide you with exceptional heart care, we have created designated Provider Care Teams.  These Care Teams include your primary Cardiologist (physician) and Advanced Practice Providers (APPs -  Physician Assistants and Nurse Practitioners) who all work together to provide you with the care you need, when you need it.  We recommend signing up for the patient portal called "MyChart".  Sign up information is provided on this After Visit Summary.  MyChart is used to connect with patients for Virtual Visits  (Telemedicine).  Patients are able to view lab/test results, encounter notes, upcoming appointments, etc.  Non-urgent messages can be sent to your provider as well.   To learn more about what you can do with MyChart, go to [email or web address].    Your next appointment:   6 month(s)  The format for your next appointment:   In Person  Provider:   Skeet Latch, MD or Laurann Montana, NP   Other Instructions Heart Healthy Diet Recommendations: A low-salt diet is recommended. Meats should be grilled, baked, or boiled. Avoid fried foods. Focus on lean protein sources like fish or chicken with vegetables and fruits. The American Heart Association is a Microbiologist!  American Heart Association Diet and Lifeystyle Recommendations   Exercise recommendations: The American Heart Association recommends 150 minutes of moderate intensity exercise weekly. Try 30 minutes of moderate intensity exercise 4-5 times per week. This could include walking, jogging, or swimming.   Important Information About Sugar

## 2021-12-03 NOTE — Telephone Encounter (Signed)
Per cover my meds;   "Message from Plan CaseId:[id number];Status:Approved;Review Type:Prior Auth;Coverage Start Date:10/31/2021;Coverage End Date:11/30/2022;"    Patient notified via voicemail and mychart message.

## 2021-12-07 ENCOUNTER — Other Ambulatory Visit: Payer: Self-pay | Admitting: Cardiovascular Disease

## 2022-03-12 ENCOUNTER — Encounter (HOSPITAL_BASED_OUTPATIENT_CLINIC_OR_DEPARTMENT_OTHER): Payer: Self-pay | Admitting: Family Medicine

## 2022-03-12 ENCOUNTER — Encounter (HOSPITAL_BASED_OUTPATIENT_CLINIC_OR_DEPARTMENT_OTHER): Payer: Self-pay | Admitting: Cardiovascular Disease

## 2022-03-12 ENCOUNTER — Ambulatory Visit (HOSPITAL_BASED_OUTPATIENT_CLINIC_OR_DEPARTMENT_OTHER): Payer: Medicare (Managed Care) | Admitting: Family Medicine

## 2022-03-12 VITALS — BP 162/92 | HR 79 | Temp 97.6°F | Ht 62.0 in | Wt 205.1 lb

## 2022-03-12 DIAGNOSIS — I10 Essential (primary) hypertension: Secondary | ICD-10-CM | POA: Diagnosis not present

## 2022-03-12 DIAGNOSIS — Z1211 Encounter for screening for malignant neoplasm of colon: Secondary | ICD-10-CM

## 2022-03-12 DIAGNOSIS — R319 Hematuria, unspecified: Secondary | ICD-10-CM

## 2022-03-12 DIAGNOSIS — M25552 Pain in left hip: Secondary | ICD-10-CM

## 2022-03-12 DIAGNOSIS — G8929 Other chronic pain: Secondary | ICD-10-CM | POA: Insufficient documentation

## 2022-03-12 DIAGNOSIS — R062 Wheezing: Secondary | ICD-10-CM

## 2022-03-12 DIAGNOSIS — M545 Low back pain, unspecified: Secondary | ICD-10-CM | POA: Diagnosis not present

## 2022-03-12 DIAGNOSIS — E785 Hyperlipidemia, unspecified: Secondary | ICD-10-CM | POA: Diagnosis not present

## 2022-03-12 HISTORY — DX: Hematuria, unspecified: R31.9

## 2022-03-12 NOTE — Progress Notes (Signed)
New Patient Office Visit  Subjective    Patient ID: Ashley Lewis, female    DOB: June 07, 1965  Age: 56 y.o. MRN: [id number]  CC:  Chief Complaint  Patient presents with   New Patient (Initial Visit)    Pt here to establish new care, pt stated she has some concerns     HPI Ashley Lewis presents to establish care Last PCP - [name]  Reports history of psoriasis, was following with dermatology, started on [place]. Reports that she had some reactions to it. Reports that she does not have any upcoming appointments scheduled.  She has some pain over her left hip, anterior aspect. This has been present since November of 2022. No inciting event recalled. She thought pain started after being on a medication; cannot recall the medication. Has been utilizing an OTC supplement "Move Free", some improvement with this.   Reports having some blood in urine on testing with her PCP previously. She thinks she might have had kidney stones, but she's not sure. Some intermittent low back pain. Had imaging completed with prior PCP She also reports having some wheezing at times. Thinks this started after treatment with Skyrizi. She is no longer taking this medication but continues to have intermittent wheezing. Reportedly was treated for possible GERD, allergic rhinitis - no changes with these interventions.  Outpatient Encounter Medications as of 03/12/2022  Medication Sig   amLODipine (NORVASC) 5 MG tablet Take 5 mg by mouth daily.   aspirin EC 81 MG tablet Take 81 mg by mouth daily.   Evolocumab (REPATHA SURECLICK) 545 MG/ML SOAJ Inject 140 mg into the skin every 14 (fourteen) days.   fluticasone (FLONASE) 50 MCG/ACT nasal spray Place 1 spray into both nostrils as needed.   levocetirizine (XYZAL) 5 MG tablet Take 1 tablet by mouth daily at 12 noon.   montelukast (SINGULAIR) 10 MG tablet Take 10 mg by mouth daily.   valsartan (DIOVAN) 80 MG tablet Take 80 mg by mouth daily.   No  facility-administered encounter medications on file as of 03/12/2022.    Past Medical History:  Diagnosis Date   Anemia    iron defi   Atypical chest pain 08/05/2019   Depression    sees Dr. Josetta Huddle   Eczema    Exertional dyspnea 10/29/2021   GERD (gastroesophageal reflux disease)    Hyperlipidemia    Hypertension    Psoriasis    Stroke [facility]) 2011   left thalamic infarct    Past Surgical History:  Procedure Laterality Date   CERVICAL CERCLAGE     x 2, in 1995, 1997   CHOLECYSTECTOMY N/A 02/03/2015   Procedure: LAPAROSCOPIC CHOLECYSTECTOMY;  Surgeon: Arta Bruce Kinsinger, MD;  Location: [place];  Service: General;  Laterality: N/A;    Family History  Problem Relation Age of Onset   Cancer Mother 24       breast cancer x 2   Cancer Father 42       pancreatic   Cancer Maternal Grandmother    Heart failure Maternal Grandfather     Social History   Socioeconomic History   Marital status: Single    Spouse name: Not on file   Number of children: Not on file   Years of education: Not on file   Highest education level: Not on file  Occupational History   Not on file  Tobacco Use   Smoking status: Former    Packs/day: 0.50    Types: Cigarettes  Start date: 03/25/2009    Quit date: 04/14/2010    Years since quitting: 11.9   Smokeless tobacco: Never  Substance and Sexual Activity   Alcohol use: No   Drug use: Yes    Types: Marijuana   Sexual activity: Yes  Other Topics Concern   Not on file  Social History Narrative   Divorced, mother of 4 children, 3 still live with patient. Has 40 yo daughter with severe MR and special needs.    Does not work outside home. Working to try for disability, has lawyers currently.            Epworth Sleepiness Scale = 6 (as of 05/26/2015)   Social Determinants of Health   Financial Resource Strain: Not on file  Food Insecurity: Not on file  Transportation Needs: Not on file  Physical Activity: Not on file  Stress: Not on file   Social Connections: Not on file  Intimate Partner Violence: Not on file    Objective    BP (!) 162/92 (BP Location: Left Arm, Patient Position: Sitting, Cuff Size: Large)   Pulse 79   Temp 97.6 F (36.4 C) (Oral)   Ht '5\' 2"'$  (1.575 m)   Wt 205 lb 1.6 oz (93 kg)   SpO2 97%   BMI 37.51 kg/m   Physical Exam  56 year old female in no acute distress Cardiovascular exam with regular rate and rhythm, no murmur appreciated Lungs clear to auscultation bilaterally Left hip: No significant tenderness about the left hip.  Normal active and passive range of motion for flexion, abduction and adduction.  Negative FABER and FADIR.  Negative logroll.  Assessment & Plan:   Problem List Items Addressed This Visit       Cardiovascular and Mediastinum   Essential hypertension, benign - Primary     Other   Dyslipidemia    Patient is maintained on Repatha, reports that she has been tolerating medication well.  She does follow with cardiology regarding this, recommend regular follow-up with cardiology in regards to management.  No additional concerns today      Hematuria    Reports observing blood in the urine on testing with PCP.  We will proceed with repeat testing here in the office today to assess current status.  Recommendations for additional testing based upon findings from our testing here in the office      Relevant Orders   Urinalysis, Complete (Completed)   Wheezing    Pulmonary exam is normal in the office today.  Given ongoing concerns, can proceed with referral to pulmonology for further evaluation and management      Relevant Orders   Ambulatory referral to Pulmonology   Left low back pain    Chronic, intermittent issue for patient.  Discussed treatment recommendations at this time.  Can proceed with conservative measures, working with physical therapy, home exercise program as per PT.  Can continue with OTC medications or with pain control If symptoms not improving as  expected, consider advanced imaging      Relevant Orders   Ambulatory referral to Physical Therapy   Pain of left hip    Uncertain etiology, exam is reassuring in office today.  Suspect possibly related to hip flexor, less likely osteoarthritis.  Can proceed with initial hip x-rays.  Can also proceed with referral to physical therapy, home exercise program as per PT.  Can continue with OTC medications to help with pain control      Relevant Orders   DG Hip  Unilat W OR W/O Pelvis 2-3 Views Left   Other Visit Diagnoses     Colon cancer screening       Relevant Orders   Ambulatory referral to Gastroenterology       Return in about 4 weeks (around 04/09/2022).   Alroy Portela J [place], MD

## 2022-03-13 LAB — MICROSCOPIC EXAMINATION: Casts: NONE SEEN /lpf

## 2022-03-13 LAB — URINALYSIS, COMPLETE
Bilirubin, UA: NEGATIVE
Glucose, UA: NEGATIVE
Ketones, UA: NEGATIVE
Leukocytes,UA: NEGATIVE
Nitrite, UA: NEGATIVE
Protein,UA: NEGATIVE
RBC, UA: NEGATIVE
Specific Gravity, UA: 1.021 (ref 1.005–1.030)
Urobilinogen, Ur: 0.2 mg/dL (ref 0.2–1.0)
pH, UA: 6 (ref 5.0–7.5)

## 2022-03-15 DIAGNOSIS — R509 Fever, unspecified: Secondary | ICD-10-CM | POA: Diagnosis not present

## 2022-03-15 DIAGNOSIS — M545 Low back pain, unspecified: Secondary | ICD-10-CM | POA: Diagnosis not present

## 2022-03-15 DIAGNOSIS — R300 Dysuria: Secondary | ICD-10-CM | POA: Diagnosis not present

## 2022-03-15 NOTE — Assessment & Plan Note (Signed)
Uncertain etiology, exam is reassuring in office today.  Suspect possibly related to hip flexor, less likely osteoarthritis.  Can proceed with initial hip x-rays.  Can also proceed with referral to physical therapy, home exercise program as per PT.  Can continue with OTC medications to help with pain control

## 2022-03-15 NOTE — Assessment & Plan Note (Signed)
Chronic, intermittent issue for patient.  Discussed treatment recommendations at this time.  Can proceed with conservative measures, working with physical therapy, home exercise program as per PT.  Can continue with OTC medications or with pain control If symptoms not improving as expected, consider advanced imaging

## 2022-03-15 NOTE — Assessment & Plan Note (Signed)
Patient is maintained on Repatha, reports that she has been tolerating medication well.  She does follow with cardiology regarding this, recommend regular follow-up with cardiology in regards to management.  No additional concerns today

## 2022-03-15 NOTE — Assessment & Plan Note (Signed)
Reports observing blood in the urine on testing with PCP.  We will proceed with repeat testing here in the office today to assess current status.  Recommendations for additional testing based upon findings from our testing here in the office

## 2022-03-15 NOTE — Assessment & Plan Note (Signed)
Pulmonary exam is normal in the office today.  Given ongoing concerns, can proceed with referral to pulmonology for further evaluation and management

## 2022-03-16 ENCOUNTER — Encounter (HOSPITAL_BASED_OUTPATIENT_CLINIC_OR_DEPARTMENT_OTHER): Payer: Self-pay | Admitting: Family Medicine

## 2022-03-17 ENCOUNTER — Other Ambulatory Visit: Payer: Self-pay

## 2022-03-17 ENCOUNTER — Encounter (HOSPITAL_COMMUNITY): Payer: Self-pay

## 2022-03-17 ENCOUNTER — Emergency Department (HOSPITAL_COMMUNITY): Payer: Medicare (Managed Care)

## 2022-03-17 ENCOUNTER — Emergency Department (HOSPITAL_COMMUNITY)
Admission: EM | Admit: 2022-03-17 | Discharge: 2022-03-17 | Disposition: A | Discharge status: Home / Self Care | Payer: Medicare (Managed Care) | Attending: Emergency Medicine | Admitting: Emergency Medicine

## 2022-03-17 DIAGNOSIS — J111 Influenza due to unidentified influenza virus with other respiratory manifestations: Secondary | ICD-10-CM

## 2022-03-17 DIAGNOSIS — J101 Influenza due to other identified influenza virus with other respiratory manifestations: Secondary | ICD-10-CM | POA: Insufficient documentation

## 2022-03-17 DIAGNOSIS — Z8673 Personal history of transient ischemic attack (TIA), and cerebral infarction without residual deficits: Secondary | ICD-10-CM | POA: Insufficient documentation

## 2022-03-17 DIAGNOSIS — R519 Headache, unspecified: Secondary | ICD-10-CM | POA: Diagnosis not present

## 2022-03-17 DIAGNOSIS — I10 Essential (primary) hypertension: Secondary | ICD-10-CM | POA: Insufficient documentation

## 2022-03-17 DIAGNOSIS — Z1152 Encounter for screening for COVID-19: Secondary | ICD-10-CM | POA: Insufficient documentation

## 2022-03-17 DIAGNOSIS — R000 Tachycardia, unspecified: Secondary | ICD-10-CM | POA: Diagnosis not present

## 2022-03-17 LAB — BASIC METABOLIC PANEL
Anion gap: 7 (ref 5–15)
BUN: 7 mg/dL (ref 6–20)
CO2: 23 mmol/L (ref 22–32)
Calcium: 8.2 mg/dL — ABNORMAL LOW (ref 8.9–10.3)
Chloride: 104 mmol/L (ref 98–111)
Creatinine, Ser: 0.75 mg/dL (ref 0.44–1.00)
GFR, Estimated: >60 mL/min (ref >60)
Glucose, Bld: 103 mg/dL — ABNORMAL HIGH (ref 70–99)
Potassium: 3.2 mmol/L — ABNORMAL LOW (ref 3.5–5.1)
Sodium: 134 mmol/L — ABNORMAL LOW (ref 135–145)

## 2022-03-17 LAB — CBC WITH DIFFERENTIAL/PLATELET
Abs Immature Granulocytes: 0.01 10*3/uL (ref 0.00–0.07)
Basophils Absolute: 0 10*3/uL (ref 0.0–0.1)
Basophils Relative: 0 %
Eosinophils Absolute: 0 10*3/uL (ref 0.0–0.5)
Eosinophils Relative: 0 %
HCT: 42 % (ref 36.0–46.0)
Hemoglobin: 13.7 g/dL (ref 12.0–15.0)
Immature Granulocytes: 0 %
Lymphocytes Relative: 41 %
Lymphs Abs: 1.7 10*3/uL (ref 0.7–4.0)
MCH: 27.3 pg (ref 26.0–34.0)
MCHC: 32.6 g/dL (ref 30.0–36.0)
MCV: 83.7 fL (ref 80.0–100.0)
Monocytes Absolute: 0.5 10*3/uL (ref 0.1–1.0)
Monocytes Relative: 12 %
Neutro Abs: 2 10*3/uL (ref 1.7–7.7)
Neutrophils Relative %: 47 %
Platelets: 181 10*3/uL (ref 150–400)
RBC: 5.02 MIL/uL (ref 3.87–5.11)
RDW: 13.4 % (ref 11.5–15.5)
WBC: 4.2 10*3/uL (ref 4.0–10.5)
nRBC: 0 % (ref 0.0–0.2)

## 2022-03-17 LAB — URINALYSIS, ROUTINE W REFLEX MICROSCOPIC
Bilirubin Urine: NEGATIVE
Glucose, UA: NEGATIVE mg/dL
Ketones, ur: NEGATIVE mg/dL
Leukocytes,Ua: NEGATIVE
Nitrite: NEGATIVE
Protein, ur: 30 mg/dL — AB
Specific Gravity, Urine: 1.023 (ref 1.005–1.030)
pH: 5 (ref 5.0–8.0)

## 2022-03-17 LAB — RESP PANEL BY RT-PCR (RSV, FLU A&B, COVID)  RVPGX2
Influenza A by PCR: NEGATIVE
Influenza B by PCR: POSITIVE — AB
Resp Syncytial Virus by PCR: NEGATIVE
SARS Coronavirus 2 by RT PCR: NEGATIVE

## 2022-03-17 MED ORDER — IBUPROFEN 400 MG PO TABS
600.0000 mg | ORAL_TABLET | Freq: Once | ORAL | Status: AC
  Administered 2022-03-17: 600 mg via ORAL
  Filled 2022-03-17: qty 1

## 2022-03-17 MED ORDER — ONDANSETRON 4 MG PO TBDP: 4.0000 mg | ORAL_TABLET | Freq: Three times a day (TID) | ORAL | 0 refills | Status: DC | PRN

## 2022-03-17 MED ORDER — ACETAMINOPHEN 500 MG PO TABS
1000.0000 mg | ORAL_TABLET | Freq: Once | ORAL | Status: AC
  Administered 2022-03-17: 1000 mg via ORAL
  Filled 2022-03-17: qty 2

## 2022-03-17 MED ORDER — ONDANSETRON 4 MG PO TBDP
4.0000 mg | ORAL_TABLET | Freq: Once | ORAL | Status: AC
  Administered 2022-03-17: 4 mg via ORAL
  Filled 2022-03-17: qty 1

## 2022-03-17 MED ORDER — POTASSIUM CHLORIDE CRYS ER 20 MEQ PO TBCR
40.0000 meq | EXTENDED_RELEASE_TABLET | Freq: Once | ORAL | Status: AC
  Administered 2022-03-17: 40 meq via ORAL
  Filled 2022-03-17: qty 2

## 2022-03-17 NOTE — ED Provider Notes (Signed)
[facility] EMERGENCY DEPARTMENT Provider Note   CSN: [id number] Arrival date & time: 03/17/22  1607     History  Chief Complaint  Patient presents with   Headache    Ashley Lewis is a 56 y.o. female.   Headache Patient is a 56 year old female with past medical history significant for hypertension, anemia, stroke, reflux  She is present emergency room today with complaints of 4 days of cough congestion fatigue sinus congestion her main complaint is that she has had sinus pressure as well as some ear pressure and some crackling sounds in her ears.  She was seen at urgent care was told to take Benadryl to help with fluid behind her ears.  She felt that she was not being treated completely and came to the emergency room for reevaluation.  She denies any chest pain difficulty breathing.  She states that she did have an episode of nausea earlier but no vomiting.  She has been somewhat nauseous over the past few days     Home Medications Prior to Admission medications   Medication Sig Start Date End Date Taking? Authorizing Provider  ondansetron (ZOFRAN-ODT) 4 MG disintegrating tablet Take 1 tablet (4 mg total) by mouth every 8 (eight) hours as needed for nausea or vomiting. 03/17/22  Yes Ricco Dershem S, PA  amLODipine (NORVASC) 5 MG tablet Take 5 mg by mouth daily. 03/23/18   [provider]  aspirin EC 81 MG tablet Take 81 mg by mouth daily.    [provider]  Evolocumab (REPATHA SURECLICK) 371 MG/ML SOAJ Inject 140 mg into the skin every 14 (fourteen) days. 11/30/21   Loel Dubonnet, NP  fluticasone (FLONASE) 50 MCG/ACT nasal spray Place 1 spray into both nostrils as needed. 10/24/21   [provider]  levocetirizine (XYZAL) 5 MG tablet Take 1 tablet by mouth daily at 12 noon. 10/24/21   [provider]  montelukast (SINGULAIR) 10 MG tablet Take 10 mg by mouth daily. 10/24/21   [provider]  valsartan (DIOVAN)  80 MG tablet Take 80 mg by mouth daily. 08/10/21   [provider]      Allergies    Lisinopril and Skyrizi [risankizumab]    Review of Systems   Review of Systems  Neurological:  Positive for headaches.    Physical Exam Updated Vital Signs BP (!) 145/90   Pulse (!) 50   Temp 98.8 F (37.1 C) (Oral)   Resp 20   Ht '5\' 2"'$  (1.575 m)   Wt 93 kg   SpO2 100%   BMI 37.49 kg/m  Physical Exam Vitals and nursing note reviewed.  Constitutional:      General: She is not in acute distress. HENT:     Head: Normocephalic and atraumatic.     Ears:     Comments: No evidence of otitis media.  Some fluid bubbles behind bilateral TMs.  EACs clear    Nose: Nose normal.  Eyes:     General: No scleral icterus. Cardiovascular:     Rate and Rhythm: Normal rate and regular rhythm.     Pulses: Normal pulses.     Heart sounds: Normal heart sounds.  Pulmonary:     Effort: Pulmonary effort is normal. No respiratory distress.     Breath sounds: No wheezing.  Abdominal:     Palpations: Abdomen is soft.     Tenderness: There is no abdominal tenderness. There is no guarding or rebound.  Musculoskeletal:  Cervical back: Normal range of motion.     Right lower leg: No edema.     Left lower leg: No edema.  Skin:    General: Skin is warm and dry.     Capillary Refill: Capillary refill takes less than 2 seconds.  Neurological:     Mental Status: She is alert. Mental status is at baseline.  Psychiatric:        Mood and Affect: Mood normal.        Behavior: Behavior normal.     ED Results / Procedures / Treatments   Labs (all labs ordered are listed, but only abnormal results are displayed) Labs Reviewed  RESP PANEL BY RT-PCR (RSV, FLU A&B, COVID)  RVPGX2 - Abnormal; Notable for the following components:      Result Value   Influenza B by PCR POSITIVE (*)    All other components within normal limits  BASIC METABOLIC PANEL - Abnormal; Notable for the following components:    Sodium 134 (*)    Potassium 3.2 (*)    Glucose, Bld 103 (*)    Calcium 8.2 (*)    All other components within normal limits  URINALYSIS, ROUTINE W REFLEX MICROSCOPIC - Abnormal; Notable for the following components:   APPearance HAZY (*)    Hgb urine dipstick SMALL (*)    Protein, ur 30 (*)    Bacteria, UA RARE (*)    All other components within normal limits  CBC WITH DIFFERENTIAL/PLATELET    EKG None  Radiology CT Head Wo Contrast  Result Date: 03/17/2022 CLINICAL DATA:  Headache, increasing frequency or severity EXAM: CT HEAD WITHOUT CONTRAST TECHNIQUE: Contiguous axial images were obtained from the base of the skull through the vertex without intravenous contrast. RADIATION DOSE REDUCTION: This exam was performed according to the departmental dose-optimization program which includes automated exposure control, adjustment of the mA and/or kV according to patient size and/or use of iterative reconstruction technique. COMPARISON:  06/15/2009 FINDINGS: Brain: No evidence of acute infarction, hemorrhage, hydrocephalus, extra-axial collection or mass lesion/mass effect. Vascular: No hyperdense vessel or unexpected calcification. Skull: Normal. Negative for fracture or focal lesion. Sinuses/Orbits: No acute finding. IMPRESSION: No acute intracranial process. Electronically Signed   By: Sammie Bench M.D.   On: 03/17/2022 10:41    Procedures Procedures    Medications Ordered in ED Medications  acetaminophen (TYLENOL) tablet 1,000 mg (1,000 mg Oral Given 03/17/22 1002)  ondansetron (ZOFRAN-ODT) disintegrating tablet 4 mg (4 mg Oral Given 03/17/22 1425)  ibuprofen (ADVIL) tablet 600 mg (600 mg Oral Given 03/17/22 1424)  potassium chloride SA (KLOR-CON M) CR tablet 40 mEq (40 mEq Oral Given 03/17/22 1424)    ED Course/ Medical Decision Making/ A&P                           Medical Decision Making Risk Prescription drug management.   Patient is a 56 year old female with past  medical history significant for hypertension, anemia, stroke, reflux  She is present emergency room today with complaints of 4 days of cough congestion fatigue sinus congestion her main complaint is that she has had sinus pressure as well as some ear pressure and some crackling sounds in her ears.  She was seen at urgent care was told to take Benadryl to help with fluid behind her ears.  She felt that she was not being treated completely and came to the emergency room for reevaluation.  She denies any chest pain  difficulty breathing.  She states that she did have an episode of nausea earlier but no vomiting.  She has been somewhat nauseous over the past few days  CBC without leukocytosis or anemia, BMP with mild hypokalemia hyponatremia likely related to dehydration she is tolerating p.o. fluids after 1 dose of Zofran.  Will discharge home with Zofran and recommendations to hydrate at home.  Urinalysis relatively unremarkable does have some protein likely again from dehydration she will need to follow-up with PCP regarding this.  Influenza positive.  She will need to hydrate take Tylenol every 6 hours recommend occasional ibuprofen as needed.  She understands importance of returning to the emergency room should her symptoms worsen.  She is ambulatory and tolerating p.o. and not dyspneic has remained under percent on room air without any difficulty breathing.   Final Clinical Impression(s) / ED Diagnoses Final diagnoses:  Influenza    Rx / DC Orders ED Discharge Orders          Ordered    ondansetron (ZOFRAN-ODT) 4 MG disintegrating tablet  Every 8 hours PRN        03/17/22 1430              Pati Gallo [place], [place] 03/17/22 1451    Pattricia Boss, MD 03/20/22 1708

## 2022-03-17 NOTE — Discharge Instructions (Addendum)
You are diagnosed with influenza, hydrate, take Tylenol and ibuprofen as discussed below.  I recommend taking the Tylenol every 6 hours like clockwork.  You can use ibuprofen in between doses of Tylenol as needed.  The maximum doses are below.  Please use Tylenol or ibuprofen for pain.  You may use 600 mg ibuprofen every 6 hours or 1000 mg of Tylenol every 6 hours.  You may choose to alternate between the 2.  This would be most effective.  Not to exceed 4 g of Tylenol within 24 hours.  Not to exceed 3200 mg ibuprofen 24 hours.   Zofran as needed for nausea, use Flonase which you have at home 2 sprays in each nostril twice daily.  Keep using the Benadryl that you are using.  Return to emergency room for any new or concerning symptoms as discussed

## 2022-03-17 NOTE — ED Provider Triage Note (Signed)
Emergency Medicine Provider Triage Evaluation Note  Ashley Lewis , a 56 y.o. female  was evaluated in triage.  Pt complains of sharp pain that runs from her left-sided head to head left neck and into her left ear that has been ongoing since 5 days.  She has been evaluated by urgent care for her symptoms and at that time had a negative swab for COVID and flu.  Has tried over-the-counter medications for her symptoms.  Denies chest pain, shortness of breath, vision changes.   Review of Systems  Positive:  Negative:   Physical Exam  BP (!) 150/90   Pulse (!) 116   Temp (!) 100.4 F (38 C) (Oral)   Resp 17   Ht '5\' 2"'$  (1.575 m)   Wt 93 kg   SpO2 95%   BMI 37.49 kg/m  Gen:   Awake, no distress   Resp:  Normal effort  MSK:   Moves extremities without difficulty  Other:  No focal neurological deficits.  Negative pronator drift.  Medical Decision Making  Medically screening exam initiated at 9:53 AM.  Appropriate orders placed.  Devory A Deahl was informed that the remainder of the evaluation will be completed by another provider, this initial triage assessment does not replace that evaluation, and the importance of remaining in the ED until their evaluation is complete.     Romani Wilbon A, PA-C 03/17/22 [phone]

## 2022-03-17 NOTE — ED Triage Notes (Signed)
Pt arrived POV from home c/o a sharp pain that runs down from her head down her neck and into her left ear since Thursday.

## 2022-03-19 IMAGING — CR DG LUMBAR SPINE COMPLETE 4+V
5 series · 5 of 5 positions shown · non-contrast
Comparison: CT abdomen pelvis dated 07/24/2018.

CLINICAL DATA: Radiculopathy.

EXAM:
LUMBAR SPINE - COMPLETE 4+ VIEW

[w lumbar spine ap]
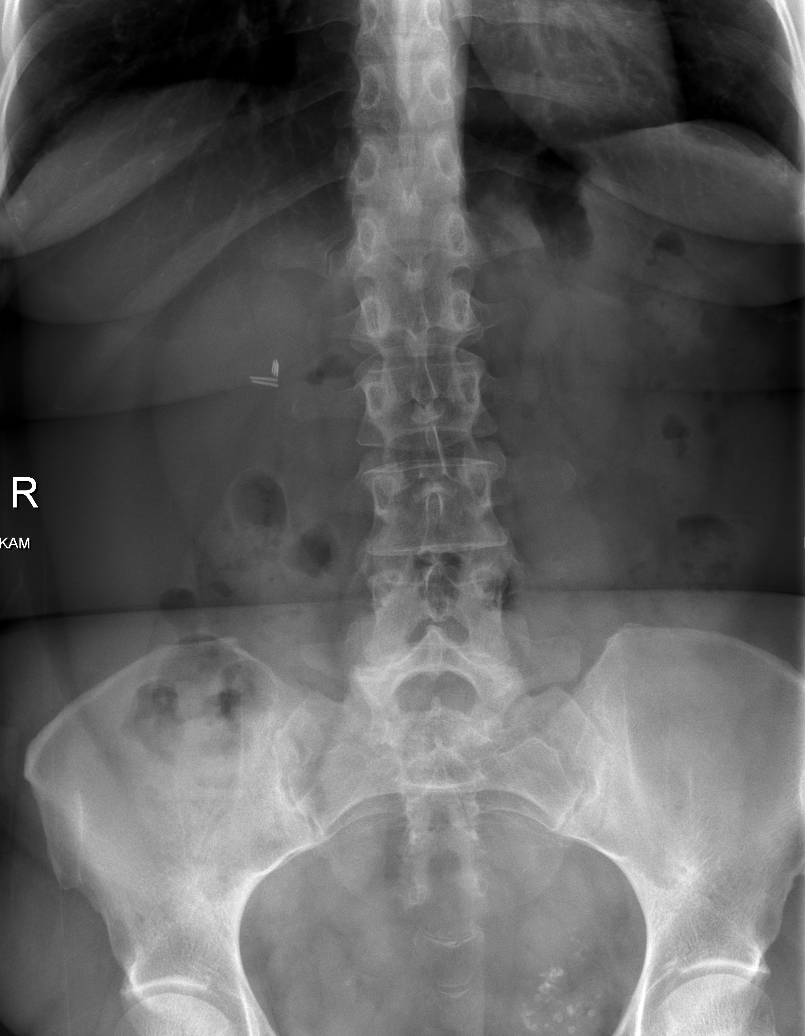

[w lumbar spine obl (1 of 2)]
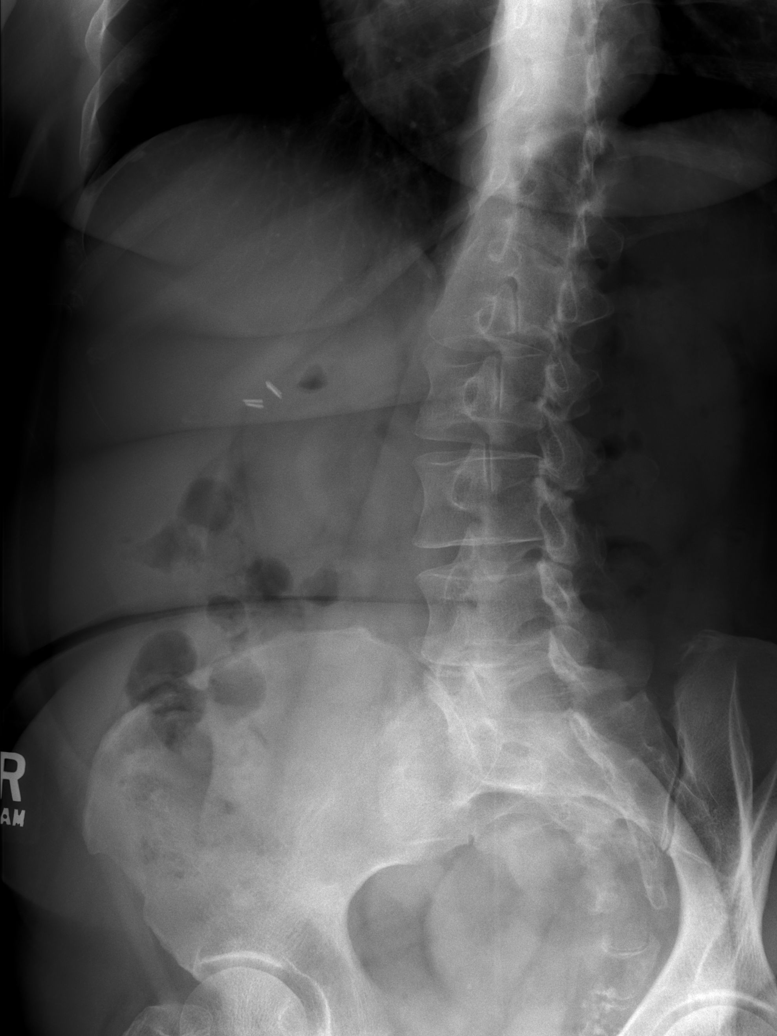

[w lumbar spine obl (2 of 2)]
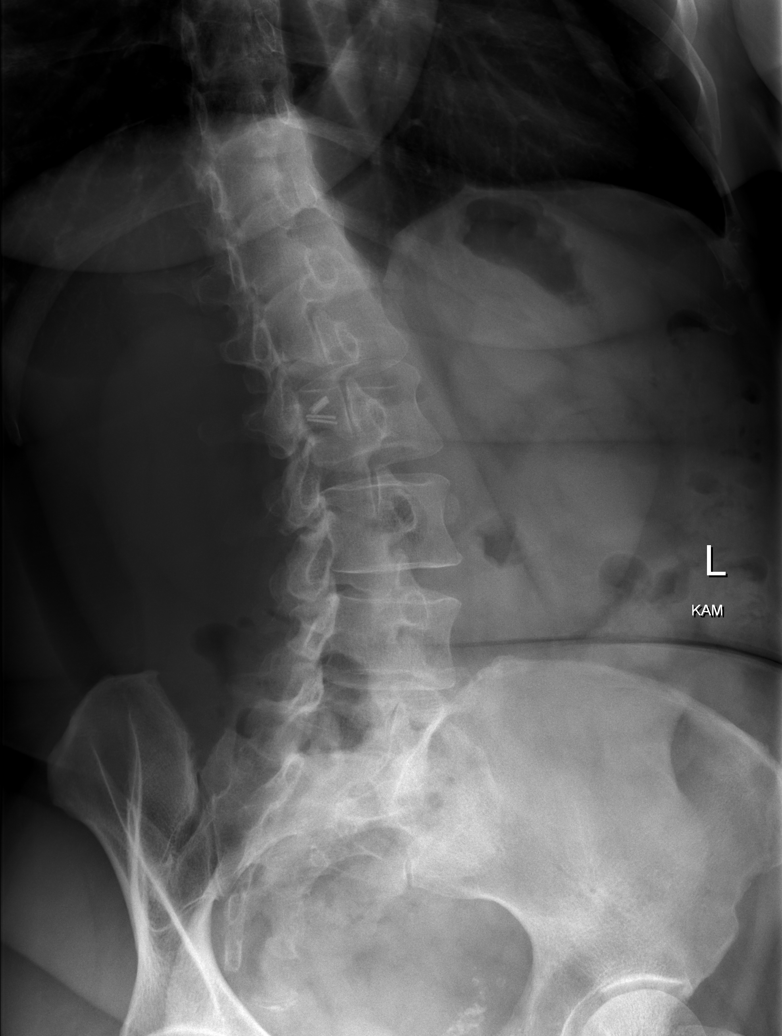

[w lumbar spine lat]
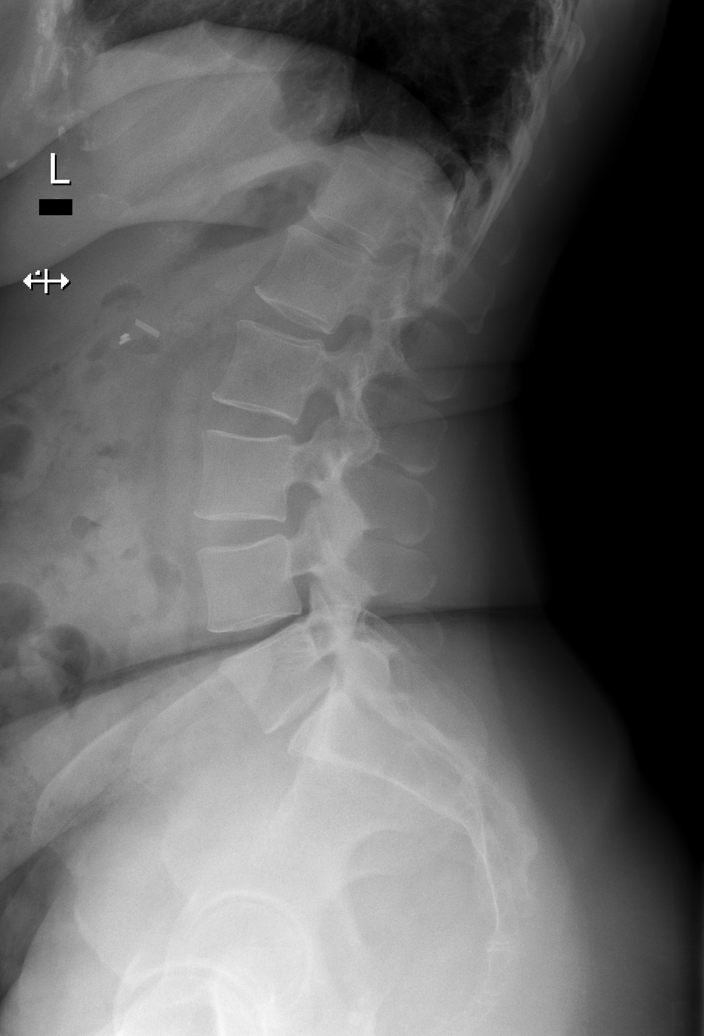

[w lumbar l-5 s-1 spot]
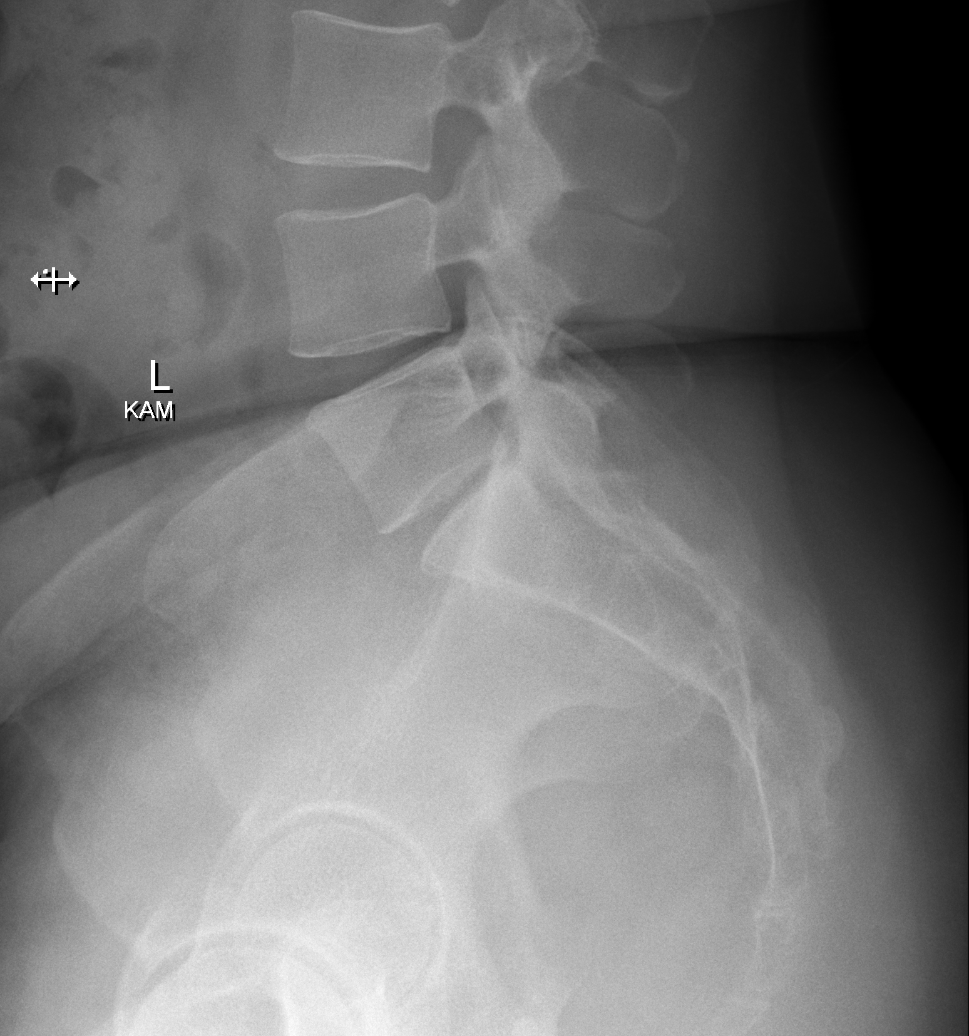

[5 of 5 positions shown; findings below may reference images not displayed]

FINDINGS: No acute fracture or subluxation. The vertebral body heights and
disc spaces are maintained. The visualized posterior elements are
intact. Right upper quadrant cholecystectomy clips. The soft tissues
are unremarkable.
IMPRESSION: Negative.

## 2022-04-08 ENCOUNTER — Ambulatory Visit (INDEPENDENT_AMBULATORY_CARE_PROVIDER_SITE_OTHER): Payer: Medicare HMO

## 2022-04-08 DIAGNOSIS — E785 Hyperlipidemia, unspecified: Secondary | ICD-10-CM | POA: Diagnosis not present

## 2022-04-08 DIAGNOSIS — M25552 Pain in left hip: Secondary | ICD-10-CM | POA: Diagnosis not present

## 2022-04-09 ENCOUNTER — Ambulatory Visit (INDEPENDENT_AMBULATORY_CARE_PROVIDER_SITE_OTHER): Payer: Medicare HMO | Admitting: Family Medicine

## 2022-04-09 ENCOUNTER — Encounter (HOSPITAL_BASED_OUTPATIENT_CLINIC_OR_DEPARTMENT_OTHER): Payer: Self-pay | Admitting: Family Medicine

## 2022-04-09 VITALS — BP 140/90 | HR 103 | Ht 62.0 in | Wt 200.4 lb

## 2022-04-09 DIAGNOSIS — M25552 Pain in left hip: Secondary | ICD-10-CM

## 2022-04-09 DIAGNOSIS — R062 Wheezing: Secondary | ICD-10-CM | POA: Diagnosis not present

## 2022-04-09 DIAGNOSIS — R319 Hematuria, unspecified: Secondary | ICD-10-CM | POA: Diagnosis not present

## 2022-04-09 DIAGNOSIS — E785 Hyperlipidemia, unspecified: Secondary | ICD-10-CM | POA: Diagnosis not present

## 2022-04-09 DIAGNOSIS — I10 Essential (primary) hypertension: Secondary | ICD-10-CM

## 2022-04-09 LAB — HEPATIC FUNCTION PANEL
ALT: 21 IU/L (ref 0–32)
AST: 19 IU/L (ref 0–40)
Albumin: 4.2 g/dL (ref 3.8–4.9)
Alkaline Phosphatase: 67 IU/L (ref 44–121)
Bilirubin Total: 0.3 mg/dL (ref 0.0–1.2)
Bilirubin, Direct: <0.1 mg/dL (ref 0.00–0.40)
Total Protein: 7.8 g/dL (ref 6.0–8.5)

## 2022-04-09 LAB — LIPID PANEL
Chol/HDL Ratio: 3 ratio (ref 0.0–4.4)
Cholesterol, Total: 137 mg/dL (ref 100–199)
HDL: 46 mg/dL (ref >39)
LDL Chol Calc (NIH): 70 mg/dL (ref 0–99)
Triglycerides: 119 mg/dL (ref 0–149)
VLDL Cholesterol Cal: 21 mg/dL (ref 5–40)

## 2022-04-09 NOTE — Assessment & Plan Note (Signed)
Patient was switched to [place] by cardiology.  She has been tolerating this well.  Did have follow-up lipid panel which showed improvement in cholesterol numbers with cholesterol is now at target. Recommend continuing with Repatha and follow-up as scheduled with cardiology

## 2022-04-09 NOTE — Patient Instructions (Signed)
  Medication Instructions:  Your physician recommends that you continue on your current medications as directed. Please refer to the Current Medication list given to you today. --If you need a refill on any your medications before your next appointment, please call your pharmacy first. If no refills are authorized on file call the office.-- Lab Work: Your physician has recommended that you have lab work today: No If you have labs (blood work) drawn today and your tests are completely normal, you will receive your results via [employer] a phone call from our staff.  Please ensure you check your voicemail in the event that you authorized detailed messages to be left on a delegated number. If you have any lab test that is abnormal or we need to change your treatment, we will call you to review the results.  Referrals/Procedures/Imaging: Yes  Follow-Up: Your next appointment:   Your physician recommends that you schedule a follow-up appointment in: 2 months with Dr. de [place].  You will receive a text message or e-mail with a link to a survey about your care and experience with [place] today! We would greatly appreciate your feedback!   Thanks for letting us be apart of your health journey!!  Primary Care and Sports Medicine   Dr. Arlina Robes [place]   We encourage you to activate your patient portal called "MyChart".  Sign up information is provided on this After Visit Summary.  MyChart is used to connect with patients for Virtual Visits (Telemedicine).  Patients are able to view lab/test results, encounter notes, upcoming appointments, etc.  Non-urgent messages can be sent to your provider as well. To learn more about what you can do with MyChart, please visit --  [email or web address].

## 2022-04-09 NOTE — Assessment & Plan Note (Signed)
Patient continues to have intermittent left hip pain.  She did have x-rays completed yesterday.  She does have some increased pain today, relates this to sleeping position.  She has scheduled for initial appointment with physical therapy, this is scheduled for later this month. We did review x-ray imaging results today, these did show normal left hip joint with no acute bony abnormality observed, no joint abnormality observed, normal joint spacing.  Given findings in conjunction with history and prior exam, feel that patient would certainly benefit from physical therapy, recommend continuing with planned PT evaluation later this month If not improving as expected, can consider advanced imaging

## 2022-04-09 NOTE — Assessment & Plan Note (Signed)
Patient previously referred to pulmonology.  It does appear that pulmonology office did attempt to contact patient, however they were not able to get a hold of her.  She continues to have intermittent issues with reported wheezing at night.  She will also have some sinus congestion, increased mucus production, sputum. We reviewed general measures including intranasal steroid spray, intranasal saline spray, oral antihistamine.  These can help with allergic rhinitis, postnasal drip. Also recommend proceeding with evaluation with pulmonology, patient provided with phone number for pulmonology office today so that she may reach out to them to schedule appointment

## 2022-04-09 NOTE — Assessment & Plan Note (Signed)
At last office visit, we did proceed with urinalysis for evaluation of previously observed hematuria.  UA at that time was reassuring with no evidence of hematuria.  She did have more recent UA completed at times when patient had acute illness which did show some proteinuria, however no significant hematuria was observed given these, recommend continuing with routine monitoring at this time

## 2022-04-09 NOTE — Progress Notes (Signed)
    Procedures performed today:    None.  Independent interpretation of notes and tests performed by another provider:   None.  Brief History, Exam, Impression, and Recommendations:    BP (!) 140/90 (BP Location: Left Arm, Patient Position: Sitting, Cuff Size: Large)   Pulse (!) 103   Ht '5\' 2"'$  (1.575 m)   Wt 200 lb 6.4 oz (90.9 kg)   SpO2 99%   BMI 36.65 kg/m   Essential hypertension, benign Patient continues with amlodipine and valsartan, no reported concerns.  No current issues with chest pain or headaches. Blood pressure is borderline in office today, did improve on recheck Recommend intermittent monitoring at home, DASH diet Will continue with current medication regimen, no adjustments to be made today  Hematuria At last office visit, we did proceed with urinalysis for evaluation of previously observed hematuria.  UA at that time was reassuring with no evidence of hematuria.  She did have more recent UA completed at times when patient had acute illness which did show some proteinuria, however no significant hematuria was observed given these, recommend continuing with routine monitoring at this time  Hyperlipidemia Patient was switched to [place] by cardiology.  She has been tolerating this well.  Did have follow-up lipid panel which showed improvement in cholesterol numbers with cholesterol is now at target. Recommend continuing with Repatha and follow-up as scheduled with cardiology  Pain of left hip Patient continues to have intermittent left hip pain.  She did have x-rays completed yesterday.  She does have some increased pain today, relates this to sleeping position.  She has scheduled for initial appointment with physical therapy, this is scheduled for later this month. We did review x-ray imaging results today, these did show normal left hip joint with no acute bony abnormality observed, no joint abnormality observed, normal joint spacing.  Given findings in conjunction  with history and prior exam, feel that patient would certainly benefit from physical therapy, recommend continuing with planned PT evaluation later this month If not improving as expected, can consider advanced imaging  Wheezing Patient previously referred to pulmonology.  It does appear that pulmonology office did attempt to contact patient, however they were not able to get a hold of her.  She continues to have intermittent issues with reported wheezing at night.  She will also have some sinus congestion, increased mucus production, sputum. We reviewed general measures including intranasal steroid spray, intranasal saline spray, oral antihistamine.  These can help with allergic rhinitis, postnasal drip. Also recommend proceeding with evaluation with pulmonology, patient provided with phone number for pulmonology office today so that she may reach out to them to schedule appointment  Patient also provided with phone number for GI office today to be able to arrange for colonoscopy evaluation  Return in about 2 months (around 06/08/2022).   ___________________________________________ Zulema [place], MD, ABFM, CAQSM [facility] [place]

## 2022-04-09 NOTE — Assessment & Plan Note (Signed)
Patient continues with amlodipine and valsartan, no reported concerns.  No current issues with chest pain or headaches. Blood pressure is borderline in office today, did improve on recheck Recommend intermittent monitoring at home, DASH diet Will continue with current medication regimen, no adjustments to be made today

## 2022-04-24 ENCOUNTER — Other Ambulatory Visit: Payer: Self-pay

## 2022-04-24 ENCOUNTER — Encounter (HOSPITAL_BASED_OUTPATIENT_CLINIC_OR_DEPARTMENT_OTHER): Payer: Self-pay | Admitting: Family Medicine

## 2022-04-24 ENCOUNTER — Encounter (HOSPITAL_BASED_OUTPATIENT_CLINIC_OR_DEPARTMENT_OTHER): Payer: Self-pay | Admitting: Physical Therapy

## 2022-04-24 ENCOUNTER — Ambulatory Visit (HOSPITAL_BASED_OUTPATIENT_CLINIC_OR_DEPARTMENT_OTHER): Payer: Medicare HMO | Attending: Family Medicine | Admitting: Physical Therapy

## 2022-04-24 DIAGNOSIS — R262 Difficulty in walking, not elsewhere classified: Secondary | ICD-10-CM | POA: Diagnosis not present

## 2022-04-24 DIAGNOSIS — M545 Low back pain, unspecified: Secondary | ICD-10-CM | POA: Diagnosis not present

## 2022-04-24 DIAGNOSIS — M79605 Pain in left leg: Secondary | ICD-10-CM

## 2022-04-24 DIAGNOSIS — M5459 Other low back pain: Secondary | ICD-10-CM | POA: Diagnosis not present

## 2022-04-24 DIAGNOSIS — M79604 Pain in right leg: Secondary | ICD-10-CM | POA: Diagnosis not present

## 2022-04-24 NOTE — Telephone Encounter (Signed)
Called pt on 1/31 @ 1: 53. Spoke to pt about sending referral to a new office. Pt was sent over to [facility] first she tried to call them to have an appt. Pt is not est and would need to have a NP appt first with them and get the records from [place] sent to them. [facility] let pt know this. Pt wanted to be seen by another office. She gave me a name that her ins. Takes it is a provider at her existing [facility]. She said she's going to do some research and will contact me back regarding where she would like to go. I did let her know if she is wanting to stay with [facility] just with a different provider she did not need this referral anymore since she is already an established pt over there. Pt show understanding and stated she would call back if she needing anything else and when she decides where she would like to go.

## 2022-04-24 NOTE — Therapy (Signed)
OUTPATIENT PHYSICAL THERAPY THORACOLUMBAR EVALUATION   Patient Name: Ashley Lewis MRN: [id number] DOB:1965/08/07, 57 y.o., female Today's Date: 04/24/2022  END OF SESSION:  PT End of Session - 04/24/22 1934     Visit Number 1    Number of Visits 1    Authorization Type [employer] MCR    PT Start Time 6440    PT Stop Time 1145    PT Time Calculation (min) 42 min    Activity Tolerance Treatment limited secondary to medical complications (Comment);Patient limited by pain    Behavior During Therapy Anxious             Past Medical History:  Diagnosis Date   Anemia    iron defi   Atypical chest pain 08/05/2019   Depression    sees Dr. Josetta Huddle   Eczema    Exertional dyspnea 10/29/2021   GERD (gastroesophageal reflux disease)    Hyperlipidemia    Hypertension    Psoriasis    Stroke [facility]) 2011   left thalamic infarct   Past Surgical History:  Procedure Laterality Date   CERVICAL CERCLAGE     x 2, in 1995, [place] N/A 02/03/2015   Procedure: LAPAROSCOPIC CHOLECYSTECTOMY;  Surgeon: Mickeal Skinner, MD;  Location: [facility];  Service: General;  Laterality: N/A;   Patient Active Problem List   Diagnosis Date Noted   Hematuria 03/12/2022   Wheezing 03/12/2022   Left low back pain 03/12/2022   Pain of left hip 03/12/2022   Exertional dyspnea 10/29/2021   Atypical chest pain 08/05/2019   RBBB 04/10/2017   Psoriasis    Hyperlipidemia    GERD (gastroesophageal reflux disease)    Eczema    Depression    Anemia    Biliary colic 34/74/2595   Neck pain on right side 09/02/2012   Left arm pain 09/02/2012   Dyslipidemia 09/02/2012   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 1-2 times per week for 4-6 weeks. Decrease pain, increase strength, flexibility, function, and range of motion. Modalities may include, traction, ionto, phono, and stim. May include dry needling with or without stim.   Rationale for Evaluation and Treatment: Rehabilitation  THERAPY DIAG:  Other low back pain  Difficulty in walking, not elsewhere classified  Pain in left leg  Pain in right leg  ONSET DATE: 2-3 years ago of and on with more recently on than off  SUBJECTIVE:  SUBJECTIVE STATEMENT: Pain that is around abdomen and goes down legs. Not every dya but last 1.5 weeks it has been. I get relief when I go to the restroom. I wear a back brace to walk. I care for my adult daughter 24/7. Cannot sleep on left side due to lateral leg/hip pain. Wheezing that started in May 2023. Last week went a whole week without a BM and was crawling due to pain. Had oxtails the week before and I notice my body does not break down beef well. Ate pizza last night and having the severe pain right now. Denies regular exercise.   PERTINENT HISTORY:  GERD, dyslipidemia cholecystectomy, calcified uterine fibroids seen on imaging and pelvic phleboliths  PAIN:  Are you having pain? Yes: NPRS scale: 8/10 Pain location: abdomen, lower back, bil ant LEs Pain description: shooting, throbbing in back Aggravating factors:   Relieving factors: BM  PRECAUTIONS: None  WEIGHT BEARING RESTRICTIONS: No  FALLS:  Has patient fallen in last 6 months? 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 There is no evidence of arthropathy or other focal bone abnormality. Bilateral pelvic phleboliths, and partially calcified uterine fibroids.   SCREENING FOR RED FLAGS: unremarkable  COGNITION: Overall cognitive status: Within functional limits for tasks assessed     SENSATION: Shooting, N/T into bil LEs   POSTURE: EVAL: standing in a crouched position with incr abdominal pain upon coming upright  PALPATION: EVAL: Patient reported concordant pain upon palpation to superior lateral fibers of glutes max but denied pain upon palpation to bilateral SI joints  LUMBAR ROM:  EVAL: Patient was able to demonstrate an upright posture but resulted in severe abdominal pain at which time patient returned to crouched posture  LOWER EXTREMITY ROM:     Able to demonstrate necessary lower extremity range of motion for seated position at 90/90 as well as standing in full extension  LOWER EXTREMITY MMT:    Demonstrated ability to lift extremities against gravity.  Used upper extremities to assist in standing from table due to abdominal pain   GAIT: Patient ambulated in a crouched position, she was holding her daughter's hand but not for support only due to her daughter wanting to hold her hand TODAY'S TREATMENT:                                                                                                                              DATE: See plan   PATIENT EDUCATION:  Education details: See plan Person educated: Patient Education method: Explanation and Verbal cues Education comprehension: verbalized understanding and needs further education  HOME EXERCISE PROGRAM: Walk TID 15 min ea, daily stool softener, water intake ~  80  oz/day, squatty potty, food diary with bowel movements   ASSESSMENT:  CLINICAL IMPRESSION: Patient is a 57 y.o. F who was seen today for physical therapy evaluation and treatment for diagnosis of low back pain.  On presentation today patient had a very difficult time standing due to abdominal pain.  Discussion and evaluation today brought to light multiple other factors contributing to her pain other than neurological or musculoskeletal inputs.  Distal symptoms were not consistent with radicular dermatomal patterns rather more vague as if referred patterns.  Today we discussed the importance of regular mobility as well as healthy food and water intake for bowel movements.  We also discussed the importance of toileting posture to improve bowel mobility.  Patient reports a history of inconsistent bowel consistency and regularity but does note that a bowel movement will assist in her abdomen feeling better.  I encouraged her to reach out to her gastroenterologist as she reports that she is due for her colonoscopy.  I do think she would also benefit from nutrition consults and she states she is familiar with food diaries because she has done them for her daughter and will begin 1 for herself.  I plan to send her chart to pelvic floor clinic in order to continue addressing musculoskeletal symptoms that will likely still be present after addressing gastroenterology specific symptoms.  Encouraged her to reach out to me with any further questions.  OBJECTIVE IMPAIRMENTS: Abnormal gait, decreased activity tolerance, difficulty walking, increased muscle spasms, improper body mechanics, postural dysfunction, and pain.   ACTIVITY LIMITATIONS: carrying, lifting, bending, sitting, standing, squatting, sleeping, continence, toileting, hygiene/grooming, and caring for others  PARTICIPATION LIMITATIONS: meal prep, cleaning, laundry, driving, shopping, and community activity  PERSONAL FACTORS: 1-2 comorbidities:  Cholecystectomy, chronic nature  are also affecting patient's functional outcome.   REHAB POTENTIAL: Poor poor at this time due to internal contributions to pain, I expect her potential to be good with internal contributions are improved  CLINICAL DECISION MAKING: Unstable/unpredictable  EVALUATION COMPLEXITY: High   GOALS: Goals reviewed with patient? Yes  SHORT TERM GOALS: Target date: 04/24/21  PT will communicate with pelvic floor specialist for transition of care when appropriate Baseline: Goal status: MET  PLAN:  PT FREQUENCY: one time visit  PT DURATION:  1 sessions  PLANNED INTERVENTIONS: Therapeutic exercises, Therapeutic activity, Neuromuscular re-education, Balance training, Gait training, Patient/Family education, Self Care, Joint mobilization, and Re-evaluation.   Ifrah Vest C. Tamaira Ciriello PT, DPT 04/24/22 7:48 PM

## 2022-05-03 ENCOUNTER — Encounter (HOSPITAL_BASED_OUTPATIENT_CLINIC_OR_DEPARTMENT_OTHER): Payer: Self-pay

## 2022-05-15 DIAGNOSIS — K219 Gastro-esophageal reflux disease without esophagitis: Secondary | ICD-10-CM | POA: Diagnosis not present

## 2022-05-15 DIAGNOSIS — M549 Dorsalgia, unspecified: Secondary | ICD-10-CM | POA: Diagnosis not present

## 2022-05-15 DIAGNOSIS — Z1211 Encounter for screening for malignant neoplasm of colon: Secondary | ICD-10-CM | POA: Diagnosis not present

## 2022-05-15 DIAGNOSIS — R1032 Left lower quadrant pain: Secondary | ICD-10-CM | POA: Diagnosis not present

## 2022-05-28 ENCOUNTER — Telehealth: Payer: Self-pay | Admitting: Family Medicine

## 2022-05-28 NOTE — Telephone Encounter (Signed)
Called patient to schedule Medicare Annual Wellness Visit (AWV). Left message for patient to call back and schedule Medicare Annual Wellness Visit (AWV).  Last date of AWV:  08/24/2014  Please schedule an appointment at any time with [place].  If any questions, please contact me at [phone].    Thank you,  [place] Direct dial  [phone]

## 2022-05-30 ENCOUNTER — Telehealth (HOSPITAL_BASED_OUTPATIENT_CLINIC_OR_DEPARTMENT_OTHER): Payer: Self-pay

## 2022-05-30 NOTE — Telephone Encounter (Signed)
Patient already has a scheduled appointment with Dr. Oval Linsey on 3/19. I will update appointment notes to reflect pre-op clearance.

## 2022-05-30 NOTE — Telephone Encounter (Signed)
   Pre-operative Risk Assessment    Patient Name: Ashley Lewis  DOB: 08-01-1965 MRN: [id number]      Request for Surgical Clearance    Procedure:   Colonoscopy  Date of Surgery:  Clearance 06/13/22                                 Surgeon:  Carol Ada, MD Surgeon's Group or Practice Name:  [facility] Phone number:  [phone] Fax number:  [phone]   Type of Clearance Requested:   - Medical    Type of Anesthesia:   Propofol   Additional requests/questions:   None  Barbaraann Faster   05/30/2022, 3:08 PM

## 2022-06-05 ENCOUNTER — Encounter (HOSPITAL_BASED_OUTPATIENT_CLINIC_OR_DEPARTMENT_OTHER): Payer: Self-pay

## 2022-06-11 ENCOUNTER — Other Ambulatory Visit (HOSPITAL_BASED_OUTPATIENT_CLINIC_OR_DEPARTMENT_OTHER): Payer: Self-pay | Admitting: Family

## 2022-06-11 ENCOUNTER — Encounter (HOSPITAL_BASED_OUTPATIENT_CLINIC_OR_DEPARTMENT_OTHER): Payer: Self-pay | Admitting: Cardiovascular Disease

## 2022-06-11 ENCOUNTER — Ambulatory Visit (HOSPITAL_BASED_OUTPATIENT_CLINIC_OR_DEPARTMENT_OTHER): Payer: Medicare HMO | Admitting: Cardiovascular Disease

## 2022-06-11 VITALS — BP 122/74 | HR 88 | Ht 62.0 in | Wt 201.1 lb

## 2022-06-11 DIAGNOSIS — I10 Essential (primary) hypertension: Secondary | ICD-10-CM | POA: Diagnosis not present

## 2022-06-11 DIAGNOSIS — E785 Hyperlipidemia, unspecified: Secondary | ICD-10-CM

## 2022-06-11 DIAGNOSIS — R300 Dysuria: Secondary | ICD-10-CM | POA: Diagnosis not present

## 2022-06-11 LAB — URINALYSIS
Bilirubin, UA: NEGATIVE
Glucose, UA: NEGATIVE
Ketones, UA: NEGATIVE
Leukocytes,UA: NEGATIVE
Nitrite, UA: NEGATIVE
Protein,UA: NEGATIVE
RBC, UA: NEGATIVE
Specific Gravity, UA: 1.022 (ref 1.005–1.030)
Urobilinogen, Ur: 0.2 mg/dL (ref 0.2–1.0)
pH, UA: 5.5 (ref 5.0–7.5)

## 2022-06-11 NOTE — Telephone Encounter (Signed)
Rx request sent to pharmacy.  

## 2022-06-11 NOTE — Progress Notes (Signed)
Cardiology Office Note   Date:  [id number]   ID:  Ashley, Lewis 1965/05/27, MRN [id number]  PCP:  de [place], Raymond J, MD  Cardiologist:   Skeet Latch, MD   No chief complaint on file.   History of Present Illness: Ashley Lewis is a 57 y.o. female ith hypertension, prior tobacco abuse, prior stroke, and THC use who presents for follow-up.  She was initially seen 05/2015 for an evaluation of chest pain.  Ms. Henckel reported R sided chest pain and shoulder pain that had been ongoing for months.  The pain was felt to be atypical no ischemia evaluation was performed at that time.  She is physically active by walking her dog regularly and had no chest pain or shortness of breath with that activity.  Her chest pain was thought to be non-ischemic.  She was hypotensive and orthostatic.  Losartan was discontinued.    At her visit 4/21 she continued to have chest pain that was not ischemic.  She is not interested in smoking cessation at that time.  Lipids were elevated and recommended increasing her atorvastatin.  She was seen in the ED 11/2020 with chest pain.  Blood pressure was 169/89.  She left without being seen.  At her visit 8/23 she continued to repert exertional dispnea and atypical chest pain. She had a Coronary CTA  with a calcium score 0.  Seen by Laurann Montana NP 9/23 and was doing well.   Today, she has been doing ok. Thursday she is going for a colonoscopy and and endoscopy. She was wondering which medications she may need to hold. She is still having some issues. Yesterday she felt she may have a UTI, but home strips showed no blood in the urine. Her blood pressure in office today was good. She has been thinking about starting to walk now that the weather is starting to warm. She is no longer smoking or drinking. Her medications seem to be doing well and she is tolerating them with no issues. She still has the cysts on her liver, but she elected not to have the  biopsy at this point seem the cyst has been there since 2016 and seems stable. The wheezing is still occurring when she lays down at night.   She denies any palpitations, chest pain, shortness of breath, or peripheral edema. No lightheadedness, headaches, syncope, orthopnea, or PND.     Past Medical History:  Diagnosis Date   Anemia    iron defi   Atypical chest pain 08/05/2019   Depression    sees Dr. Josetta Huddle   Eczema    Exertional dyspnea 10/29/2021   GERD (gastroesophageal reflux disease)    Hyperlipidemia    Hypertension    Psoriasis    Stroke [facility]) 2011   left thalamic infarct    Past Surgical History:  Procedure Laterality Date   CERVICAL CERCLAGE     x 2, in [place] N/A 02/03/2015   Procedure: LAPAROSCOPIC CHOLECYSTECTOMY;  Surgeon: Arta Bruce Kinsinger, MD;  Location: [facility] OR;  Service: General;  Laterality: N/A;     Current Outpatient Medications  Medication Sig Dispense Refill   amLODipine (NORVASC) 5 MG tablet Take 5 mg by mouth daily.     aspirin EC 81 MG tablet Take 81 mg by mouth daily.     Evolocumab (REPATHA SURECLICK) [id number] MG/ML SOAJ INJECT 140 MG INTO THE SKIN EVERY 14 (FOURTEEN) DAYS. 6 mL 2   fluticasone (FLONASE)  50 MCG/ACT nasal spray Place 1 spray into both nostrils as needed.     valsartan (DIOVAN) 80 MG tablet Take 80 mg by mouth daily.     No current facility-administered medications for this visit.    Allergies:   Lisinopril and Skyrizi [risankizumab]    Social History:  The patient  reports that she quit smoking about 12 years ago. Her smoking use included cigarettes. She started smoking about 13 years ago. She smoked an average of .5 packs per day. She has never used smokeless tobacco. She reports current drug use. Drug: Marijuana. She reports that she does not drink alcohol.   Family History:  The patient's family history includes Cancer in her maternal grandmother; Cancer (age of onset: 58) in her mother; Cancer (age of  onset: 72) in her father; Heart failure in her maternal grandfather.    ROS:  Please see the history of present illness.   (+) dysuria  All other systems are reviewed and negative.    PHYSICAL EXAM: VS:  BP 122/74 (BP Location: Left Arm, Patient Position: Sitting, Cuff Size: Large)   Pulse 88   Ht 5\' 2"  (1.575 m)   Wt 201 lb 1.6 oz (91.2 kg)   SpO2 94%   BMI 36.78 kg/m  , BMI Body mass index is 36.78 kg/m. GENERAL:  Well appearing HEENT: Pupils equal round and reactive, fundi not visualized, oral mucosa unremarkable NECK:  No jugular venous distention, waveform within normal limits, carotid upstroke brisk and symmetric, no bruits, no thyromegaly LUNGS:  Clear to auscultation bilaterally HEART:  RRR.  PMI not displaced or sustained,S1 and S2 within normal limits, no S3, no S4, no clicks, no rubs, no murmurs ABD:  Flat, positive bowel sounds normal in frequency in pitch, no bruits, no rebound, no guarding, no midline pulsatile mass, no hepatomegaly, no splenomegaly EXT:  2 plus pulses throughout, no edema, no cyanosis no clubbing SKIN:  No rashes no nodules NEURO:  Cranial nerves II through XII grossly intact, motor grossly intact throughout PSYCH:  Cognitively intact, oriented to person place and time  EKG:  EKG is personally reviewed.  06/11/22: EKG was not ordered.  10/29/21: Sinus rhythm. Rate 85 bpm.  RBBB. 02/25/17: Sinus rhythm.  Rate 76 bpm.  RBBB.  07/23/19: Sinus rhythm.  Rate 83 pm.  RBBB. 10/29/21: Sinus rhythm.  Rate 98  bpm.  RBBB.    Recent Labs: 03/17/2022: BUN 7; Creatinine, Ser 0.75; Hemoglobin 13.7; Platelets 181; Potassium 3.2; Sodium 134 04/08/2022: ALT 21    Lipid Panel    Component Value Date/Time   CHOL 137 04/08/2022 1106   TRIG 119 04/08/2022 1106   HDL 46 04/08/2022 1106   CHOLHDL 3.0 04/08/2022 1106   CHOLHDL 4.3 08/12/2012 1210   VLDL 20 08/12/2012 1210   LDLCALC 70 04/08/2022 1106   09/03/16: Total cholesterol 173, triglycerides 106, HDL 41, LDL  111    Wt Readings from Last 3 Encounters:  06/11/22 201 lb 1.6 oz (91.2 kg)  04/09/22 200 lb 6.4 oz (90.9 kg)  03/17/22 205 lb (93 kg)      ASSESSMENT AND PLAN:  No problem-specific Assessment & Plan notes found for this encounter.  # Hyperlipidemia: Assessment: Patient is on Repatha with good cholesterol control and no plaque in heart arteries. Plan: Continue Repatha as prescribed. Monitor cholesterol levels periodically.  # Hypertension: Assessment: Blood pressure is well-controlled on Amlodipine and Valsartan. Plan: Continue current medications. Monitor blood pressure regularly.  # Dysuria: Check U/A  #  Colonoscopy and Endoscopy Preparation: Plan: Hold Valsartan on the day of the procedure. Check blood pressure in the morning; if systolic blood pressure is over 130, take Amlodipine. Continue Repatha and aspirin as prescribed.  # Prior CVA:  Continue aspirin and Repatha.  Lipids well-controlled.   Preventive Care: Plan: Encourage regular exercise, such as walking, and maintaining a healthy lifestyle. Continue to monitor weight and overall health.  Follow up in one year or as needed. Current medicines are reviewed at length with the patient today.  The patient does not have concerns regarding medicines.  The following changes have been made: none  Labs/ tests ordered today include:   Orders Placed This Encounter  Procedures   Urinalysis    Disposition:   FU with Nkosi Cortright C. Oval Linsey, MD, [facility] in 1 year months.   This note was written with the assistance of speech recognition software.  Please excuse any transcriptional errors.   I,Jessica Ford,acting as a Education administrator for [place], MD.,have documented all relevant documentation on the behalf of Skeet Latch, MD,as directed by  Skeet Latch, MD while in the presence of Skeet Latch, MD.   I, [place] Oval Linsey, MD have reviewed all documentation for this visit.  The documentation of the exam,  diagnosis, procedures, and orders on 06/11/2022 are all accurate and complete.   Signed, Kennette Cuthrell C. Oval Linsey, MD, [facility]  06/11/2022 11:58 AM    [place]

## 2022-06-11 NOTE — Patient Instructions (Addendum)
Medication Instructions:  HOLD YOUR VALSARTAN MORNING OF PROCEDURE IF YOUR BLOOD PRESSURE IS ABOVE 130 DAY OF TAKE YOUR AMLODIPINE, IF NOT HOLD AMLODIPINE   *If you need a refill on your cardiac medications before your next appointment, please call your pharmacy*  Lab Work: [place]   If you have labs (blood work) drawn today and your tests are completely normal, you will receive your results only by: MyChart Message (if you have MyChart) OR A paper copy in the mail If you have any lab test that is abnormal or we need to change your treatment, we will call you to review the results.  Testing/Procedures: NONE  Follow-Up: At [facility], you and your health needs are our priority.  As part of our continuing mission to provide you with exceptional heart care, we have created designated Provider Care Teams.  These Care Teams include your primary Cardiologist (physician) and Advanced Practice Providers (APPs -  Physician Assistants and Nurse Practitioners) who all work together to provide you with the care you need, when you need it.  We recommend signing up for the patient portal called "MyChart".  Sign up information is provided on this After Visit Summary.  MyChart is used to connect with patients for Virtual Visits (Telemedicine).  Patients are able to view lab/test results, encounter notes, upcoming appointments, etc.  Non-urgent messages can be sent to your provider as well.   To learn more about what you can do with MyChart, go to [email or web address].    Your next appointment:   12 month(s)  Provider:   Skeet Latch, MD

## 2022-06-13 ENCOUNTER — Other Ambulatory Visit (HOSPITAL_COMMUNITY): Payer: Self-pay | Admitting: Gastroenterology

## 2022-06-13 ENCOUNTER — Ambulatory Visit (HOSPITAL_BASED_OUTPATIENT_CLINIC_OR_DEPARTMENT_OTHER): Payer: Medicare HMO | Admitting: Family Medicine

## 2022-06-13 DIAGNOSIS — R12 Heartburn: Secondary | ICD-10-CM | POA: Diagnosis not present

## 2022-06-13 DIAGNOSIS — R1032 Left lower quadrant pain: Secondary | ICD-10-CM | POA: Diagnosis not present

## 2022-06-13 DIAGNOSIS — K2100 Gastro-esophageal reflux disease with esophagitis, without bleeding: Secondary | ICD-10-CM | POA: Diagnosis not present

## 2022-06-13 DIAGNOSIS — K5730 Diverticulosis of large intestine without perforation or abscess without bleeding: Secondary | ICD-10-CM | POA: Diagnosis not present

## 2022-06-19 ENCOUNTER — Ambulatory Visit (HOSPITAL_BASED_OUTPATIENT_CLINIC_OR_DEPARTMENT_OTHER): Payer: Medicare HMO | Admitting: Family Medicine

## 2022-06-19 ENCOUNTER — Encounter (HOSPITAL_BASED_OUTPATIENT_CLINIC_OR_DEPARTMENT_OTHER): Payer: Self-pay | Admitting: Family Medicine

## 2022-06-21 ENCOUNTER — Encounter (HOSPITAL_BASED_OUTPATIENT_CLINIC_OR_DEPARTMENT_OTHER): Payer: Self-pay | Admitting: Cardiovascular Disease

## 2022-06-24 ENCOUNTER — Telehealth: Payer: Self-pay

## 2022-06-24 ENCOUNTER — Telehealth: Payer: Self-pay | Admitting: Pharmacist Clinician (PhC)/ Clinical Pharmacy Specialist

## 2022-06-24 ENCOUNTER — Ambulatory Visit (HOSPITAL_BASED_OUTPATIENT_CLINIC_OR_DEPARTMENT_OTHER)
Admission: RE | Admit: 2022-06-24 | Discharge: 2022-06-24 | Disposition: A | Discharge status: Home / Self Care | Payer: Medicare HMO | Source: Ambulatory Visit | Attending: Gastroenterology | Admitting: Gastroenterology

## 2022-06-24 ENCOUNTER — Other Ambulatory Visit (HOSPITAL_COMMUNITY): Payer: Self-pay

## 2022-06-24 DIAGNOSIS — R1032 Left lower quadrant pain: Secondary | ICD-10-CM | POA: Insufficient documentation

## 2022-06-24 DIAGNOSIS — R109 Unspecified abdominal pain: Secondary | ICD-10-CM | POA: Diagnosis not present

## 2022-06-24 LAB — POCT I-STAT CREATININE: Creatinine, Ser: 0.6 mg/dL (ref 0.44–1.00)

## 2022-06-24 MED ORDER — IOHEXOL 350 MG/ML SOLN
100.0000 mL | Freq: Once | INTRAVENOUS | Status: AC | PRN
  Administered 2022-06-24: 80 mL via INTRAVENOUS

## 2022-06-24 NOTE — Telephone Encounter (Signed)
Needs PA for Repatha - hx of stroke

## 2022-06-24 NOTE — Telephone Encounter (Signed)
Would we like to see if we can get praulent covered?

## 2022-06-24 NOTE — Telephone Encounter (Signed)
PA approved to 03/25/23 

## 2022-06-24 NOTE — Telephone Encounter (Signed)
Pharmacy Patient Advocate Encounter   Received notification from [place] that prior authorization for REPATHA is needed.    PA submitted on 06/24/22 [facility] Status is pending  Karie Soda, [place] Patient Advocate Specialist Direct Number: [phone] Fax: [phone]

## 2022-06-24 NOTE — Telephone Encounter (Signed)
Pharmacy Patient Advocate Encounter  Prior Authorization for REPATHA has been approved.    Effective dates: 03/25/22 through 03/25/23  Karie Soda, [place] Patient Advocate Specialist Direct Number: [phone] Fax: [phone]

## 2022-06-27 ENCOUNTER — Telehealth (HOSPITAL_BASED_OUTPATIENT_CLINIC_OR_DEPARTMENT_OTHER): Payer: Self-pay | Admitting: Family Medicine

## 2022-06-27 NOTE — Telephone Encounter (Signed)
Called patient to schedule Medicare Annual Wellness Visit (AWV). Left message for patient to call back and schedule Medicare Annual Wellness Visit (AWV).  Last date of AWV: AWVI eligible as of 08/24/2014   Please schedule an AWVI appointment at any time with [place].  If any questions, please contact me at [phone].    Thank you,  [place] Direct dial  [phone]

## 2022-06-29 DIAGNOSIS — J069 Acute upper respiratory infection, unspecified: Secondary | ICD-10-CM | POA: Diagnosis not present

## 2022-06-29 DIAGNOSIS — R000 Tachycardia, unspecified: Secondary | ICD-10-CM | POA: Diagnosis not present

## 2022-07-01 ENCOUNTER — Ambulatory Visit: Payer: Medicare HMO | Admitting: Dermatology

## 2022-07-01 VITALS — BP 134/85 | HR 83

## 2022-07-01 DIAGNOSIS — Z79899 Other long term (current) drug therapy: Secondary | ICD-10-CM | POA: Diagnosis not present

## 2022-07-01 DIAGNOSIS — L409 Psoriasis, unspecified: Secondary | ICD-10-CM | POA: Diagnosis not present

## 2022-07-01 DIAGNOSIS — Z7189 Other specified counseling: Secondary | ICD-10-CM | POA: Diagnosis not present

## 2022-07-01 NOTE — Patient Instructions (Addendum)
Psoriasis is a chronic non-curable, but treatable genetic/hereditary disease that may have other systemic features affecting other organ systems such as joints (Psoriatic Arthritis). It is associated with an increased risk of inflammatory bowel disease, heart disease, non-alcoholic fatty liver disease, and depression.  Treatments include light and laser treatments; topical medications; and systemic medications including oral and injectables.        Due to recent changes in healthcare laws, you may see results of your pathology and/or laboratory studies on MyChart before the doctors have had a chance to review them. We understand that in some cases there may be results that are confusing or concerning to you. Please understand that not all results are received at the same time and often the doctors may need to interpret multiple results in order to provide you with the best plan of care or course of treatment. Therefore, we ask that you please give [place] 2 business days to thoroughly review all your results before contacting the office for clarification. Should we see a critical lab result, you will be contacted sooner.   If You Need Anything After Your Visit  If you have any questions or concerns for your doctor, please call our main line at [phone] and press option 4 to reach your doctor's medical assistant. If no one answers, please leave a voicemail as directed and we will return your call as soon as possible. Messages left after 4 pm will be answered the following business day.   You may also send [place] a message via MyChart. We typically respond to MyChart messages within 1-2 business days.  For prescription refills, please ask your pharmacy to contact our office. Our fax number is [phone].  If you have an urgent issue when the clinic is closed that cannot wait until the next business day, you can page your doctor at the number below.    Please note that while we do our best to be  available for urgent issues outside of office hours, we are not available 24/7.   If you have an urgent issue and are unable to reach [place], you may choose to seek medical care at your doctor's office, retail clinic, urgent care center, or emergency room.  If you have a medical emergency, please immediately call 911 or go to the emergency department.  Pager Numbers  - Dr. Gwen Pounds: [phone]  - Dr. Neale Burly: [phone]  - Dr. Roseanne Reno: [phone]  In the event of inclement weather, please call our main line at [phone] for an update on the status of any delays or closures.  Dermatology Medication Tips: Please keep the boxes that topical medications come in in order to help keep track of the instructions about where and how to use these. Pharmacies typically print the medication instructions only on the boxes and not directly on the medication tubes.   If your medication is too expensive, please contact our office at [phone] option 4 or send [place] a message through MyChart.   We are unable to tell what your co-pay for medications will be in advance as this is different depending on your insurance coverage. However, we may be able to find a substitute medication at lower cost or fill out paperwork to get insurance to cover a needed medication.   If a prior authorization is required to get your medication covered by your insurance company, please allow [place] 1-2 business days to complete this process.  Drug prices often vary depending on where the prescription is filled and some  pharmacies may offer cheaper prices.  The website www.goodrx.com contains coupons for medications through different pharmacies. The prices here do not account for what the cost may be with help from insurance (it may be cheaper with your insurance), but the website can give you the price if you did not use any insurance.  - You can print the associated coupon and take it with your prescription to the pharmacy.  -  You may also stop by our office during regular business hours and pick up a GoodRx coupon card.  - If you need your prescription sent electronically to a different pharmacy, notify our office through [facility] or by phone at [phone] option 4.     Si Usted Necesita Algo Despus de Su Visita  Tambin puede enviarnos un mensaje a travs de Clinical cytogeneticist. Por lo general respondemos a los mensajes de MyChart en el transcurso de 1 a 2 das hbiles.  Para renovar recetas, por favor pida a su farmacia que se ponga en contacto con nuestra oficina. Annie Sable de fax es [place] [phone].  Si tiene un asunto urgente cuando la clnica est cerrada y que no puede esperar hasta el siguiente da hbil, puede llamar/localizar a su doctor(a) al nmero que aparece a continuacin.   Por favor, tenga en cuenta que aunque hacemos todo lo posible para estar disponibles para asuntos urgentes fuera del horario de [place], no estamos disponibles las 24 horas del da, los 7 [address]  [address] de la [place].   Si tiene un problema urgente y no puede comunicarse con nosotros, puede optar por buscar atencin mdica  en el consultorio de su doctor(a), en una clnica privada, en un centro de atencin urgente o en una sala de emergencias.  Si tiene Engineer, drilling, por favor llame inmediatamente al 911 o vaya a la sala de emergencias.  Nmeros de bper  - Dr. Gwen Pounds: [phone]  - Dra. Moye: [phone]  - Dra. Roseanne Reno: [phone]  En caso de inclemencias del [place], por favor llame a Lacy Duverney principal al [phone] para una actualizacin sobre el [place] de cualquier retraso o cierre.  Consejos para la medicacin en dermatologa: Por favor, guarde las cajas en las que vienen los medicamentos de uso tpico para ayudarle a seguir las instrucciones sobre dnde y cmo usarlos. Las farmacias generalmente imprimen las instrucciones del medicamento slo en las cajas y no directamente en los tubos del  [place].   Si su medicamento es muy caro, por favor, pngase en contacto con Rolm [place] llamando al [phone] y presione la opcin 4 o envenos un mensaje a travs de Clinical cytogeneticist.   No podemos decirle cul ser su copago por los medicamentos por adelantado ya que esto es diferente dependiendo de la cobertura de su seguro. Sin embargo, es posible que podamos encontrar un medicamento sustituto a Audiological scientist un formulario para que el seguro cubra el medicamento que se considera necesario.   Si se requiere una autorizacin previa para que su compaa de seguros [place] su medicamento, por favor permtanos de 1 a 2 das hbiles para completar [address]  Los precios de los medicamentos varan con frecuencia dependiendo del Environmental consultant de dnde se surte la receta y alguna farmacias pueden ofrecer precios ms baratos.  El sitio web www.goodrx.com tiene cupones para medicamentos de Health and safety inspector. Los precios aqu no tienen en cuenta lo que podra costar con la ayuda del seguro (puede ser ms barato con su seguro), pero el sitio web puede darle el precio si  no utiliz [employer].  - Puede imprimir el cupn correspondiente y llevarlo con su receta a la farmacia.  - Tambin puede pasar por nuestra oficina durante el horario de atencin regular y Charity fundraiser una tarjeta de cupones de GoodRx.  - Si necesita que su receta se enve electrnicamente a una farmacia diferente, informe a nuestra oficina a travs de MyChart de [place] o por telfono llamando al [phone] y presione la opcin 4.

## 2022-07-01 NOTE — Progress Notes (Signed)
   Follow-Up Visit   Subjective  Ashley Lewis is a 57 y.o. female who presents for the following:Patient here concerning flares from psoriasis. Reports long history of psoriasis. Has used several treatments. Currently using no treatment. Patient reports flared at scalp, face, legs, arms, elbows and buttocks.  Has used topical in past but currently using no treatments.   The following portions of the chart were reviewed this encounter and updated as appropriate: medications, allergies, medical history  Review of Systems:  No other skin or systemic complaints except as noted in HPI or Assessment and Plan.  Objective  Well appearing patient in no apparent distress; mood and affect are within normal limits. A focused examination was performed of the following areas: arms, face, scalp, legs Relevant exam findings are noted in the Assessment and Plan.   Assessment & Plan   PSORIASIS Exam: Well-demarcated erythematous papules/plaques with silvery scale, guttate pink scaly papules. 50% BSA.  Chronic and persistent condition with duration or expected duration over one year. Condition is bothersome/symptomatic for patient. Currently flared.  Patient has history of joint pain Report h/o Enbrel - failed Reports h/o of Humira - did well but after stroke was taken off treatment Reports after stroke clear for years without need for medication. Flared last year started [place] but after 1 injection broke out, had swelling in eyes, wheezing, and elevated liver test.   Denies congestive heart failure  Psoriasis is a chronic non-curable, but treatable genetic/hereditary disease that may have other systemic features affecting other organ systems such as joints (Psoriatic Arthritis). It is associated with an increased risk of inflammatory bowel disease, heart disease, non-alcoholic fatty liver disease, and depression.  Treatments include light and laser treatments; topical medications; and systemic  medications including oral and injectables.  Treatment Plan: Pending biologic lab panel will plan to restart patient on Humira.   Psoriasis  Related Procedures Comprehensive metabolic panel CBC with Differential/Platelet QuantiFERON-TB Gold Plus Hepatitis B surface antibody,qualitative Hepatitis B surface antibody,quantitative Hepatitis B surface antigen Hepatitis B core antibody, total Hepatitis C antibody HIV Antibody (routine testing w rflx)   Return in about 3 months (around 09/30/2022) for psoriasis follow up.  IAsher Muir, CMA, am acting as scribe for Armida Sans, MD.  Documentation: I have reviewed the above documentation for accuracy and completeness, and I agree with the above.  Armida Sans, MD

## 2022-07-02 ENCOUNTER — Encounter: Payer: Self-pay | Admitting: Dermatology

## 2022-07-05 LAB — COMPREHENSIVE METABOLIC PANEL
ALT: 27 IU/L (ref 0–32)
AST: 18 IU/L (ref 0–40)
Albumin/Globulin Ratio: 1.2 (ref 1.2–2.2)
Albumin: 4.3 g/dL (ref 3.8–4.9)
Alkaline Phosphatase: 78 IU/L (ref 44–121)
BUN/Creatinine Ratio: 16 (ref 9–23)
BUN: 10 mg/dL (ref 6–24)
Bilirubin Total: 0.3 mg/dL (ref 0.0–1.2)
CO2: 22 mmol/L (ref 20–29)
Calcium: 9 mg/dL (ref 8.7–10.2)
Chloride: 104 mmol/L (ref 96–106)
Creatinine, Ser: 0.63 mg/dL (ref 0.57–1.00)
Globulin, Total: 3.5 g/dL (ref 1.5–4.5)
Glucose: 105 mg/dL — ABNORMAL HIGH (ref 70–99)
Potassium: 3.6 mmol/L (ref 3.5–5.2)
Sodium: 142 mmol/L (ref 134–144)
Total Protein: 7.8 g/dL (ref 6.0–8.5)
eGFR: 104 mL/min/{1.73_m2} (ref >59)

## 2022-07-05 LAB — CBC WITH DIFFERENTIAL/PLATELET
Basophils Absolute: 0 10*3/uL (ref 0.0–0.2)
Basos: 1 %
EOS (ABSOLUTE): 0.2 10*3/uL (ref 0.0–0.4)
Eos: 2 %
Hematocrit: 39.9 % (ref 34.0–46.6)
Hemoglobin: 12.9 g/dL (ref 11.1–15.9)
Immature Grans (Abs): 0 10*3/uL (ref 0.0–0.1)
Immature Granulocytes: 0 %
Lymphocytes Absolute: 2.5 10*3/uL (ref 0.7–3.1)
Lymphs: 31 %
MCH: 27.4 pg (ref 26.6–33.0)
MCHC: 32.3 g/dL (ref 31.5–35.7)
MCV: 85 fL (ref 79–97)
Monocytes Absolute: 0.5 10*3/uL (ref 0.1–0.9)
Monocytes: 7 %
Neutrophils Absolute: 4.8 10*3/uL (ref 1.4–7.0)
Neutrophils: 59 %
Platelets: 248 10*3/uL (ref 150–450)
RBC: 4.71 x10E6/uL (ref 3.77–5.28)
RDW: 13 % (ref 11.7–15.4)
WBC: 8.1 10*3/uL (ref 3.4–10.8)

## 2022-07-05 LAB — HEPATITIS B SURFACE ANTIBODY,QUALITATIVE

## 2022-07-05 LAB — HEPATITIS B SURFACE ANTIGEN: Hepatitis B Surface Ag: NEGATIVE

## 2022-07-05 LAB — QUANTIFERON-TB GOLD PLUS
QuantiFERON Mitogen Value: >10 IU/mL
QuantiFERON Nil Value: 0.04 IU/mL
QuantiFERON TB1 Ag Value: 0.03 IU/mL
QuantiFERON TB2 Ag Value: 0.03 IU/mL
QuantiFERON-TB Gold Plus: NEGATIVE

## 2022-07-05 LAB — HIV ANTIBODY (ROUTINE TESTING W REFLEX): HIV Screen 4th Generation wRfx: NONREACTIVE

## 2022-07-05 LAB — HEPATITIS B SURFACE ANTIBODY, QUANTITATIVE: Hepatitis B Surf Ab Quant: <3.5 m[IU]/mL — ABNORMAL LOW (ref >9.9)

## 2022-07-05 LAB — HEPATITIS C ANTIBODY: Hep C Virus Ab: NONREACTIVE

## 2022-07-05 LAB — HEPATITIS B CORE ANTIBODY, TOTAL: Hep B Core Total Ab: NEGATIVE

## 2022-07-08 ENCOUNTER — Telehealth: Payer: Self-pay

## 2022-07-08 ENCOUNTER — Other Ambulatory Visit: Payer: Self-pay

## 2022-07-08 MED ORDER — HUMIRA-CD/UC/HS STARTER 80 MG/0.8ML ~~LOC~~ AJKT: 160.0000 mg | AUTO-INJECTOR | SUBCUTANEOUS | 0 refills | Status: DC

## 2022-07-08 MED ORDER — HUMIRA (2 PEN) 80 MG/0.8ML ~~LOC~~ PNKT: 80.0000 mg | PEN_INJECTOR | SUBCUTANEOUS | 5 refills | Status: DC

## 2022-07-08 NOTE — Telephone Encounter (Signed)
Discussed lab results with patient. Cherlyn Labella, CMA also responded to patient via MyChart. Advised patient to contact us if she hasn't heard from [facility] within the next 72 hours.

## 2022-07-08 NOTE — Telephone Encounter (Signed)
-----   Message from Deirdre Evener, MD sent at 07/07/2022  6:59 PM EDT ----- Labs from 07/01/2022 showed: Chemistries including liver and kidney are okay. Blood counts are okay.   QuantiFERON gold/TB test is negative/normal Hepatitis B and C testing was normal HIV testing was nonreactive  I recommend patient start Humira for her psoriasis She has done well on Humira in the past without side effects If the patient wants to pursue Humira treatment for psoriasis, you may send this in to the pharmacy.  She should keep her follow-up appointment October 16, 2022 at 11:15 AM.

## 2022-07-10 ENCOUNTER — Encounter: Payer: Self-pay | Admitting: Dermatology

## 2022-07-10 DIAGNOSIS — K5900 Constipation, unspecified: Secondary | ICD-10-CM | POA: Diagnosis not present

## 2022-07-10 DIAGNOSIS — R7401 Elevation of levels of liver transaminase levels: Secondary | ICD-10-CM | POA: Diagnosis not present

## 2022-07-10 DIAGNOSIS — K2100 Gastro-esophageal reflux disease with esophagitis, without bleeding: Secondary | ICD-10-CM | POA: Diagnosis not present

## 2022-07-19 ENCOUNTER — Encounter: Payer: Self-pay | Admitting: Dermatology

## 2022-07-19 ENCOUNTER — Telehealth (HOSPITAL_BASED_OUTPATIENT_CLINIC_OR_DEPARTMENT_OTHER): Payer: Self-pay | Admitting: Family Medicine

## 2022-07-19 NOTE — Telephone Encounter (Signed)
Called patient to schedule Medicare Annual Wellness Visit (AWV). Left message for patient to call back and schedule Medicare Annual Wellness Visit (AWV).  Last date of AWV:  due 08/24/2014 awvi per [place]  Please schedule an appointment at any time with [place], NHA. .  If any questions, please contact me at [phone].  Thank you,  Judeth Cornfield,  AMB Clinical Support [facility] AWV Program Direct Dial ??[phone]

## 2022-07-23 DIAGNOSIS — Z0100 Encounter for examination of eyes and vision without abnormal findings: Secondary | ICD-10-CM | POA: Diagnosis not present

## 2022-07-23 DIAGNOSIS — H524 Presbyopia: Secondary | ICD-10-CM | POA: Diagnosis not present

## 2022-07-23 NOTE — Telephone Encounter (Signed)
Original Humira RX sent in was for HS dosing. Called [facility] and clarified for Psoriasis dosing.

## 2022-07-30 ENCOUNTER — Encounter (HOSPITAL_BASED_OUTPATIENT_CLINIC_OR_DEPARTMENT_OTHER): Payer: Self-pay

## 2022-08-01 ENCOUNTER — Telehealth: Payer: Self-pay

## 2022-08-01 NOTE — Telephone Encounter (Signed)
Patient insurance now denying coverage of Humira due to patient must have inadequate treatment response or intolerance to either phototherapy -eg ultraviolet B (UVB), psolaren plus ultraviolet A (UVA) or pharmacological treatment with MTX, Cyclosporin, or Acetretin. Your thoughts?

## 2022-08-05 NOTE — Telephone Encounter (Signed)
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 Appeal checklist was sent to [employer] noting this and past history of effective Humira use.

## 2022-08-08 ENCOUNTER — Other Ambulatory Visit (HOSPITAL_BASED_OUTPATIENT_CLINIC_OR_DEPARTMENT_OTHER): Payer: Self-pay

## 2022-08-08 MED ORDER — AMLODIPINE BESYLATE 5 MG PO TABS: 5.0000 mg | ORAL_TABLET | Freq: Every day | ORAL | 0 refills | Status: DC

## 2022-08-08 MED ORDER — VALSARTAN 80 MG PO TABS: 80.0000 mg | ORAL_TABLET | Freq: Every day | ORAL | 0 refills | Status: DC

## 2022-08-11 ENCOUNTER — Encounter: Payer: Self-pay | Admitting: Dermatology

## 2022-08-13 ENCOUNTER — Encounter (HOSPITAL_BASED_OUTPATIENT_CLINIC_OR_DEPARTMENT_OTHER): Payer: Self-pay

## 2022-08-13 ENCOUNTER — Ambulatory Visit (INDEPENDENT_AMBULATORY_CARE_PROVIDER_SITE_OTHER): Payer: Medicare HMO

## 2022-08-13 VITALS — BP 134/85 | Ht 63.0 in | Wt 196.0 lb

## 2022-08-13 DIAGNOSIS — Z0000 Encounter for general adult medical examination without abnormal findings: Secondary | ICD-10-CM

## 2022-08-13 NOTE — Progress Notes (Signed)
 I connected with  Memory Dance on 08/13/22 by a audio enabled telemedicine application and verified that I am speaking with the correct person using two identifiers.  Patient Location: Home  Provider Location: Home Office  I discussed the limitations of evaluation and management by telemedicine. The patient expressed understanding and agreed to proceed. Subjective:   Ashley Lewis is a 57 y.o. female who presents for an Initial Medicare Annual Wellness Visit.  Review of Systems           Objective:    Today's Vitals   08/13/22 1136 08/13/22 1137  BP: 134/85   Weight: 196 lb (88.9 kg)   Height: 5\' 3"  (1.6 m)   PainSc: 1  1   PainLoc: Hip    Body mass index is 34.72 kg/m.     08/13/2022   11:53 AM 04/24/2022    7:33 PM 12/14/2020    5:26 PM 09/09/2016    7:04 AM 02/04/2015    9:00 AM  Advanced Directives  Does Patient Have a Medical Advance Directive? No No No No No  Would patient like information on creating a medical advance directive? No - Patient declined  No - Patient declined      Current Medications (verified) Outpatient Encounter Medications as of 08/13/2022  Medication Sig   amLODipine (NORVASC) 5 MG tablet Take 1 tablet (5 mg total) by mouth daily.   Evolocumab (REPATHA SURECLICK) 140 MG/ML SOAJ INJECT 140 MG INTO THE SKIN EVERY 14 (FOURTEEN) DAYS.   ibuprofen (ADVIL) 800 MG tablet Take 800 mg by mouth every 8 (eight) hours as needed (pain).   omeprazole (PRILOSEC) 40 MG capsule Take 40 mg by mouth daily.   valsartan (DIOVAN) 80 MG tablet Take 1 tablet (80 mg total) by mouth daily.   Adalimumab (HUMIRA, 2 PEN,) 80 MG/0.8ML PNKT Inject 80 mg into the skin every 14 (fourteen) days. Starting on day 29. (Patient not taking: Reported on 08/13/2022)   Adalimumab (HUMIRA-CD/UC/HS STARTER) 80 MG/0.8ML PNKT Inject 160 mg into the skin as directed. Inject 160 mg subcutaneous on day 1, then 80 mg subcutaneous on day 15. (Patient not taking: Reported on  08/13/2022)   aspirin EC 81 MG tablet Take 81 mg by mouth daily. (Patient not taking: Reported on 08/13/2022)   fluticasone (FLONASE) 50 MCG/ACT nasal spray Place 1 spray into both nostrils as needed. (Patient not taking: Reported on 08/13/2022)   No facility-administered encounter medications on file as of 08/13/2022.    Allergies (verified) Lisinopril and Risankizumab   History: Past Medical History:  Diagnosis Date   Anemia    iron defi   Atypical chest pain 08/05/2019   Depression    sees Dr. Leigh Aurora   Eczema    Exertional dyspnea 10/29/2021   GERD (gastroesophageal reflux disease)    Hyperlipidemia    Hypertension    Psoriasis    Stroke [facility]) 2011   left thalamic infarct   Past Surgical History:  Procedure Laterality Date   CERVICAL CERCLAGE     x 2, in 1995, 1997   CHOLECYSTECTOMY N/A 02/03/2015   Procedure: LAPAROSCOPIC CHOLECYSTECTOMY;  Surgeon: De Blanch Kinsinger, MD;  Location: [facility] OR;  Service: General;  Laterality: N/A;   Family History  Problem Relation Age of Onset   Cancer Mother 3       breast cancer x 2   Cancer Father 65       pancreatic   Cancer Maternal Grandmother    Heart failure Maternal Grandfather  Social History   Socioeconomic History   Marital status: Single    Spouse name: Not on file   Number of children: Not on file   Years of education: Not on file   Highest education level: Not on file  Occupational History   Not on file  Tobacco Use   Smoking status: Former    Packs/day: .5    Types: Cigarettes    Start date: 03/25/2009    Quit date: 04/14/2010    Years since quitting: 12.3   Smokeless tobacco: Never  Substance and Sexual Activity   Alcohol use: No   Drug use: Yes    Types: Marijuana   Sexual activity: Yes  Other Topics Concern   Not on file  Social History Narrative   Divorced, mother of 4 children, 3 still live with patient. Has 80 yo daughter with severe MR and special needs.    Does not work outside home. Working to  try for disability, has lawyers currently.            Epworth Sleepiness Scale = 6 (as of 05/26/2015)   Social Determinants of Health   Financial Resource Strain: Low Risk  (08/13/2022)   Overall Financial Resource Strain (CARDIA)    Difficulty of Paying Living Expenses: Not hard at all  Food Insecurity: No Food Insecurity (08/13/2022)   Hunger Vital Sign    Worried About Running Out of Food in the Last Year: Never true    Ran Out of Food in the Last Year: Never true  Transportation Needs: No Transportation Needs (08/13/2022)   PRAPARE - Administrator, Civil Service (Medical): No    Lack of Transportation (Non-Medical): No  Physical Activity: Insufficiently Active (08/13/2022)   Exercise Vital Sign    Days of Exercise per Week: 7 days    Minutes of Exercise per Session: 20 min  Stress: No Stress Concern Present (08/13/2022)   [employer] of Occupational Health - Occupational Stress Questionnaire    Feeling of Stress : Not at all  Social Connections: Socially Integrated (08/13/2022)   Social Connection and Isolation Panel [NHANES]    Frequency of Communication with Friends and Family: More than three times a week    Frequency of Social Gatherings with Friends and Family: More than three times a week    Attends Religious Services: More than 4 times per year    Active Member of [employer] or Organizations: Yes    Attends Engineer, structural: More than 4 times per year    Marital Status: Married    Tobacco Counseling Counseling given: Yes   Clinical Intake:  Pre-visit preparation completed: Yes  Pain : 0-10 Pain Score: 1  Pain Type: Chronic pain Pain Location: Hip Pain Orientation: Left Pain Radiating Towards: down leg and across buttocks and across lower back Pain Descriptors / Indicators: Shooting, Throbbing Pain Onset: More than a month ago Pain Frequency: Constant     BMI - recorded: 34.72 Nutritional Status: BMI > 30  Obese Nutritional Risks:  None Diabetes: No  How often do you need to have someone help you when you read instructions, pamphlets, or other written materials from your doctor or pharmacy?: 1 - Never  Diabetic?No  Interpreter Needed?: No  Information entered by ::  Christene Pounds, CMA   Activities of Daily Living    04/09/2022    1:43 PM 03/12/2022   10:41 AM  In your present state of health, do you have any difficulty performing the following  activities:  Hearing? 0 0  Vision? 0 0  Difficulty concentrating or making decisions? 0 0  Walking or climbing stairs? 0 0  Dressing or bathing? 0 0  Doing errands, shopping? 0 0    Patient Care Team: de [place], Buren Kos, MD as PCP - General (Family Medicine) Chilton Si, MD as PCP - Cardiology (Cardiology) Chilton Si, MD as Attending Physician (Cardiology) Deirdre Evener, MD (Dermatology) Jeani Hawking, MD as Consulting Physician (Gastroenterology) Dayna Barker, MD as Referring Physician (Internal Medicine) Genelle Gather, OD (Optometry)  Indicate any recent Medical Services you may have received from other than [facility] providers in the past year (date may be approximate).     Assessment:   This is a routine wellness examination for Loriann.  Hearing/Vision screen Hearing Screening - Comments:: Patient denies any hearing difficulties.   Vision Screening - Comments:: Patient wears glasses and is up to date on her yearly eye exam   Dietary issues and exercise activities discussed:     Goals Addressed             This Visit's Progress    Patient Stated       Patient's goal is to increase her water intake and eat more fruits and vegetables       Depression Screen    08/13/2022   11:46 AM 04/09/2022    1:42 PM 03/12/2022   10:41 AM  PHQ 2/9 Scores  PHQ - 2 Score 2 2 0  PHQ- 9 Score 7 6 0  Exception Documentation  Medical reason Medical reason    Fall Risk    08/13/2022   11:54 AM 04/09/2022    1:42 PM 03/12/2022    10:39 AM  Fall Risk   Falls in the past year? 0 0 0  Number falls in past yr: 0 0 0  Injury with Fall? 0 0 0  Risk for fall due to : No Fall Risks No Fall Risks No Fall Risks  Follow up Falls prevention discussed Falls evaluation completed Falls evaluation completed    FALL RISK PREVENTION PERTAINING TO THE HOME:  Any stairs in or around the home? No  If so, are there any without handrails? No  Home free of loose throw rugs in walkways, pet beds, electrical cords, etc? Yes  Adequate lighting in your home to reduce risk of falls? Yes   ASSISTIVE DEVICES UTILIZED TO PREVENT FALLS:  Life alert? No  Use of a cane, walker or w/c? No  Grab bars in the bathroom? No  Shower chair or bench in shower? No  Elevated toilet seat or a handicapped toilet? No   TIMED UP AND GO:  Was the test performed? No . Telephone visit  Cognitive Function:        08/13/2022   11:57 AM  6CIT Screen  What Year? 0 points  What month? 0 points  What time? 0 points  Count back from 20 0 points  Months in reverse 0 points  Repeat phrase 0 points  Total Score 0 points    Immunizations Immunization History  Administered Date(s) Administered   Td 11/16/2009    TDAP status: Due, Education has been provided regarding the importance of this vaccine. Advised may receive this vaccine at local pharmacy or Health Dept. Aware to provide a copy of the vaccination record if obtained from local pharmacy or Health Dept. Verbalized acceptance and understanding.  Flu Vaccine status: Up to date  Pneumococcal vaccine status: Due, Education has been  provided regarding the importance of this vaccine. Advised may receive this vaccine at local pharmacy or Health Dept. Aware to provide a copy of the vaccination record if obtained from local pharmacy or Health Dept. Verbalized acceptance and understanding.  Covid-19 vaccine status: Information provided on how to obtain vaccines.   Qualifies for Shingles Vaccine? Yes    Zostavax completed No   Shingrix Completed?: No.    Education has been provided regarding the importance of this vaccine. Patient has been advised to call insurance company to determine out of pocket expense if they have not yet received this vaccine. Advised may also receive vaccine at local pharmacy or Health Dept. Verbalized acceptance and understanding.  Screening Tests Health Maintenance  Topic Date Due   DTaP/Tdap/Td (2 - Tdap) 11/17/2019   PAP SMEAR-Modifier  08/20/2022 (Originally 06/21/2019)   COVID-19 Vaccine (1) 09/22/2023 (Originally [birth year]/1970)   Zoster Vaccines- Shingrix (1 of 2) 11/24/2023 (Originally 11/21/1984)   INFLUENZA VACCINE  10/24/2022   Medicare Annual Wellness (AWV)  08/13/2023   MAMMOGRAM  10/24/2023   COLONOSCOPY (Pts 45-75yrs Insurance coverage will need to be confirmed)  06/12/2032   Hepatitis C Screening  Completed   HIV Screening  Completed   HPV VACCINES  Aged Out    Health Maintenance  Health Maintenance Due  Topic Date Due   DTaP/Tdap/Td (2 - Tdap) 11/17/2019    Colorectal cancer screening: Type of screening: Colonoscopy. Completed 06/13/22. Repeat every 10 years  Mammogram status: Completed 10/2021 Patient has an appt to have mammogram updated in August. Repeat every year  [facility] at [facility]  Lung Cancer Screening: (Low Dose CT Chest recommended if Age 54-80 years, 30 pack-year currently smoking OR have quit w/in 15years.) does not qualify.    Additional Screening:  Hepatitis C Screening: does qualify; Completed 07/01/2022  Vision Screening: Recommended annual ophthalmology exams for early detection of glaucoma and other disorders of the eye. Is the patient up to date with their annual eye exam?  Yes  Who is the provider or what is the name of the office in which the patient attends annual eye exams? [facility] If pt is not established with a provider, would they like to be referred to a provider to establish care? N/A   Dental  Screening: Recommended annual dental exams for proper oral hygiene  Community Resource Referral / Chronic Care Management: CRR required this visit?  No   CCM required this visit?  No      Plan:     I have personally reviewed and noted the following in the patient's chart:   Medical and social history Use of alcohol, tobacco or illicit drugs  Current medications and supplements including opioid prescriptions. Patient is not currently taking opioid prescriptions. Functional ability and status Nutritional status Physical activity Advanced directives List of other physicians Hospitalizations, surgeries, and ER visits in previous 12 months Vitals Screenings to include cognitive, depression, and falls Referrals and appointments  In addition, I have reviewed and discussed with patient certain preventive protocols, quality metrics, and best practice recommendations. A written personalized care plan for preventive services as well as general preventive health recommendations were provided to patient.    Due to this being a telephonic visit, the after visit summary with patients personalized plan was offered to patient via mail or my-chart. Patient would like to access their AVS via my-chart   Jordan Hawks Xxavier Noon, CMA   08/13/2022   Nurse Notes: Advised patient to discuss ASA 81mg  with  Dr.De Peru. She stated that when she is taking her Ibuprofen 800mg  she does not take the ASA 81

## 2022-08-13 NOTE — Patient Instructions (Signed)
Ashley Lewis , Thank you for taking time to come for your Medicare Wellness Visit. I appreciate your ongoing commitment to your health goals. Please review the following plan we discussed and let me know if I can assist you in the future.   These are the goals we discussed:  Goals   None     This is a list of the screening recommended for you and due dates:  Health Maintenance  Topic Date Due   COVID-19 Vaccine (1) Never done   Zoster (Shingles) Vaccine (1 of 2) Never done   Colon Cancer Screening  Never done   Mammogram  11/22/2015   Pap Smear  06/21/2019   DTaP/Tdap/Td vaccine (2 - Tdap) 11/17/2019   Flu Shot  10/24/2022   Medicare Annual Wellness Visit  08/13/2023   Hepatitis C Screening: USPSTF Recommendation to screen - Ages 18-79 yo.  Completed   HIV Screening  Completed   HPV Vaccine  Aged Out    Advanced directives: Advance directive discussed with you today. Even though you declined this today, please call our office should you change your mind, and we can give you the proper paperwork for you to fill out. Advance care planning is a way to make decisions about medical care that fits your values in case you are ever unable to make these decisions for yourself.   Conditions/risks identified: Aim for 30 minutes of exercise or brisk walking, 6-8 glasses of water, and 5 servings of fruits and vegetables each day.   Next appointment: Follow up in one year for your annual wellness visit. Aug 19, 2023 @ 11AM via telephone  Preventive Care 40-64 Years, Female Preventive care refers to lifestyle choices and visits with your health care provider that can promote health and wellness. What does preventive care include? A yearly physical exam. This is also called an annual well check. Dental exams once or twice a year. Routine eye exams. Ask your health care provider how often you should have your eyes checked. Personal lifestyle choices, including: Daily care of your teeth and  gums. Regular physical activity. Eating a healthy diet. Avoiding tobacco and drug use. Limiting alcohol use. Practicing safe sex. Taking low-dose aspirin daily starting at age 74. Taking vitamin and mineral supplements as recommended by your health care provider. What happens during an annual well check? The services and screenings done by your health care provider during your annual well check will depend on your age, overall health, lifestyle risk factors, and family history of disease. Counseling  Your health care provider may ask you questions about your: Alcohol use. Tobacco use. Drug use. Emotional well-being. Home and relationship well-being. Sexual activity. Eating habits. Work and work Astronomer. Method of birth control. Menstrual cycle. Pregnancy history. Screening  You may have the following tests or measurements: Height, weight, and BMI. Blood pressure. Lipid and cholesterol levels. These may be checked every 5 years, or more frequently if you are over 20 years old. Skin check. Lung cancer screening. You may have this screening every year starting at age 21 if you have a 30-pack-year history of smoking and currently smoke or have quit within the past 15 years. Fecal occult blood test (FOBT) of the stool. You may have this test every year starting at age 35. Flexible sigmoidoscopy or colonoscopy. You may have a sigmoidoscopy every 5 years or a colonoscopy every 10 years starting at age 93. Hepatitis C blood test. Hepatitis B blood test. Sexually transmitted disease (STD) testing. Diabetes screening.  This is done by checking your blood sugar (glucose) after you have not eaten for a while (fasting). You may have this done every 1-3 years. Mammogram. This may be done every 1-2 years. Talk to your health care provider about when you should start having regular mammograms. This may depend on whether you have a family history of breast cancer. BRCA-related cancer  screening. This may be done if you have a family history of breast, ovarian, tubal, or peritoneal cancers. Pelvic exam and Pap test. This may be done every 3 years starting at age 63. Starting at age 64, this may be done every 5 years if you have a Pap test in combination with an HPV test. Bone density scan. This is done to screen for osteoporosis. You may have this scan if you are at high risk for osteoporosis. Discuss your test results, treatment options, and if necessary, the need for more tests with your health care provider. Vaccines  Your health care provider may recommend certain vaccines, such as: Influenza vaccine. This is recommended every year. Tetanus, diphtheria, and acellular pertussis (Tdap, Td) vaccine. You may need a Td booster every 10 years. Zoster vaccine. You may need this after age 77. Pneumococcal 13-valent conjugate (PCV13) vaccine. You may need this if you have certain conditions and were not previously vaccinated. Pneumococcal polysaccharide (PPSV23) vaccine. You may need one or two doses if you smoke cigarettes or if you have certain conditions. Talk to your health care provider about which screenings and vaccines you need and how often you need them. This information is not intended to replace advice given to you by your health care provider. Make sure you discuss any questions you have with your health care provider. Document Released: 04/07/2015 Document Revised: 11/29/2015 Document Reviewed: 01/10/2015 Elsevier Interactive Patient Education  2017 [employer].    Fall Prevention in the Home Falls can cause injuries. They can happen to people of all ages. There are many things you can do to make your home safe and to help prevent falls. What can I do on the outside of my home? Regularly fix the edges of walkways and driveways and fix any cracks. Remove anything that might make you trip as you walk through a door, such as a raised step or threshold. Trim any  bushes or trees on the path to your home. Use bright outdoor lighting. Clear any walking paths of anything that might make someone trip, such as rocks or tools. Regularly check to see if handrails are loose or broken. Make sure that both sides of any steps have handrails. Any raised decks and porches should have guardrails on the edges. Have any leaves, snow, or ice cleared regularly. Use sand or salt on walking paths during winter. Clean up any spills in your garage right away. This includes oil or grease spills. What can I do in the bathroom? Use night lights. Install grab bars by the toilet and in the tub and shower. Do not use towel bars as grab bars. Use non-skid mats or decals in the tub or shower. If you need to sit down in the shower, use a plastic, non-slip stool. Keep the floor dry. Clean up any water that spills on the floor as soon as it happens. Remove soap buildup in the tub or shower regularly. Attach bath mats securely with double-sided non-slip rug tape. Do not have throw rugs and other things on the floor that can make you trip. What can I do in the bedroom?  Use night lights. Make sure that you have a light by your bed that is easy to reach. Do not use any sheets or blankets that are too big for your bed. They should not hang down onto the floor. Have a firm chair that has side arms. You can use this for support while you get dressed. Do not have throw rugs and other things on the floor that can make you trip. What can I do in the kitchen? Clean up any spills right away. Avoid walking on wet floors. Keep items that you use a lot in easy-to-reach places. If you need to reach something above you, use a strong step stool that has a grab bar. Keep electrical cords out of the way. Do not use floor polish or wax that makes floors slippery. If you must use wax, use non-skid floor wax. Do not have throw rugs and other things on the floor that can make you trip. What can I do  with my stairs? Do not leave any items on the stairs. Make sure that there are handrails on both sides of the stairs and use them. Fix handrails that are broken or loose. Make sure that handrails are as long as the stairways. Check any carpeting to make sure that it is firmly attached to the stairs. Fix any carpet that is loose or worn. Avoid having throw rugs at the top or bottom of the stairs. If you do have throw rugs, attach them to the floor with carpet tape. Make sure that you have a light switch at the top of the stairs and the bottom of the stairs. If you do not have them, ask someone to add them for you. What else can I do to help prevent falls? Wear shoes that: Do not have high heels. Have rubber bottoms. Are comfortable and fit you well. Are closed at the toe. Do not wear sandals. If you use a stepladder: Make sure that it is fully opened. Do not climb a closed stepladder. Make sure that both sides of the stepladder are locked into place. Ask someone to hold it for you, if possible. Clearly mark and make sure that you can see: Any grab bars or handrails. First and last steps. Where the edge of each step is. Use tools that help you move around (mobility aids) if they are needed. These include: Canes. Walkers. Scooters. Crutches. Turn on the lights when you go into a dark area. Replace any light bulbs as soon as they burn out. Set up your furniture so you have a clear path. Avoid moving your furniture around. If any of your floors are uneven, fix them. If there are any pets around you, be aware of where they are. Review your medicines with your doctor. Some medicines can make you feel dizzy. This can increase your chance of falling. Ask your doctor what other things that you can do to help prevent falls. This information is not intended to replace advice given to you by your health care provider. Make sure you discuss any questions you have with your health care  provider. Document Released: 01/05/2009 Document Revised: 08/17/2015 Document Reviewed: 04/15/2014 Elsevier Interactive Patient Education  2017 [employer].

## 2022-08-22 ENCOUNTER — Encounter (HOSPITAL_BASED_OUTPATIENT_CLINIC_OR_DEPARTMENT_OTHER): Payer: Self-pay | Admitting: Family Medicine

## 2022-08-22 ENCOUNTER — Ambulatory Visit (INDEPENDENT_AMBULATORY_CARE_PROVIDER_SITE_OTHER): Payer: Medicare HMO | Admitting: Family Medicine

## 2022-08-22 VITALS — BP 140/81 | HR 81 | Ht 63.0 in | Wt 203.0 lb

## 2022-08-22 DIAGNOSIS — G8929 Other chronic pain: Secondary | ICD-10-CM

## 2022-08-22 DIAGNOSIS — I10 Essential (primary) hypertension: Secondary | ICD-10-CM | POA: Diagnosis not present

## 2022-08-22 DIAGNOSIS — M5440 Lumbago with sciatica, unspecified side: Secondary | ICD-10-CM

## 2022-08-22 MED ORDER — AMLODIPINE BESYLATE 5 MG PO TABS: 5.0000 mg | ORAL_TABLET | Freq: Every day | ORAL | 1 refills | Status: DC

## 2022-08-22 MED ORDER — VALSARTAN 80 MG PO TABS: 80.0000 mg | ORAL_TABLET | Freq: Every day | ORAL | 1 refills | Status: DC

## 2022-08-22 NOTE — Progress Notes (Signed)
    Procedures performed today:    None.  Independent interpretation of notes and tests performed by another provider:   None.  Brief History, Exam, Impression, and Recommendations:    BP (!) 140/81 (BP Location: Right Arm, Patient Position: Sitting, Cuff Size: Normal)   Pulse 81   Ht 5\' 3"  (1.6 m)   Wt 203 lb (92.1 kg)   SpO2 99%   BMI 35.96 kg/m   Essential hypertension, benign Patient continues with amlodipine and valsartan, no reported concerns.  No current issues with chest pain or headaches.  Reports that she continues taking medication regularly. Blood pressure is borderline in office today, did improve on recheck Recommend intermittent monitoring at home, DASH diet Will continue with current medication regimen, no adjustments to be made today Continue to monitor blood pressure closely and if continue to be borderline, would look to make dose adjustment, likely with increase in dose of valsartan  Left low back pain Unfortunately, patient continues to have low back pain.  She has had trial of conservative measures without significant improvement in symptoms.  No changes in severity of symptoms, no new symptoms such as bowel or bladder incontinence. We discussed options today.  Given that she has had trial of conservative therapy and continues to have moderate pain, we can proceed with referral to orthopedic surgeon for further evaluation and recommendations.  Return in about 3 months (around 11/22/2022) for HTN.   ___________________________________________ Jarrad Mclees de Peru, MD, ABFM, [facility] Sports [facility]

## 2022-08-30 ENCOUNTER — Ambulatory Visit (HOSPITAL_BASED_OUTPATIENT_CLINIC_OR_DEPARTMENT_OTHER): Payer: Medicare HMO | Admitting: Orthopaedic Surgery

## 2022-09-06 ENCOUNTER — Ambulatory Visit (HOSPITAL_BASED_OUTPATIENT_CLINIC_OR_DEPARTMENT_OTHER): Payer: Medicare HMO | Admitting: Orthopaedic Surgery

## 2022-09-11 ENCOUNTER — Encounter (HOSPITAL_BASED_OUTPATIENT_CLINIC_OR_DEPARTMENT_OTHER): Payer: Self-pay | Admitting: Orthopaedic Surgery

## 2022-09-11 ENCOUNTER — Ambulatory Visit (INDEPENDENT_AMBULATORY_CARE_PROVIDER_SITE_OTHER): Payer: Medicare HMO | Admitting: Orthopaedic Surgery

## 2022-09-11 DIAGNOSIS — M5116 Intervertebral disc disorders with radiculopathy, lumbar region: Secondary | ICD-10-CM

## 2022-09-11 NOTE — Progress Notes (Signed)
Chief Complaint: Radiating lower back pain     History of Present Illness:    Ashley Lewis is a 57 y.o. female presents today with ongoing radiating lower back pain down the right leg.  She states the second and third toes are now completely numb.  She has previously been seen by Dr. De Peru who obtained x-rays.  She is here today for further discussion as she is quite impaired with any type of ambulation.  She does have a history of a stroke in 2011.  She has a caregiver to her daughter    Surgical History:   None  PMH/PSH/Family History/Social History/Meds/Allergies:    Past Medical History:  Diagnosis Date   Anemia    iron defi   Atypical chest pain 08/05/2019   Depression    sees Dr. Leigh Aurora   Eczema    Exertional dyspnea 10/29/2021   GERD (gastroesophageal reflux disease)    Hyperlipidemia    Hypertension    Psoriasis    Stroke [facility]) 2011   left thalamic infarct   Past Surgical History:  Procedure Laterality Date   CERVICAL CERCLAGE     x 2, in 1995, 1997   CHOLECYSTECTOMY N/A 02/03/2015   Procedure: LAPAROSCOPIC CHOLECYSTECTOMY;  Surgeon: Rodman Pickle, MD;  Location: [facility] OR;  Service: General;  Laterality: N/A;   Social History   Socioeconomic History   Marital status: Single    Spouse name: Not on file   Number of children: Not on file   Years of education: Not on file   Highest education level: Not on file  Occupational History   Not on file  Tobacco Use   Smoking status: Former    Packs/day: .5    Types: Cigarettes    Start date: 03/25/2009    Quit date: 04/14/2010    Years since quitting: 12.4   Smokeless tobacco: Never  Substance and Sexual Activity   Alcohol use: No   Drug use: Yes    Types: Marijuana   Sexual activity: Yes  Other Topics Concern   Not on file  Social History Narrative   Divorced, mother of 4 children, 3 still live with patient. Has 64 yo daughter with severe MR and special needs.     Does not work outside home. Working to try for disability, has lawyers currently.            Epworth Sleepiness Scale = 6 (as of 05/26/2015)   Social Determinants of Health   Financial Resource Strain: Low Risk  (08/13/2022)   Overall Financial Resource Strain (CARDIA)    Difficulty of Paying Living Expenses: Not hard at all  Food Insecurity: No Food Insecurity (08/13/2022)   Hunger Vital Sign    Worried About Running Out of Food in the Last Year: Never true    Ran Out of Food in the Last Year: Never true  Transportation Needs: No Transportation Needs (08/13/2022)   PRAPARE - Administrator, Civil Service (Medical): No    Lack of Transportation (Non-Medical): No  Physical Activity: Insufficiently Active (08/13/2022)   Exercise Vital Sign    Days of Exercise per Week: 7 days    Minutes of Exercise per Session: 20 min  Stress: No Stress Concern Present (08/13/2022)   [employer] of Occupational Health - Occupational  Stress Questionnaire    Feeling of Stress : Not at all  Social Connections: Socially Integrated (08/13/2022)   Social Connection and Isolation Panel [NHANES]    Frequency of Communication with Friends and Family: More than three times a week    Frequency of Social Gatherings with Friends and Family: More than three times a week    Attends Religious Services: More than 4 times per year    Active Member of [employer] or Organizations: Yes    Attends Engineer, structural: More than 4 times per year    Marital Status: Married   Family History  Problem Relation Age of Onset   Cancer Mother 19       breast cancer x 2   Cancer Father 25       pancreatic   Cancer Maternal Grandmother    Heart failure Maternal Grandfather    Allergies  Allergen Reactions   Lisinopril Cough and Other (See Comments)    Other Reaction(s): Cough (ALLERGY/intolerance)  Drip in her throat, coughing, and couldn't breathe   Risankizumab Rash, Swelling and Anaphylaxis    Current Outpatient Medications  Medication Sig Dispense Refill   Adalimumab (HUMIRA, 2 PEN,) 80 MG/0.8ML PNKT Inject 80 mg into the skin every 14 (fourteen) days. Starting on day 29. (Patient not taking: Reported on 08/13/2022) 2 each 5   Adalimumab (HUMIRA-CD/UC/HS STARTER) 80 MG/0.8ML PNKT Inject 160 mg into the skin as directed. Inject 160 mg subcutaneous on day 1, then 80 mg subcutaneous on day 15. (Patient not taking: Reported on 08/13/2022) 1 each 0   albuterol (VENTOLIN HFA) 108 (90 Base) MCG/ACT inhaler Inhale into the lungs.     amLODipine (NORVASC) 5 MG tablet Take 1 tablet (5 mg total) by mouth daily. 90 tablet 1   aspirin EC 81 MG tablet Take 81 mg by mouth daily. (Patient not taking: Reported on 08/13/2022)     Evolocumab (REPATHA SURECLICK) 140 MG/ML SOAJ INJECT 140 MG INTO THE SKIN EVERY 14 (FOURTEEN) DAYS. 6 mL 2   fluticasone (FLONASE) 50 MCG/ACT nasal spray Place 1 spray into both nostrils as needed. (Patient not taking: Reported on 08/13/2022)     ibuprofen (ADVIL) 800 MG tablet Take 800 mg by mouth every 8 (eight) hours as needed (pain).     lidocaine (XYLOCAINE) 2 % solution 5 mLs 3 (three) times daily as needed.     omeprazole (PRILOSEC) 40 MG capsule Take 40 mg by mouth daily.     valsartan (DIOVAN) 80 MG tablet Take 1 tablet (80 mg total) by mouth daily. 90 tablet 1   No current facility-administered medications for this visit.   No results found.  Review of Systems:   A ROS was performed including pertinent positives and negatives as documented in the HPI.  Physical Exam :   Constitutional: NAD and appears stated age Neurological: Alert and oriented Psych: Appropriate affect and cooperative There were no vitals taken for this visit.   Comprehensive Musculoskeletal Exam:    Positive straight leg raise on the right.  She does not have any pain with 30 degrees internal rotation of the right hip.  Distal neurosensory exam is intact aside from subjective numbness in  the second and third toes of the right foot  Imaging:   Xray (4 views lumbar spine): Normal   I personally reviewed and interpreted the radiographs.   Assessment:   57 y.o. female with first to lower back pain consistent with lumbar radiculitis.  At this time given  her progressive findings and numbness in the right second and third toe I would like to obtain an MRI so that we can rule out any type of underlying lumbar radiculopathy or critical stenosis.  I will plan to see her back following discuss results  Plan :    -Plan for MRI lumbar spine and follow-up to discuss results     I personally saw and evaluated the patient, and participated in the management and treatment plan.  Huel Cote, MD Attending Physician, Orthopedic Surgery  This document was dictated using Dragon voice recognition software. A reasonable attempt at proof reading has been made to minimize errors.

## 2022-09-17 ENCOUNTER — Ambulatory Visit (HOSPITAL_COMMUNITY): Admission: RE | Admit: 2022-09-17 | Payer: Medicare HMO | Source: Ambulatory Visit

## 2022-09-22 ENCOUNTER — Encounter (HOSPITAL_BASED_OUTPATIENT_CLINIC_OR_DEPARTMENT_OTHER): Payer: Self-pay | Admitting: Orthopaedic Surgery

## 2022-09-23 ENCOUNTER — Other Ambulatory Visit (HOSPITAL_BASED_OUTPATIENT_CLINIC_OR_DEPARTMENT_OTHER): Payer: Self-pay | Admitting: Orthopaedic Surgery

## 2022-09-23 MED ORDER — METHYLPREDNISOLONE 4 MG PO TBPK: ORAL_TABLET | ORAL | 0 refills | Status: DC

## 2022-09-25 NOTE — Assessment & Plan Note (Signed)
Patient continues with amlodipine and valsartan, no reported concerns.  No current issues with chest pain or headaches.  Reports that she continues taking medication regularly. Blood pressure is borderline in office today, did improve on recheck Recommend intermittent monitoring at home, DASH diet Will continue with current medication regimen, no adjustments to be made today Continue to monitor blood pressure closely and if continue to be borderline, would look to make dose adjustment, likely with increase in dose of valsartan

## 2022-09-25 NOTE — Assessment & Plan Note (Signed)
Unfortunately, patient continues to have low back pain.  She has had trial of conservative measures without significant improvement in symptoms.  No changes in severity of symptoms, no new symptoms such as bowel or bladder incontinence. We discussed options today.  Given that she has had trial of conservative therapy and continues to have moderate pain, we can proceed with referral to orthopedic surgeon for further evaluation and recommendations.

## 2022-10-01 ENCOUNTER — Ambulatory Visit (HOSPITAL_COMMUNITY): Payer: Medicare HMO

## 2022-10-03 ENCOUNTER — Other Ambulatory Visit (HOSPITAL_BASED_OUTPATIENT_CLINIC_OR_DEPARTMENT_OTHER): Payer: Self-pay | Admitting: Orthopaedic Surgery

## 2022-10-03 DIAGNOSIS — M5116 Intervertebral disc disorders with radiculopathy, lumbar region: Secondary | ICD-10-CM

## 2022-10-07 ENCOUNTER — Encounter (HOSPITAL_BASED_OUTPATIENT_CLINIC_OR_DEPARTMENT_OTHER): Payer: Self-pay | Admitting: *Deleted

## 2022-10-11 ENCOUNTER — Ambulatory Visit (HOSPITAL_BASED_OUTPATIENT_CLINIC_OR_DEPARTMENT_OTHER): Payer: Medicare HMO | Admitting: Orthopaedic Surgery

## 2022-10-14 ENCOUNTER — Encounter (HOSPITAL_BASED_OUTPATIENT_CLINIC_OR_DEPARTMENT_OTHER): Payer: Self-pay | Admitting: Family Medicine

## 2022-10-16 ENCOUNTER — Ambulatory Visit: Payer: Medicare HMO | Admitting: Dermatology

## 2022-10-16 DIAGNOSIS — Z7189 Other specified counseling: Secondary | ICD-10-CM

## 2022-10-16 DIAGNOSIS — L918 Other hypertrophic disorders of the skin: Secondary | ICD-10-CM

## 2022-10-16 DIAGNOSIS — Z79899 Other long term (current) drug therapy: Secondary | ICD-10-CM

## 2022-10-16 DIAGNOSIS — L409 Psoriasis, unspecified: Secondary | ICD-10-CM | POA: Diagnosis not present

## 2022-10-16 NOTE — Patient Instructions (Signed)

## 2022-10-16 NOTE — Progress Notes (Signed)
   Follow-Up Visit   Subjective  Ashley Lewis is a 57 y.o. female who presents for the following: 3 month psoriasis. Patient started Humira 6 weeks ago. There was a  delay in her starting medication due to insurance denial and appeal. Patient is self-injecting at home. She has noticed some improvement and does not have many active areas today. Also wants to know if she can have labs done yet to check liver and kidney.   The following portions of the chart were reviewed this encounter and updated as appropriate: medications, allergies, medical history  Review of Systems:  No other skin or systemic complaints except as noted in HPI or Assessment and Plan.  Objective  Well appearing patient in no apparent distress; mood and affect are within normal limits. A focused examination was performed of the following areas: Legs, arms, nails, face Relevant exam findings are noted in the Assessment and Plan.   Assessment & Plan   PSORIASIS Exam: plaques significantly improved, hyperpigmented psoriatic plaques with nail changes 50% BSA. Chronic and persistent condition with duration or expected duration over one year. Condition is symptomatic / bothersome to patient. Not to goal. Patient has history of joint pain Report h/o Enbrel - failed Reports h/o of Humira - did well but after stroke was taken off treatment Reports after stroke clear for years without need for medication. Flared last year started [place] but after 1 injection broke out, had swelling in eyes, wheezing, and elevated liver test.    Denies congestive heart failure  Psoriasis is a chronic non-curable, but treatable genetic/hereditary disease that may have other systemic features affecting other organ systems such as joints (Psoriatic Arthritis). It is associated with an increased risk of inflammatory bowel disease, heart disease, non-alcoholic fatty liver disease, and depression.  Treatments include light and laser treatments;  topical medications; and systemic medications including oral and injectables.  Treatment Plan: No side effects on Humira. Continue Humira 80 mg sq q2wks.  Will order Hepatic Function after patient's 4th dose.   Acrochordons (Skin Tags) - Fleshy, skin-colored pedunculated papules - Benign appearing.  - Observe. - If desired, they can be removed with an in office procedure that is not covered by insurance. - Please call the clinic if you notice any new or changing lesions.  Psoriasis  Related Procedures Hepatic Function Panel   Return in about 4 months (around 02/16/2023) for Psoriasis.  Anise Salvo, RMA, am acting as scribe for Armida Sans, MD .  Documentation: I have reviewed the above documentation for accuracy and completeness, and I agree with the above.  Armida Sans, MD

## 2022-10-20 ENCOUNTER — Encounter: Payer: Self-pay | Admitting: Dermatology

## 2022-10-26 ENCOUNTER — Other Ambulatory Visit: Payer: Self-pay

## 2022-10-26 ENCOUNTER — Encounter (HOSPITAL_BASED_OUTPATIENT_CLINIC_OR_DEPARTMENT_OTHER): Payer: Self-pay | Admitting: Rehabilitative and Restorative Service Providers"

## 2022-10-26 ENCOUNTER — Ambulatory Visit (HOSPITAL_BASED_OUTPATIENT_CLINIC_OR_DEPARTMENT_OTHER): Payer: Medicare HMO | Attending: Orthopaedic Surgery | Admitting: Rehabilitative and Restorative Service Providers"

## 2022-10-26 DIAGNOSIS — M5116 Intervertebral disc disorders with radiculopathy, lumbar region: Secondary | ICD-10-CM | POA: Diagnosis not present

## 2022-10-26 DIAGNOSIS — R262 Difficulty in walking, not elsewhere classified: Secondary | ICD-10-CM | POA: Diagnosis not present

## 2022-10-26 DIAGNOSIS — M79604 Pain in right leg: Secondary | ICD-10-CM | POA: Insufficient documentation

## 2022-10-26 DIAGNOSIS — M5459 Other low back pain: Secondary | ICD-10-CM | POA: Diagnosis not present

## 2022-10-26 DIAGNOSIS — M79605 Pain in left leg: Secondary | ICD-10-CM | POA: Insufficient documentation

## 2022-10-26 NOTE — Progress Notes (Signed)
OUTPATIENT PHYSICAL THERAPY EVALUATION   Patient Name: Ashley Lewis MRN: [id number] DOB:Feb 14, 1966, 57 y.o., female Today's Date: 10/26/2022  END OF SESSION:  PT End of Session - 10/26/22 1145     Visit Number 1    Number of Visits 10    Date for PT Re-Evaluation 12/07/22    Authorization Type [employer] MCR    Progress Note Due on Visit 10    PT Start Time 1052    PT Stop Time 1140    PT Time Calculation (min) 48 min    Activity Tolerance Patient tolerated treatment well;No increased pain    Behavior During Therapy [facility] for tasks assessed/performed             Past Medical History:  Diagnosis Date   Anemia    iron defi   Atypical chest pain 08/05/2019   Depression    sees Dr. Leigh Aurora   Eczema    Exertional dyspnea 10/29/2021   GERD (gastroesophageal reflux disease)    Hyperlipidemia    Hypertension    Psoriasis    Stroke [facility]) 2011   left thalamic infarct   Past Surgical History:  Procedure Laterality Date   CERVICAL CERCLAGE     x 2, in 1995, 1997   CHOLECYSTECTOMY N/A 02/03/2015   Procedure: LAPAROSCOPIC CHOLECYSTECTOMY;  Surgeon: Rodman Pickle, MD;  Location: [facility] OR;  Service: General;  Laterality: N/A;   Patient Active Problem List   Diagnosis Date Noted   Hematuria 03/12/2022   Wheezing 03/12/2022   Left low back pain 03/12/2022   Pain of left hip 03/12/2022   Exertional dyspnea 10/29/2021   Atypical chest pain 08/05/2019   RBBB 04/10/2017   Psoriasis    Hyperlipidemia    GERD (gastroesophageal reflux disease)    Eczema    Depression    Anemia    Biliary colic 02/03/2015   Neck pain on right side 09/02/2012   Left arm pain 09/02/2012   Dyslipidemia 09/02/2012   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 Insurance said I have to do 6 PT sessions. Pain shoots down my legs along with groin pain on both sides. I have noticed when I eat beef and cheese, my back pain is worse. MRI ordered but I have to do the PT first. Was given Prednisone in July and it is helping with my back. However, the pain is really still there on my left side so I can't wear certain clothes. When I pass gas, the back pain improves. I have been to all the doctors and all my testing has come back clear.  PERTINENT HISTORY:  Per chart review, Ashley Lewis is a 57 y.o. female presents today with ongoing radiating lower back pain down the right leg.  She states the second and third toes were numb.  She has previously been seen by Dr. De Peru who obtained x-rays.  She does have a history of a stroke in 2011.  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 Yes: NPRS scale: 3/10 Pain location: L flank area Pain description: achey, tender Aggravating factors: wearing certain clothes that touch the area Relieving factors: prednisone; loose clothes  PRECAUTIONS:  None  RED FLAGS: Bowel or bladder incontinence: Yes: with flatulence, there was bowel discharge prior to prednisone tx    WEIGHT BEARING RESTRICTIONS:  No  FALLS:  Has patient fallen in last 6 months? 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 There is no evidence of arthropathy or other focal bone abnormality. Bilateral pelvic phleboliths, and partially calcified uterine fibroids.  No lumbar fx or subluxation noted 04/05/21. CTs clear per chart review of spleen and Urinary Tract.  PATIENT SURVEYS:  FOTO ; done but not able to access results.  COGNITIVE STATUS: Within functional limits for tasks assessed   SENSATION: WFL  POSTURE:  R lateral lean, glute =, iliac crest height =    GAIT: Distance walked: in gym Assistive device utilized: None Level of assistance: Complete Independence Comments: no significant findings   Body Part #1 Lumbar  PALPATION: R lateral lean, glute =, iliac crest height =, R lumbar paraspinals tighter; R glute tighter; T12/L1 Hypmobile; sacrum even but tender to sacral border bil, ASIS even but L more forward,  PROM hips bil WNL without pain but with increased tightness felt with R hip flexion  Lumbar ROM LUMBAR ROM:   Active  A/PROM  eval  Flexion WNL  Extension WNL  Right lateral flexion WNL  Left lateral flexion WNL  Right rotation 50% limited  Left rotation 50% limited   (Blank rows = not tested)  MMT: R LE strength 4-/5; L LE strength 4+/5    SPECIAL TESTS:  Lumbar Straight leg raise test: Negative, Slump test: Negative, and FABER test: Negative   TREATMENT:  DATE: 10/26/22:  Theract: Reviewed eval results and POC, reviewed anatomy, advised pt to let primary MD know about bowel incontinence with flatulence and also with bowel relief, pt has elimination of LBP     PATIENT EDUCATION:  Education  details: see 10/26/22 tx Person educated: Patient Education method: Explanation Education comprehension: verbalized understanding  HOME EXERCISE PROGRAM: Not issued at eval due to time   ASSESSMENT:  CLINICAL IMPRESSION: Patient is a 57 y.o. female who was seen today for physical therapy evaluation and treatment for LBP. Pt demonstrates T12/L1 spinal hypomobility and increased tightness along R lumbar paraspinals and glute even though LBP is primarily L sided. Pt also has postural asymmetry due to tightness and as a possible consequence of history of LBP which has improved since prednisone treatment. Pt demonstrates R lateral lean, glute =, iliac crest height =, R lumbar paraspinals tighter; R glute tighter; T12/L1 Hypmobile; sacrum even but tender to sacral border bil, ASIS even but L more forward,  PROM hips bil WNL without pain but with increased tightness felt with R hip flexion.Pt would benefit from PT for manual therapy and therex to correct asymmetries above and postural education. Pt has had history of bowel incontinence, which has since improved with prednisone. L lumbar pain is primarily lateral to T12-L1. PT encouraged pt to let primary MD know as well the above. Pt does have a history of CVA.   OBJECTIVE IMPAIRMENTS: decreased activity tolerance, decreased mobility, decreased strength, hypomobility, postural dysfunction, and pain.   ACTIVITY LIMITATIONS: carrying, lifting, bending, sitting, standing, transfers, continence, locomotion level, and caring for others  PARTICIPATION LIMITATIONS: meal prep, cleaning, laundry, and community activity  PERSONAL FACTORS: Past/current experiences and 1 comorbidity: history of stroke  are also affecting patient's functional outcome.   REHAB POTENTIAL: Good  CLINICAL DECISION MAKING: Stable/uncomplicated  EVALUATION COMPLEXITY: Low   GOALS: Goals reviewed with patient? Yes  SHORT TERM GOALS: Target date: 11/16/22  Pt will be independent  with HEP to assist with lumbar pain management Baseline: not issued at eval due to time and education rendered Goal status: INITIAL  2.  Pt will report improved LBP x 25% with functional mobility Baseline: 0% Goal status: INITIAL    LONG TERM GOALS: Target date: 12/07/22  Pt will report improved LBP >/=50% to assist with care of daughter Baseline: 0% Goal status: INITIAL  2.  Pt will report ability to perform all ADLS with </=3/10 L LBP Baseline: up to 7/10 Goal status: INITIAL  3. Pt will be able to wear all desired clothes with </=3/10 LBP Baseline: unable Goal status: INITIAL  4.  Pt will demo improved bil lumbar rotation to >/=75% present Baseline:50% present Goal status: INITIAL  5.  Foto score:  Baseline: unable to access Goal status: INITIAL     PLAN:  PT FREQUENCY: 2x/week  PT DURATION: 6 weeks  PLANNED INTERVENTIONS: Therapeutic exercises, Therapeutic activity, Neuromuscular re-education, Patient/Family education, Joint mobilization, Dry Needling, Taping, Ultrasound, Manual therapy, and Re-evaluation.  PLAN FOR NEXT SESSION: issue HEP, look up foto score and make a goal  Luna Fuse, PT, DPT 10/26/22 12:55PM

## 2022-10-28 ENCOUNTER — Encounter (HOSPITAL_BASED_OUTPATIENT_CLINIC_OR_DEPARTMENT_OTHER): Payer: Self-pay | Admitting: Rehabilitative and Restorative Service Providers"

## 2022-10-29 DIAGNOSIS — Z1231 Encounter for screening mammogram for malignant neoplasm of breast: Secondary | ICD-10-CM | POA: Diagnosis not present

## 2022-10-29 LAB — HM MAMMOGRAPHY

## 2022-10-30 ENCOUNTER — Encounter: Payer: Self-pay | Admitting: Family Medicine

## 2022-11-13 ENCOUNTER — Ambulatory Visit (HOSPITAL_BASED_OUTPATIENT_CLINIC_OR_DEPARTMENT_OTHER): Payer: Medicare HMO | Admitting: Physical Therapy

## 2022-11-16 ENCOUNTER — Encounter (HOSPITAL_BASED_OUTPATIENT_CLINIC_OR_DEPARTMENT_OTHER): Payer: Self-pay | Admitting: Physical Therapy

## 2022-11-16 ENCOUNTER — Ambulatory Visit (HOSPITAL_BASED_OUTPATIENT_CLINIC_OR_DEPARTMENT_OTHER): Payer: Medicare HMO | Admitting: Physical Therapy

## 2022-11-16 DIAGNOSIS — M79604 Pain in right leg: Secondary | ICD-10-CM | POA: Diagnosis not present

## 2022-11-16 DIAGNOSIS — M5459 Other low back pain: Secondary | ICD-10-CM | POA: Diagnosis not present

## 2022-11-16 DIAGNOSIS — R262 Difficulty in walking, not elsewhere classified: Secondary | ICD-10-CM

## 2022-11-16 DIAGNOSIS — M79605 Pain in left leg: Secondary | ICD-10-CM | POA: Diagnosis not present

## 2022-11-16 DIAGNOSIS — M5116 Intervertebral disc disorders with radiculopathy, lumbar region: Secondary | ICD-10-CM | POA: Diagnosis not present

## 2022-11-16 NOTE — Therapy (Signed)
**Note Ashley-Identified via Obfuscation** OUTPATIENT PHYSICAL THERAPY EVALUATION   Patient Name: Ashley Lewis MRN: [id number] DOB:Sep 27, 1965, 57 y.o., female Today's Date: 11/16/2022  END OF SESSION:  PT End of Session - 11/16/22 1138     Visit Number 2    Number of Visits 10    Date for PT Re-Evaluation 12/07/22    Authorization Type [employer] MCR    Progress Note Due on Visit 10    PT Start Time 1137    PT Stop Time 1207    PT Time Calculation (min) 30 min    Activity Tolerance Patient tolerated treatment well;No increased pain    Behavior During Therapy [facility] for tasks assessed/performed             Past Medical History:  Diagnosis Date   Anemia    iron defi   Atypical chest pain 08/05/2019   Depression    sees Dr. Leigh Lewis   Eczema    Exertional dyspnea 10/29/2021   GERD (gastroesophageal reflux disease)    Hyperlipidemia    Hypertension    Psoriasis    Stroke [facility]) 2011   left thalamic infarct   Past Surgical History:  Procedure Laterality Date   CERVICAL CERCLAGE     x 2, in 1995, 1997   CHOLECYSTECTOMY N/A 02/03/2015   Procedure: LAPAROSCOPIC CHOLECYSTECTOMY;  Surgeon: Rodman Pickle, MD;  Location: [facility] OR;  Service: General;  Laterality: N/A;   Patient Active Problem List   Diagnosis Date Noted   Hematuria 03/12/2022   Wheezing 03/12/2022   Left low back pain 03/12/2022   Pain of left hip 03/12/2022   Exertional dyspnea 10/29/2021   Atypical chest pain 08/05/2019   RBBB 04/10/2017   Psoriasis    Hyperlipidemia    GERD (gastroesophageal reflux disease)    Eczema    Depression    Anemia    Biliary colic 02/03/2015   Neck pain on right side 09/02/2012   Left arm pain 09/02/2012   Dyslipidemia 09/02/2012   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 Steward Lewis   REFERRING DIAG: M51.16 Intervertebral disc disorders with radiculopathy, lumbar region   Rationale for Evaluation and Treatment: Rehabilitation   THERAPY DIAG:  Other low back pain - Plan: PT plan of care cert/re-cert   ONSET DATE: 2018     SUBJECTIVE:  SUBJECTIVE STATEMENT: Had an episode down the leg on Thurs after eating pastrami.    PERTINENT HISTORY:  Per chart review, Ashley Lewis is a 57 y.o. female presents today with ongoing radiating lower back pain down the right leg.  She states the second and third toes were numb.  She has previously been seen by Dr. De Lewis who obtained x-rays.  She does have a history of a stroke in 2011.  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 Yes: NPRS scale: 3/10 Pain location: L flank area Pain description: achey, tender Aggravating factors: wearing certain clothes that touch the area Relieving factors: prednisone; loose clothes   PRECAUTIONS:  None   RED FLAGS: Bowel or bladder incontinence: Yes: with flatulence, there was bowel discharge prior to prednisone tx                WEIGHT BEARING RESTRICTIONS:  No   FALLS:  Has patient fallen in last 6 months? 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 There is no evidence of arthropathy or other focal bone abnormality. Bilateral pelvic phleboliths, and partially calcified uterine fibroids.   No  lumbar fx or subluxation noted 04/05/21. CTs clear per chart review of spleen and Urinary Tract.   PATIENT SURVEYS:  FOTO: EVAL 46   COGNITIVE STATUS: Within functional limits for tasks assessed        SENSATION: WFL   POSTURE:  R lateral lean, glute =, iliac crest height =       GAIT: Distance walked: in gym Assistive device utilized: None Level of assistance: Complete Independence Comments: no significant findings     Body Part #1 Lumbar   PALPATION: R lateral lean, glute =, iliac crest height =, R lumbar paraspinals tighter; R glute tighter; T12/L1 Hypmobile; sacrum even but tender to sacral border bil, ASIS even but L more forward,  PROM hips bil WNL without pain but with increased tightness felt with R hip flexion   Lumbar ROM LUMBAR ROM:    Active  A/PROM  eval  Flexion WNL  Extension WNL  Right lateral flexion WNL  Left lateral flexion WNL  Right rotation 50% limited  Left rotation 50% limited   (Blank rows = not tested)   MMT: R LE strength 4-/5; L LE strength 4+/5     SPECIAL TESTS:  Lumbar Straight leg raise test: Negative, Slump test: Negative, and FABER test: Negative     TREATMENT:  Treatment                            8/24:  STM Rt gluts Cat/camel Child pose- midline & lateral Edu on self release with tennis ball Seated trunk flexion at table with deep breathing   DATE: 10/26/22:  Theract: Reviewed eval results and POC, reviewed anatomy, advised pt to let primary MD know about bowel incontinence with flatulence and also with bowel relief, pt has elimination of LBP        PATIENT EDUCATION:  Education details: Anatomy of condition, POC, HEP, exercise form/rationale Person educated: Patient Education method: Explanation Education comprehension: verbalized understanding   HOME EXERCISE PROGRAM: [id number]      ASSESSMENT:   CLINICAL IMPRESSION: Concordant distal symptoms with trigger point palpation that was reduced. Pt reported less stiffness following treatment today.  EVAL: Patient is a 57 y.o. female who was seen today for physical therapy evaluation and treatment for LBP. Pt demonstrates T12/L1 spinal hypomobility and increased tightness along R lumbar paraspinals and glute even though LBP is primarily L sided. Pt also has postural asymmetry due to tightness and as a possible consequence of history of LBP which has improved since prednisone treatment. Pt demonstrates R lateral lean, glute =, iliac crest height =, R lumbar paraspinals tighter; R glute tighter; T12/L1 Hypmobile; sacrum even but tender to sacral border bil, ASIS even but L more forward,  PROM hips bil WNL without pain but with increased tightness felt with R hip flexion.Pt would benefit from PT for manual therapy and therex to correct asymmetries above and postural education. Pt has had history of bowel incontinence, which has since improved with prednisone. L lumbar pain is primarily lateral to T12-L1. PT encouraged pt to let primary MD know as well the above. Pt does have a history of CVA.     OBJECTIVE IMPAIRMENTS: decreased activity tolerance, decreased mobility, decreased strength, hypomobility, postural dysfunction, and pain.    ACTIVITY LIMITATIONS: carrying, lifting, bending, sitting, standing, transfers, continence, locomotion level, and caring for others   PARTICIPATION LIMITATIONS: meal prep, cleaning, laundry, and community activity   PERSONAL FACTORS: Past/current experiences and 1 comorbidity: history of stroke  are also affecting patient's functional outcome.    REHAB POTENTIAL: Good   CLINICAL DECISION MAKING: Stable/uncomplicated   EVALUATION COMPLEXITY: Low     GOALS: Goals reviewed with patient? Yes   SHORT TERM GOALS: Target date: 11/16/22   Pt will be independent with HEP to assist with lumbar pain  management Baseline: not issued at eval due to time and education rendered Goal status: INITIAL   2.  Pt will report improved LBP x 25% with functional mobility Baseline: 0% Goal status: INITIAL       LONG TERM GOALS: Target date: 12/07/22   Pt will report improved LBP >/=50% to assist with care of daughter Baseline: 0% Goal status: INITIAL   2.  Pt will report ability to perform all ADLS with </=3/10 L LBP Baseline: up to 7/10 Goal status: INITIAL   3. Pt will be able to wear all desired clothes with </=3/10 LBP Baseline: unable Goal status: INITIAL   4.  Pt will demo improved bil lumbar rotation to >/=75% present Baseline:50% present Goal status: INITIAL   5.  Foto score:  Baseline: unable to access Goal status: INITIAL         PLAN:   PT FREQUENCY: 2x/week   PT DURATION: 6 weeks   PLANNED  INTERVENTIONS: Therapeutic exercises, Therapeutic activity, Neuromuscular re-education, Patient/Family education, Joint mobilization, Dry Needling, Taping, Ultrasound, Manual therapy, and Re-evaluation.   PLAN FOR NEXT SESSION: consider DN, lumbar mobility, general core stabilization   Sherie Dobrowolski C. Darsi Tien PT, DPT 11/16/22 12:19 PM

## 2022-11-18 NOTE — Progress Notes (Deleted)
Subjective:   Ashley Lewis 05/07/1965 11/19/2022  No chief complaint on file.   HPI: Ashley Lewis presents today for re-assessment and management of chronic medical conditions.  HYPERTENSION: Ashley Lewis presents for the medical management of hypertension.  Patient's current hypertension medication regimen is: *** Patient is *** currently taking prescribed medications for HTN.  Patient is *** regularly keeping a check on BP at home.  Adhering to low sodium diet: *** Exercising Regularly: *** Denies headache, dizziness, CP, SHOB, vision changes.   BP Readings from Last 3 Encounters:  08/22/22 (!) 140/81  08/13/22 134/85  07/01/22 134/85    DIABETES MELLITUS: Ashley Lewis presents for the medical management of diabetes.  Current diabetes medication regimen: *** Patient is *** adhering to a diabetic diet.  Patient is *** exercising regularly.  Patient is *** checking BS regularly. Avg: *** Patient is *** checking their feet regularly.  Denies polydipsia, polyphagia, polyuria, open wounds or ulcers on feet.  Lab Results  Component Value Date   HGBA1C  06/14/2009    5.5 (NOTE) The ADA recommends the following therapeutic goal for glycemic control related to Hgb A1c measurement: Goal of therapy: <6.5 Hgb A1c  Reference: American Diabetes Association: Clinical Practice Recommendations 2010, Diabetes Care, 2010, 33: (Suppl  1).    No foot exam found Lab Results  Component Value Date   LABMICR Comment 03/12/2022    Wt Readings from Last 3 Encounters:  08/22/22 203 lb (92.1 kg)  08/13/22 196 lb (88.9 kg)  06/11/22 201 lb 1.6 oz (91.2 kg)    The following portions of the patient's history were reviewed and updated as appropriate: past medical history, past surgical history, family history, social history, allergies, medications, and problem list.   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 Leigh Aurora   Eczema    Exertional dyspnea 10/29/2021   GERD (gastroesophageal reflux disease)    Hyperlipidemia    Hypertension    Psoriasis    Stroke [facility]) 2011   left thalamic infarct   Past Surgical History:  Procedure Laterality Date   CERVICAL CERCLAGE     x 2, in 1995, 1997   CHOLECYSTECTOMY N/A 02/03/2015   Procedure: LAPAROSCOPIC CHOLECYSTECTOMY;  Surgeon: De Blanch Kinsinger, MD;  Location: [facility] OR;  Service: General;  Laterality: N/A;   Family History  Problem Relation Age of Onset   Cancer Mother 76       breast cancer x 2  Cancer Father 3       pancreatic   Cancer Maternal Grandmother    Heart failure Maternal Grandfather    Outpatient Medications Prior to Visit  Medication Sig Dispense Refill   methylPREDNISolone (MEDROL DOSEPAK) 4 MG TBPK tablet Take per packet instructions 1 each 0   Adalimumab (HUMIRA, 2 PEN,) 80 MG/0.8ML PNKT Inject 80 mg into the skin every 14 (fourteen) days. Starting on day 29. 2 each 5   Adalimumab (HUMIRA-CD/UC/HS STARTER) 80 MG/0.8ML PNKT Inject 160 mg into the skin as directed. Inject 160  mg subcutaneous on day 1, then 80 mg subcutaneous on day 15. (Patient not taking: Reported on 08/13/2022) 1 each 0   albuterol (VENTOLIN HFA) 108 (90 Base) MCG/ACT inhaler Inhale into the lungs. (Patient not taking: Reported on 10/26/2022)     amLODipine (NORVASC) 5 MG tablet Take 1 tablet (5 mg total) by mouth daily. 90 tablet 1   aspirin EC 81 MG tablet Take 81 mg by mouth daily.     Evolocumab (REPATHA SURECLICK) 140 MG/ML SOAJ INJECT 140 MG INTO THE SKIN EVERY 14 (FOURTEEN) DAYS. 6 mL 2   fluticasone (FLONASE) 50 MCG/ACT nasal spray Place 1 spray into both nostrils as needed. (Patient not taking: Reported on 08/13/2022)     ibuprofen (ADVIL) 800 MG tablet Take 800 mg by mouth every 8 (eight) hours as needed (pain). (Patient not taking: Reported on 10/26/2022)     lidocaine (XYLOCAINE) 2 % solution 5 mLs 3 (three) times daily as needed. (Patient not taking: Reported on 10/26/2022)     omeprazole (PRILOSEC) 40 MG capsule Take 40 mg by mouth daily.     valsartan (DIOVAN) 80 MG tablet Take 1 tablet (80 mg total) by mouth daily. 90 tablet 1   No facility-administered medications prior to visit.   Allergies  Allergen Reactions   Lisinopril Cough and Other (See Comments)    Other Reaction(s): Cough (ALLERGY/intolerance)  Drip in her throat, coughing, and couldn't breathe   Risankizumab Rash, Swelling and Anaphylaxis     ROS: A complete ROS was performed with pertinent positives/negatives noted in the HPI. The remainder of the ROS are negative.    Objective:   There were no vitals filed for this visit.  Physical Exam          GENERAL: Well-appearing, in NAD. Well nourished.  SKIN: Pink, warm and dry. No rash, lesion, ulceration, or ecchymoses.  Head: Normocephalic. NECK: Trachea midline. Full ROM w/o pain or tenderness. No lymphadenopathy.  EARS: Tympanic membranes are intact, translucent without bulging and without drainage. Appropriate landmarks visualized.  EYES: Conjunctiva clear  without exudates. EOMI, PERRL, no drainage present.  NOSE: Septum midline w/o deformity. Nares patent, mucosa pink and non-inflamed w/o drainage. No sinus tenderness.  THROAT: Uvula midline. Oropharynx clear. Tonsils non-inflamed without exudate. Mucous membranes pink and moist.  RESPIRATORY: Chest wall symmetrical. Respirations even and non-labored. Breath sounds clear to auscultation bilaterally.  CARDIAC: S1, S2 present, regular rate and rhythm without murmur or gallops. Peripheral pulses 2+ bilaterally.  MSK: Muscle tone and strength appropriate for age. Joints w/o tenderness, redness, or swelling.  EXTREMITIES: Without clubbing, cyanosis, or edema.  NEUROLOGIC: No motor or sensory deficits. Steady, even gait. C2-C12 intact.  PSYCH/MENTAL STATUS: Alert, oriented x 3. Cooperative, appropriate mood and affect.   Health Maintenance Due  Topic Date Due   PAP SMEAR-Modifier  06/21/2019   DTaP/Tdap/Td (2 - Tdap) 11/17/2019   INFLUENZA VACCINE  10/24/2022    No results found  for any visits on 11/19/22.  The ASCVD Risk score (Arnett DK, et al., 2019) failed to calculate for the following reasons:   The patient has a prior MI or stroke diagnosis     Assessment & Plan:  *** There are no diagnoses linked to this encounter.  No orders of the defined types were placed in this encounter.  Lab Orders  No laboratory test(s) ordered today   No images are attached to the encounter or orders placed in the encounter.  No follow-ups on file.    Patient to reach out to office if new, worrisome, or unresolved symptoms arise or if no improvement in patient's condition. Patient verbalized understanding and is agreeable to treatment plan. All questions answered to patient's satisfaction.   Of note, portions of this note may have been created with voice recognition software [employer]ic[employer], medical). While this note has been edited for accuracy, occasional wrong-word or 'sound-a-like' substitutions may have  occurred due to the inherent limitations of voice recognition software.  Yolanda Manges, FNP

## 2022-11-19 ENCOUNTER — Ambulatory Visit (HOSPITAL_BASED_OUTPATIENT_CLINIC_OR_DEPARTMENT_OTHER): Payer: Medicare HMO | Admitting: Family Medicine

## 2022-11-20 ENCOUNTER — Ambulatory Visit (HOSPITAL_BASED_OUTPATIENT_CLINIC_OR_DEPARTMENT_OTHER): Payer: Medicare HMO | Admitting: Physical Therapy

## 2022-11-23 ENCOUNTER — Ambulatory Visit (HOSPITAL_BASED_OUTPATIENT_CLINIC_OR_DEPARTMENT_OTHER): Payer: Medicare HMO | Admitting: Physical Therapy

## 2022-11-23 ENCOUNTER — Encounter (HOSPITAL_BASED_OUTPATIENT_CLINIC_OR_DEPARTMENT_OTHER): Payer: Self-pay | Admitting: Physical Therapy

## 2022-11-23 DIAGNOSIS — R262 Difficulty in walking, not elsewhere classified: Secondary | ICD-10-CM | POA: Diagnosis not present

## 2022-11-23 DIAGNOSIS — M79605 Pain in left leg: Secondary | ICD-10-CM

## 2022-11-23 DIAGNOSIS — M79604 Pain in right leg: Secondary | ICD-10-CM | POA: Diagnosis not present

## 2022-11-23 DIAGNOSIS — M5459 Other low back pain: Secondary | ICD-10-CM

## 2022-11-23 DIAGNOSIS — M5116 Intervertebral disc disorders with radiculopathy, lumbar region: Secondary | ICD-10-CM | POA: Diagnosis not present

## 2022-11-23 NOTE — Therapy (Signed)
OUTPATIENT PHYSICAL THERAPY EVALUATION   Patient Name: Ashley Lewis MRN: [id number] DOB:1966/01/09, 57 y.o., female Today's Date: 11/23/2022  END OF SESSION:  PT End of Session - 11/23/22 1135     Visit Number 3    Number of Visits 10    Date for PT Re-Evaluation 12/07/22    Authorization Type [employer] MCR    Progress Note Due on Visit 10    PT Start Time 1134    PT Stop Time 1215    PT Time Calculation (min) 41 min    Activity Tolerance Patient tolerated treatment well;No increased pain    Behavior During Therapy [facility] for tasks assessed/performed              Past Medical History:  Diagnosis Date   Anemia    iron defi   Atypical chest pain 08/05/2019   Depression    sees Dr. Leigh Aurora   Eczema    Exertional dyspnea 10/29/2021   GERD (gastroesophageal reflux disease)    Hyperlipidemia    Hypertension    Psoriasis    Stroke [facility]) 2011   left thalamic infarct   Past Surgical History:  Procedure Laterality Date   CERVICAL CERCLAGE     x 2, in 1995, 1997   CHOLECYSTECTOMY N/A 02/03/2015   Procedure: LAPAROSCOPIC CHOLECYSTECTOMY;  Surgeon: Rodman Pickle, MD;  Location: [facility] OR;  Service: General;  Laterality: N/A;   Patient Active Problem List   Diagnosis Date Noted   Hematuria 03/12/2022   Wheezing 03/12/2022   Left low back pain 03/12/2022   Pain of left hip 03/12/2022   Exertional dyspnea 10/29/2021   Atypical chest pain 08/05/2019   RBBB 04/10/2017   Psoriasis    Hyperlipidemia    GERD (gastroesophageal reflux disease)    Eczema    Depression    Anemia    Biliary colic 02/03/2015   Neck pain on right side 09/02/2012   Left arm pain 09/02/2012   Dyslipidemia 09/02/2012   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 Steward Drone   REFERRING DIAG: M51.16 Intervertebral disc disorders with radiculopathy, lumbar region   Rationale for Evaluation and Treatment: Rehabilitation   THERAPY DIAG:  Other low back pain - Plan: PT plan of care cert/re-cert   ONSET DATE: 2018     SUBJECTIVE:  SUBJECTIVE STATEMENT: Had an episode on Thursday, insidious onset, could feel it but it wasn't bad.    PERTINENT HISTORY:  Per chart review, Ashley Lewis is a 57 y.o. female presents today with ongoing radiating lower back pain down the right leg.  She states the second and third toes were numb.  She has previously been seen by Dr. De Peru who obtained x-rays.  She does have a history of a stroke in 2011.  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 Yes: NPRS scale: 3/10 Pain location: L flank area Pain description: achey, tender Aggravating factors: wearing certain clothes that touch the area Relieving factors: prednisone; loose clothes   PRECAUTIONS:  None   RED FLAGS: Bowel or bladder incontinence: Yes: with flatulence, there was bowel discharge prior to prednisone tx                WEIGHT BEARING RESTRICTIONS:  No   FALLS:  Has patient fallen in last 6 months? 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 There is no evidence of arthropathy or other focal bone abnormality. Bilateral pelvic phleboliths, and partially calcified uterine  fibroids.   No lumbar fx or subluxation noted 04/05/21. CTs clear per chart review of spleen and Urinary Tract.   PATIENT SURVEYS:  FOTO: EVAL 46   COGNITIVE STATUS: Within functional limits for tasks assessed        SENSATION: WFL   POSTURE:  R lateral lean, glute =, iliac crest height = 8/31: supine - Lt post innom & Rt ant rotation       GAIT: Distance walked: in gym Assistive device utilized: None Level of assistance: Complete Independence Comments: no significant findings     Body Part #1 Lumbar   PALPATION: R lateral lean, glute =, iliac crest height =, R lumbar paraspinals tighter; R glute tighter; T12/L1 Hypmobile; sacrum even but tender to sacral border bil, ASIS even but L more forward,  PROM hips bil WNL without pain but with increased tightness felt with R hip flexion   Lumbar ROM LUMBAR ROM:    Active  A/PROM  eval  Flexion WNL  Extension WNL  Right lateral flexion WNL  Left lateral flexion WNL  Right rotation 50% limited  Left rotation 50% limited   (Blank rows = not tested)   MMT: R LE strength 4-/5; L LE strength 4+/5     SPECIAL TESTS:  Lumbar Straight leg raise test: Negative, Slump test: Negative, and FABER test: Negative     TREATMENT:  Treatment                            8/31:  Dowel isometric SIJ correction- Lt flexors/Rt extensors Ball add 5x5s Pectineus STM Butterfly stretch LTR Bridge in DF, ball bw knees, arms reaching up Plank knees/elbows Seated, resting posture with ball bw knees   Treatment                            8/24:  STM Rt gluts Cat/camel Child pose- midline & lateral Edu on self release with tennis ball Seated trunk flexion at table with deep breathing   DATE: 10/26/22:  Theract: Reviewed eval results and POC, reviewed anatomy, advised pt to let primary MD know about bowel  incontinence with flatulence and also with bowel relief, pt has elimination of LBP        PATIENT EDUCATION:  Education details: Anatomy of condition, POC, HEP, exercise form/rationale Person educated: Patient Education method: Explanation Education comprehension: verbalized understanding   HOME EXERCISE PROGRAM: [id number]     ASSESSMENT:   CLINICAL IMPRESSION: Able to correct innominate rotation and requested that she continue to focus on activation of core and hold somehting bw her knees in a seated position for equal Rt/Lt activation.   EVAL: Patient is a 57 y.o. female who was seen today for physical therapy evaluation and treatment for LBP. Pt demonstrates T12/L1 spinal hypomobility and increased tightness along R lumbar paraspinals and glute even though LBP is primarily L sided. Pt also has postural asymmetry due to tightness and as a possible consequence of history of LBP which has improved since prednisone treatment. Pt demonstrates R lateral lean, glute =, iliac crest height =, R lumbar paraspinals tighter; R glute tighter; T12/L1 Hypmobile; sacrum even but tender to sacral border bil, ASIS even but L more forward,  PROM hips bil WNL without pain but with increased tightness felt with R hip flexion.Pt would benefit from PT for manual therapy and therex to correct asymmetries above and postural education. Pt has had history of bowel incontinence, which has since improved with prednisone. L lumbar pain is primarily lateral to T12-L1. PT encouraged pt to let primary MD know as well the above. Pt does have a history of CVA.     OBJECTIVE IMPAIRMENTS: decreased activity tolerance, decreased mobility, decreased strength, hypomobility, postural dysfunction, and pain.    ACTIVITY LIMITATIONS: carrying, lifting, bending, sitting, standing, transfers, continence, locomotion level, and caring for others   PARTICIPATION LIMITATIONS: meal prep, cleaning, laundry, and community activity    PERSONAL FACTORS: Past/current experiences and 1 comorbidity: history of stroke  are also affecting patient's functional outcome.    REHAB POTENTIAL: Good   CLINICAL DECISION MAKING: Stable/uncomplicated   EVALUATION COMPLEXITY: Low     GOALS: Goals reviewed with patient? Yes   SHORT TERM GOALS: Target date: 11/16/22   Pt will be independent with HEP to assist with lumbar pain management Baseline: not issued at eval due to time and education rendered Goal status: INITIAL   2.  Pt will report improved LBP x 25% with functional mobility Baseline: 0% Goal status: INITIAL       LONG TERM GOALS: Target date: 12/07/22   Pt will report improved LBP >/=50% to assist with care of daughter Baseline: 0% Goal status: INITIAL   2.  Pt will report ability to perform all ADLS with </=3/10 L LBP Baseline: up to 7/10  Goal status: INITIAL   3. Pt will be able to wear all desired clothes with </=3/10 LBP Baseline: unable Goal status: INITIAL   4.  Pt will demo improved bil lumbar rotation to >/=75% present Baseline:50% present Goal status: INITIAL   5.  Foto score:  Baseline: unable to access Goal status: INITIAL         PLAN:   PT FREQUENCY: 2x/week   PT DURATION: 6 weeks   PLANNED INTERVENTIONS: Therapeutic exercises, Therapeutic activity, Neuromuscular re-education, Patient/Family education, Joint mobilization, Dry Needling, Taping, Ultrasound, Manual therapy, and Re-evaluation.   PLAN FOR NEXT SESSION: consider DN, lumbar mobility, general core stabilization   Kamareon Sciandra C. Bernadette Gores PT, DPT 11/23/22 12:15 PM

## 2022-11-26 ENCOUNTER — Ambulatory Visit (HOSPITAL_BASED_OUTPATIENT_CLINIC_OR_DEPARTMENT_OTHER): Payer: Medicare HMO | Admitting: Physical Therapy

## 2022-11-27 ENCOUNTER — Ambulatory Visit (HOSPITAL_BASED_OUTPATIENT_CLINIC_OR_DEPARTMENT_OTHER): Payer: Medicare HMO | Admitting: Orthopaedic Surgery

## 2022-11-28 ENCOUNTER — Ambulatory Visit (HOSPITAL_BASED_OUTPATIENT_CLINIC_OR_DEPARTMENT_OTHER): Payer: Medicare HMO | Attending: Orthopaedic Surgery | Admitting: Physical Therapy

## 2022-11-28 DIAGNOSIS — M79604 Pain in right leg: Secondary | ICD-10-CM | POA: Insufficient documentation

## 2022-11-28 DIAGNOSIS — R262 Difficulty in walking, not elsewhere classified: Secondary | ICD-10-CM | POA: Insufficient documentation

## 2022-11-28 DIAGNOSIS — M5459 Other low back pain: Secondary | ICD-10-CM | POA: Insufficient documentation

## 2022-11-28 DIAGNOSIS — M79605 Pain in left leg: Secondary | ICD-10-CM | POA: Diagnosis not present

## 2022-11-28 NOTE — Therapy (Signed)
**Note Ashley-Identified via Obfuscation** OUTPATIENT PHYSICAL THERAPY TREATMENT NOTE   Patient Name: Ashley Lewis MRN: [id number] DOB:[birth year], 57 y.o., female Today's Date: 11/29/2022  END OF SESSION:  PT End of Session - 11/28/22 1409     Visit Number 4    Number of Visits 10    Date for PT Re-Evaluation 12/07/22    Authorization Type [employer]et[employer] MCR    PT Start Time 1320    PT Stop Time 1359    PT Time Calculation (min) 39 min    Activity Tolerance Patient tolerated treatment well;No increased pain    Behavior During Therapy [facility] for tasks assessed/performed               Past Medical History:  Diagnosis Date   Anemia    iron defi   Atypical chest pain 08/05/2019   Depression    sees Dr. Leigh Lewis   Eczema    Exertional dyspnea 10/29/2021   GERD (gastroesophageal reflux disease)    Hyperlipidemia    Hypertension    Psoriasis    Stroke [facility]) 2011   left thalamic infarct   Past Surgical History:  Procedure Laterality Date   CERVICAL CERCLAGE     x 2, in 1995, 1997   CHOLECYSTECTOMY N/A 02/03/2015   Procedure: LAPAROSCOPIC CHOLECYSTECTOMY;  Surgeon: Ashley Pickle, MD;  Location: [facility] OR;  Service: General;  Laterality: N/A;   Patient Active Problem List   Diagnosis Date Noted   Hematuria 03/12/2022   Wheezing 03/12/2022   Left low back pain 03/12/2022   Pain of left hip 03/12/2022   Exertional dyspnea 10/29/2021   Atypical chest pain 08/05/2019   RBBB 04/10/2017   Psoriasis    Hyperlipidemia    GERD (gastroesophageal reflux disease)    Eczema    Depression    Anemia    Biliary colic 02/03/2015   Neck pain on right side 09/02/2012   Left arm pain 09/02/2012   Dyslipidemia 09/02/2012   Pseudobulbar affect 08/12/2012   Amenorrhea 08/12/2012   Essential hypertension, benign 08/12/2012   History of CVA (cerebrovascular accident) 07/06/2009   MOOD SWINGS 05/09/2009   DEPRESSION, MODERATE, RECURRENT 10/05/2008   ADJ DISORDER WITH MIXED ANXIETY & DEPRESSED MOOD 08/30/2008   OVERWEIGHT  02/11/2007   ANEMIA, IRON DEFICIENCY 02/11/2007    REFERRING PROVIDER: Dr. 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  Pt reports she has numbness in R sided toes 2,3.  Pt denies any adverse effects after prior Rx.  Pt states she did a few of her home exercises.  Pt has difficulty with donning socks and pants.    PERTINENT HISTORY:  Per chart review, Ashley Lewis is a 57 y.o. female presents today with ongoing radiating lower back pain down the right leg.  She states the second and third toes were numb.  She has previously been seen by Dr. De Lewis who obtained x-rays.  She does have a history of a stroke in 2011.  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 Yes: NPRS scale: 3/10 Pain location: central and L flank area Pain description: achey, tender Aggravating factors: wearing certain clothes that touch the area Relieving factors: prednisone; loose clothes   PRECAUTIONS:  None   RED FLAGS: Bowel or bladder incontinence: Yes: with flatulence, there was bowel discharge prior to prednisone tx                WEIGHT BEARING RESTRICTIONS:  No   FALLS:  Has patient fallen in last 6 months? 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 There is  no evidence of arthropathy or other focal bone abnormality. Bilateral pelvic phleboliths, and partially calcified uterine fibroids.   No lumbar fx or subluxation noted 04/05/21. CTs clear per chart review of spleen and Urinary Tract.      TREATMENT:                                                                                                                                Reviewed response to prior Rx, HEP compliance, and pain levels. Pt performed:  Supine lumbar rotation 2x10  Bridge with ball b/w knees, arm reaching up   Supine TrA contraction with and without 5 sec hold  Supine clams with RTB with TrA 2x10  Supine marching with TrA x 10 reps  Dowel isometric SIJ correction- Lt flexors/Rt extensors  Cat/camel      PATIENT EDUCATION:  Education details: TrA contractions, relevant anatomy, POC, HEP, exercise form/rationale Person educated: Patient Education method: Explanation, demonstration, verbal cues Education comprehension: verbalized understanding, returned demonstration, verbal cues required   HOME EXERCISE PROGRAM: [id number]     ASSESSMENT:   CLINICAL IMPRESSION: Pt requires cuing and instruction for correct form and positioning with exercises.  PT instructed pt in TrA contractions and she improved with form with increased reps and instruction.  Pt presented to Rx reporting continued stiffness and reports improved stiffness after  Rx.  She responded well to Rx reporting improved pain from 3/10 before Rx to 1/10 after Rx.   OBJECTIVE IMPAIRMENTS: decreased activity tolerance, decreased mobility, decreased strength, hypomobility, postural dysfunction, and pain.    ACTIVITY LIMITATIONS: carrying, lifting, bending, sitting, standing, transfers, continence, locomotion level, and caring for others   PARTICIPATION LIMITATIONS: meal prep, cleaning, laundry, and community activity   PERSONAL FACTORS: Past/current experiences and 1 comorbidity: history of stroke  are also  affecting patient's functional outcome.    REHAB POTENTIAL: Good   CLINICAL DECISION MAKING: Stable/uncomplicated   EVALUATION COMPLEXITY: Low     GOALS: Goals reviewed with patient? Yes   SHORT TERM GOALS: Target date: 11/16/22   Pt will be independent with HEP to assist with lumbar pain management Baseline: not issued at eval due to time and education rendered Goal status: INITIAL   2.  Pt will report improved LBP x 25% with functional mobility Baseline: 0% Goal status: INITIAL       LONG TERM GOALS: Target date: 12/07/22   Pt will report improved LBP >/=50% to assist with care of daughter Baseline: 0% Goal status: INITIAL   2.  Pt will report ability to perform all ADLS with </=3/10 L LBP Baseline: up to 7/10 Goal status: INITIAL   3. Pt will be able to wear all desired clothes with </=3/10 LBP Baseline: unable Goal status: INITIAL   4.  Pt will demo improved bil lumbar rotation to >/=75% present Baseline:50% present Goal status: INITIAL   5.  Foto score:  Baseline: unable to access Goal status: INITIAL         PLAN:   PT FREQUENCY: 2x/week   PT DURATION: 6 weeks   PLANNED INTERVENTIONS: Therapeutic exercises, Therapeutic activity, Neuromuscular re-education, Patient/Family education, Joint mobilization, Dry Needling, Taping, Ultrasound, Manual therapy, and Re-evaluation.   PLAN FOR NEXT SESSION: consider DN, lumbar mobility, general core stabilization   Audie Clear III PT, DPT 11/29/22 10:50 AM

## 2022-11-29 ENCOUNTER — Encounter (HOSPITAL_BASED_OUTPATIENT_CLINIC_OR_DEPARTMENT_OTHER): Payer: Self-pay | Admitting: Physical Therapy

## 2022-12-02 ENCOUNTER — Ambulatory Visit (INDEPENDENT_AMBULATORY_CARE_PROVIDER_SITE_OTHER): Payer: Medicare HMO | Admitting: Family Medicine

## 2022-12-02 VITALS — BP 137/81 | HR 73 | Ht 63.0 in | Wt 201.3 lb

## 2022-12-02 DIAGNOSIS — L409 Psoriasis, unspecified: Secondary | ICD-10-CM | POA: Diagnosis not present

## 2022-12-02 DIAGNOSIS — I10 Essential (primary) hypertension: Secondary | ICD-10-CM

## 2022-12-02 DIAGNOSIS — E785 Hyperlipidemia, unspecified: Secondary | ICD-10-CM | POA: Diagnosis not present

## 2022-12-02 NOTE — Progress Notes (Signed)
Subjective:   Ashley Lewis 08-27-1965 12/02/2022  Chief Complaint  Patient presents with   Medical Management of Chronic Issues    74-month follow up; denies any concerns for today's visit.    HPI: Ashley Lewis presents today for re-assessment and management of chronic medical conditions.  HYPERTENSION: Memory Dance presents for the medical management of hypertension.  Patient's current hypertension medication regimen is: Amlodipine 5mg , Valsartan 80mg   Patient is  currently taking prescribed medications for HTN.  Patient is  regularly keeping a check on BP at home. Systolic Bp 130's and Diastolic 80-90's.  Adhering to low sodium diet: Yes Exercising Regularly: No, currently in physical therapy  Denies headache, dizziness, CP, SHOB, vision changes.   BP Readings from Last 3 Encounters:  12/02/22 137/81  08/22/22 (!) 140/81  08/13/22 134/85   HYPERLIPIDEMIA: Memory Dance presents for the medical management of hyperlipidemia.  Patient's current HLD regimen is: Repatha Q14 days by Cardiology  Patient is  currently taking prescribed medications for HLD.  Adhering to heathy diet: Yes Exercising regularly: yes Denies myalgias.  Lab Results  Component Value Date   CHOL 137 04/08/2022   HDL 46 04/08/2022   LDLCALC 70 04/08/2022   TRIG 119 04/08/2022   CHOLHDL 3.0 04/08/2022      The following portions of the patient's history were reviewed and updated as appropriate: past medical history, past surgical history, family history, social history, allergies, medications, and problem list.   Patient Active Problem List   Diagnosis Date Noted   Hematuria 03/12/2022   Wheezing 03/12/2022   Left low back pain 03/12/2022   Pain of left hip 03/12/2022   Exertional dyspnea 10/29/2021   Atypical chest pain 08/05/2019   RBBB 04/10/2017   Psoriasis    Hyperlipidemia    GERD (gastroesophageal reflux disease)    Eczema    Depression    Anemia     Biliary colic 02/03/2015   Neck pain on right side 09/02/2012   Left arm pain 09/02/2012   Dyslipidemia 09/02/2012   Pseudobulbar affect 08/12/2012   Amenorrhea 08/12/2012   Essential hypertension, benign 08/12/2012   History of CVA (cerebrovascular accident) 07/06/2009   MOOD SWINGS 05/09/2009   DEPRESSION, MODERATE, RECURRENT 10/05/2008   ADJ DISORDER WITH MIXED ANXIETY & DEPRESSED MOOD 08/30/2008   OVERWEIGHT 02/11/2007   ANEMIA, IRON DEFICIENCY 02/11/2007   Past Medical History:  Diagnosis Date   Anemia    iron defi   Atypical chest pain 08/05/2019   Depression    sees Dr. Leigh Aurora   Eczema    Exertional dyspnea 10/29/2021   GERD (gastroesophageal reflux disease)    Hyperlipidemia    Hypertension    Psoriasis    Stroke [facility]) 2011   left thalamic infarct   Past Surgical History:  Procedure Laterality Date   CERVICAL CERCLAGE     x 2, in 1995, 1997   CHOLECYSTECTOMY N/A 02/03/2015   Procedure: LAPAROSCOPIC CHOLECYSTECTOMY;  Surgeon: De Blanch Kinsinger, MD;  Location: [facility] OR;  Service: General;  Laterality: N/A;   Family History  Problem Relation Age of Onset   Cancer Mother 32       breast cancer x 2   Cancer Father 73       pancreatic   Cancer Maternal Grandmother    Heart failure Maternal Grandfather    Outpatient Medications Prior to Visit  Medication Sig Dispense Refill   Adalimumab (HUMIRA, 2 PEN,) 80 MG/0.8ML PNKT Inject 80 mg  into the skin every 14 (fourteen) days. Starting on day 29. 2 each 5   amLODipine (NORVASC) 5 MG tablet Take 1 tablet (5 mg total) by mouth daily. 90 tablet 1   aspirin EC 81 MG tablet Take 81 mg by mouth daily.     Evolocumab (REPATHA SURECLICK) 140 MG/ML SOAJ INJECT 140 MG INTO THE SKIN EVERY 14 (FOURTEEN) DAYS. 6 mL 2   omeprazole (PRILOSEC) 40 MG capsule Take 40 mg by mouth daily.     valsartan (DIOVAN) 80 MG tablet Take 1 tablet (80 mg total) by mouth daily. 90 tablet 1   Adalimumab (HUMIRA-CD/UC/HS STARTER) 80 MG/0.8ML PNKT Inject  160 mg into the skin as directed. Inject 160 mg subcutaneous on day 1, then 80 mg subcutaneous on day 15. (Patient not taking: Reported on 08/13/2022) 1 each 0   albuterol (VENTOLIN HFA) 108 (90 Base) MCG/ACT inhaler Inhale into the lungs. (Patient not taking: Reported on 10/26/2022)     fluticasone (FLONASE) 50 MCG/ACT nasal spray Place 1 spray into both nostrils as needed. (Patient not taking: Reported on 08/13/2022)     ibuprofen (ADVIL) 800 MG tablet Take 800 mg by mouth every 8 (eight) hours as needed (pain). (Patient not taking: Reported on 10/26/2022)     lidocaine (XYLOCAINE) 2 % solution 5 mLs 3 (three) times daily as needed. (Patient not taking: Reported on 10/26/2022)     methylPREDNISolone (MEDROL DOSEPAK) 4 MG TBPK tablet Take per packet instructions 1 each 0   No facility-administered medications prior to visit.   Allergies  Allergen Reactions   Lisinopril Cough and Other (See Comments)    Other Reaction(s): Cough (ALLERGY/intolerance)  Drip in her throat, coughing, and couldn't breathe   Risankizumab Rash, Swelling and Anaphylaxis     ROS: A complete ROS was performed with pertinent positives/negatives noted in the HPI. The remainder of the ROS are negative.    Objective:   Today's Vitals   12/02/22 1345  BP: 137/81  Pulse: 73  SpO2: 100%  Weight: 201 lb 4.8 oz (91.3 kg)  Height: 5\' 3"  (1.6 m)    Physical Exam          GENERAL: Well-appearing, in NAD. Well nourished.  SKIN: Pink, warm and dry. No rash, lesion, ulceration, or ecchymoses.  Head: Normocephalic. NECK: Trachea midline. Full ROM w/o pain or tenderness.  THROAT: Uvula midline. Oropharynx clear. Tonsils non-inflamed without exudate. Mucous membranes pink and moist.  RESPIRATORY: Chest wall symmetrical. Respirations even and non-labored. Breath sounds clear to auscultation bilaterally.  CARDIAC: S1, S2 present, regular rate and rhythm without murmur or gallops. Peripheral pulses 2+ bilaterally. Carotid  arteries without bruit or thrill.  MSK: Muscle tone and strength appropriate for age.  EXTREMITIES: Without clubbing, cyanosis, or edema.  NEUROLOGIC: No motor or sensory deficits. Steady, even gait. C2-C12 intact.  PSYCH/MENTAL STATUS: Alert, oriented x 3. Cooperative, appropriate mood and affect.   No results found for any visits on 12/02/22.  The ASCVD Risk score (Arnett DK, et al., 2019) failed to calculate for the following reasons:   The patient has a prior MI or stroke diagnosis     Assessment & Plan:  1. Essential hypertension, benign Stable and well controlled currently. Continue on Valsartan and amlodipine as prescribed. Continue to monitor BP regularly. Monitor salt intake and perform regular exercise. Follow up in 6 months or sooner if needed.  - Basic metabolic panel - CBC with Differential/Platelet  2. Hyperlipidemia, unspecified hyperlipidemia type Obtain lipid panel with blood  draw today. Continue Repatha therapy managed by Cardiology. Has Cardiology follow up in march 2025. Discussed dietary recommendations.  - Lipid panel  Lab Orders         Basic metabolic panel         CBC with Differential/Platelet         Lipid panel     Return in about 6 months (around 06/01/2023) for Follow up HTN, HLD.    Patient to reach out to office if new, worrisome, or unresolved symptoms arise or if no improvement in patient's condition. Patient verbalized understanding and is agreeable to treatment plan. All questions answered to patient's satisfaction.   Of note, portions of this note may have been created with voice recognition software [employer], medical). While this note has been edited for accuracy, occasional wrong-word or 'sound-a-like' substitutions may have occurred due to the inherent limitations of voice recognition software.  Yolanda Manges, FNP

## 2022-12-03 ENCOUNTER — Telehealth: Payer: Self-pay

## 2022-12-03 ENCOUNTER — Ambulatory Visit (HOSPITAL_BASED_OUTPATIENT_CLINIC_OR_DEPARTMENT_OTHER): Payer: Medicare HMO | Admitting: Physical Therapy

## 2022-12-03 ENCOUNTER — Encounter (HOSPITAL_BASED_OUTPATIENT_CLINIC_OR_DEPARTMENT_OTHER): Payer: Self-pay | Admitting: Family Medicine

## 2022-12-03 DIAGNOSIS — M5459 Other low back pain: Secondary | ICD-10-CM

## 2022-12-03 DIAGNOSIS — M79605 Pain in left leg: Secondary | ICD-10-CM

## 2022-12-03 DIAGNOSIS — R262 Difficulty in walking, not elsewhere classified: Secondary | ICD-10-CM

## 2022-12-03 DIAGNOSIS — M79604 Pain in right leg: Secondary | ICD-10-CM

## 2022-12-03 LAB — BASIC METABOLIC PANEL
BUN/Creatinine Ratio: 18 (ref 9–23)
BUN: 12 mg/dL (ref 6–24)
CO2: 24 mmol/L (ref 20–29)
Calcium: 9.7 mg/dL (ref 8.7–10.2)
Chloride: 104 mmol/L (ref 96–106)
Creatinine, Ser: 0.68 mg/dL (ref 0.57–1.00)
Glucose: 89 mg/dL (ref 70–99)
Potassium: 4.7 mmol/L (ref 3.5–5.2)
Sodium: 140 mmol/L (ref 134–144)
eGFR: 102 mL/min/{1.73_m2} (ref >59)

## 2022-12-03 LAB — CBC WITH DIFFERENTIAL/PLATELET
Basophils Absolute: 0 10*3/uL (ref 0.0–0.2)
Basos: 0 %
EOS (ABSOLUTE): 0.2 10*3/uL (ref 0.0–0.4)
Eos: 2 %
Hematocrit: 41.4 % (ref 34.0–46.6)
Hemoglobin: 13.2 g/dL (ref 11.1–15.9)
Immature Grans (Abs): 0 10*3/uL (ref 0.0–0.1)
Immature Granulocytes: 0 %
Lymphocytes Absolute: 3.9 10*3/uL — ABNORMAL HIGH (ref 0.7–3.1)
Lymphs: 49 %
MCH: 27.2 pg (ref 26.6–33.0)
MCHC: 31.9 g/dL (ref 31.5–35.7)
MCV: 85 fL (ref 79–97)
Monocytes Absolute: 0.7 10*3/uL (ref 0.1–0.9)
Monocytes: 8 %
Neutrophils Absolute: 3.3 10*3/uL (ref 1.4–7.0)
Neutrophils: 41 %
Platelets: 249 10*3/uL (ref 150–450)
RBC: 4.86 x10E6/uL (ref 3.77–5.28)
RDW: 12.7 % (ref 11.7–15.4)
WBC: 8.1 10*3/uL (ref 3.4–10.8)

## 2022-12-03 LAB — LIPID PANEL
Chol/HDL Ratio: 3 ratio (ref 0.0–4.4)
Cholesterol, Total: 161 mg/dL (ref 100–199)
HDL: 54 mg/dL (ref >39)
LDL Chol Calc (NIH): 92 mg/dL (ref 0–99)
Triglycerides: 78 mg/dL (ref 0–149)
VLDL Cholesterol Cal: 15 mg/dL (ref 5–40)

## 2022-12-03 NOTE — Progress Notes (Signed)
 Patient's labs are within normal limits. Follow up as discussed per visit.

## 2022-12-03 NOTE — Telephone Encounter (Signed)
-----   Message from Armida Sans sent at 12/03/2022  9:55 AM EDT ----- Labs from 12/02/2022 shows: Normal liver tests  Continue Humira for psoriasis Keep follow up appt

## 2022-12-03 NOTE — Therapy (Signed)
OUTPATIENT PHYSICAL THERAPY TREATMENT NOTE   Patient Name: Ashley Lewis MRN: [id number] DOB:29-Apr-1965, 57 y.o., female Today's Date: 12/04/2022  END OF SESSION:  PT End of Session - 12/03/22       Visit Number 5     Number of Visits 10     Date for PT Re-Evaluation 12/07/22     Authorization Type [employer] MCR     PT Start Time 1320     PT Stop Time 1400     PT Time Calculation (min) 40 min     Activity Tolerance Patient tolerated treatment well;No increased pain     Behavior During Therapy [facility] for tasks assessed/performed       Past Medical History:  Diagnosis Date   Anemia    iron defi   Atypical chest pain 08/05/2019   Depression    sees Dr. Leigh Aurora   Eczema    Exertional dyspnea 10/29/2021   GERD (gastroesophageal reflux disease)    Hyperlipidemia    Hypertension    Psoriasis    Stroke [facility]) 2011   left thalamic infarct   Past Surgical History:  Procedure Laterality Date   CERVICAL CERCLAGE     x 2, in 1995, 1997   CHOLECYSTECTOMY N/A 02/03/2015   Procedure: LAPAROSCOPIC CHOLECYSTECTOMY;  Surgeon: Rodman Pickle, MD;  Location: [facility] OR;  Service: General;  Laterality: N/A;   Patient Active Problem List   Diagnosis Date Noted   Hematuria 03/12/2022   Wheezing 03/12/2022   Left low back pain 03/12/2022   Pain of left hip 03/12/2022   Exertional dyspnea 10/29/2021   Atypical chest pain 08/05/2019   RBBB 04/10/2017   Psoriasis    Hyperlipidemia    GERD (gastroesophageal reflux disease)    Eczema    Depression    Anemia    Biliary colic 02/03/2015   Neck pain on right side 09/02/2012   Left arm pain 09/02/2012   Dyslipidemia 09/02/2012   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 Steward Drone   REFERRING DIAG: M51.16 Intervertebral disc disorders with radiculopathy, lumbar region   Rationale for Evaluation and Treatment: Rehabilitation   THERAPY DIAG:  Other low back pain - Plan: PT plan of care cert/re-cert   ONSET DATE: 2018     SUBJECTIVE:  SUBJECTIVE STATEMENT: "I feel pretty decent today."  Pt states she doesn't have pain or stiffness today.  Pt doesn't have numbness in R sided toes 2,3 currently though reports numbness in distal L LE on Sunday.  Pt states she felt uncontrollable movements in R thigh the next day after prior Rx.  Pt tried some of the core exercises at home.  Pt has difficulty with donning socks and pants.   PERTINENT HISTORY:  Per chart review, Ashley Lewis is a 57 y.o. female presents today with ongoing radiating lower back pain down the right leg.  She states the second and third toes were numb.  She has previously been seen by Dr. De Peru who obtained x-rays.  She does have a history of a stroke in 2011.  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 Yes: NPRS scale: 0/10 Pain location: central and L flank area Pain description: achey, tender Aggravating factors: wearing certain clothes that touch the area Relieving factors: prednisone; loose clothes   PRECAUTIONS:  None   RED FLAGS: Bowel or bladder incontinence: Yes: with flatulence, there was bowel discharge prior to prednisone tx                WEIGHT BEARING RESTRICTIONS:  No   FALLS:  Has patient fallen in last 6 months? 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 There is no evidence of arthropathy or other focal bone abnormality. Bilateral pelvic phleboliths, and partially calcified uterine fibroids.   No lumbar fx or subluxation noted 04/05/21. CTs clear per chart review of spleen and Urinary Tract.      TREATMENT:                                                                                                                                Reviewed response to prior Rx, HEP compliance, and pain levels. Pt performed:  Supine lumbar rotation 2x10  Bridge with ball b/w knees, arm reaching up 2x10  Supine TrA contraction with 5 sec hold  Supine clams with RTB x15 reps and with GTB x10 reps with TrA 2x10  Supine marching with TrA x 11 reps  Supine alt UE/LE with TrA approx 12 reps  Cat/camel 2x10  Supine HS stretch 2x20-30 sec bilat  Updated HEP and gave pt a HEP handout.  PT educated pt in correct form and appropriate frequency.     PATIENT EDUCATION:  Education details: TrA contractions, relevant anatomy, POC, HEP, exercise form/rationale Person educated: Patient Education method: Explanation, demonstration, verbal cues Education comprehension: verbalized understanding, returned demonstration, verbal cues  required   HOME EXERCISE PROGRAM: [id number]  Updated HEP: - Supine Transversus Abdominis Bracing - Hands on Stomach  - 2 x daily - 7 x weekly - 2 sets - 10 reps - 5 sec hold - Supine March  - 1 x daily - 7 x weekly - 2 sets - 10 reps     ASSESSMENT:   CLINICAL IMPRESSION: Pt presents to Rx denying any pain and numbness today.  Pt requires cuing and instruction for correct form and positioning with exercises.  PT worked on core strengthening including TrA contractions.  PT updated HEP and gave pt a HEP handout.  Pt tolerated exercises well and responded well to Rx having no c/o's after Rx.    OBJECTIVE  IMPAIRMENTS: decreased activity tolerance, decreased mobility, decreased strength, hypomobility, postural dysfunction, and pain.    ACTIVITY LIMITATIONS: carrying, lifting, bending, sitting, standing, transfers, continence, locomotion level, and caring for others   PARTICIPATION LIMITATIONS: meal prep, cleaning, laundry, and community activity   PERSONAL FACTORS: Past/current experiences and 1 comorbidity: history of stroke  are also affecting patient's functional outcome.    REHAB POTENTIAL: Good   CLINICAL DECISION MAKING: Stable/uncomplicated   EVALUATION COMPLEXITY: Low     GOALS: Goals reviewed with patient? Yes   SHORT TERM GOALS: Target date: 11/16/22   Pt will be independent with HEP to assist with lumbar pain management Baseline: not issued at eval due to time and education rendered Goal status: INITIAL   2.  Pt will report improved LBP x 25% with functional mobility Baseline: 0% Goal status: INITIAL       LONG TERM GOALS: Target date: 12/07/22   Pt will report improved LBP >/=50% to assist with care of daughter Baseline: 0% Goal status: INITIAL   2.  Pt will report ability to perform all ADLS with </=3/10 L LBP Baseline: up to 7/10 Goal status: INITIAL   3. Pt will be able to wear all desired clothes with </=3/10 LBP Baseline: unable Goal status: INITIAL   4.  Pt will demo improved bil lumbar rotation to >/=75% present Baseline:50% present Goal status: INITIAL   5.  Foto score:  Baseline: unable to access Goal status: INITIAL         PLAN:   PT FREQUENCY: 2x/week   PT DURATION: 6 weeks   PLANNED INTERVENTIONS: Therapeutic exercises, Therapeutic activity, Neuromuscular re-education, Patient/Family education, Joint mobilization, Dry Needling, Taping, Ultrasound, Manual therapy, and Re-evaluation.   PLAN FOR NEXT SESSION: consider DN, lumbar mobility, general core stabilization   Audie Clear III PT, DPT 12/04/22 11:24 PM

## 2022-12-03 NOTE — Telephone Encounter (Signed)
Advised pt of lab results and she could continue Humira injections.  Advised pt to keep f/u appt./sh

## 2022-12-04 ENCOUNTER — Encounter (HOSPITAL_BASED_OUTPATIENT_CLINIC_OR_DEPARTMENT_OTHER): Payer: Self-pay | Admitting: Physical Therapy

## 2022-12-05 ENCOUNTER — Ambulatory Visit (HOSPITAL_BASED_OUTPATIENT_CLINIC_OR_DEPARTMENT_OTHER): Payer: Medicare HMO | Admitting: Physical Therapy

## 2022-12-09 ENCOUNTER — Other Ambulatory Visit (HOSPITAL_COMMUNITY): Payer: Self-pay

## 2022-12-26 ENCOUNTER — Ambulatory Visit (HOSPITAL_BASED_OUTPATIENT_CLINIC_OR_DEPARTMENT_OTHER): Payer: Medicare HMO | Attending: Orthopaedic Surgery | Admitting: Physical Therapy

## 2022-12-26 DIAGNOSIS — M79605 Pain in left leg: Secondary | ICD-10-CM | POA: Insufficient documentation

## 2022-12-26 DIAGNOSIS — M79604 Pain in right leg: Secondary | ICD-10-CM | POA: Diagnosis not present

## 2022-12-26 DIAGNOSIS — M5459 Other low back pain: Secondary | ICD-10-CM | POA: Insufficient documentation

## 2022-12-26 DIAGNOSIS — R262 Difficulty in walking, not elsewhere classified: Secondary | ICD-10-CM | POA: Insufficient documentation

## 2022-12-26 NOTE — Therapy (Addendum)
 OUTPATIENT PHYSICAL THERAPY TREATMENT NOTE  Progress Note Reporting Period 10/26/2022 to 12/26/2022  See note below for Objective Data and Assessment of Progress/Goals.      Patient Name: Ashley Lewis MRN: [id number] DOB:1965/07/06, 57 y.o., female Today's Date: 12/27/2022  END OF SESSION:  PT End of Session - 12/26/22 1325     Visit Number 6    Number of Visits 10    Date for PT Re-Evaluation 02/06/23    Authorization Type [employer] MCR    PT Start Time 1322    PT Stop Time 1410    PT Time Calculation (min) 48 min    Activity Tolerance Patient tolerated treatment well    Behavior During Therapy [facility] for tasks assessed/performed                 Past Medical History:  Diagnosis Date   Anemia    iron defi   Atypical chest pain 08/05/2019   Depression    sees Dr. Cvejin   Eczema    Exertional dyspnea 10/29/2021   GERD (gastroesophageal reflux disease)    Hyperlipidemia    Hypertension    Psoriasis    Stroke [facility]) 2011   left thalamic infarct   Past Surgical History:  Procedure Laterality Date   CERVICAL CERCLAGE     x 2, in 1995, 1997   CHOLECYSTECTOMY N/A 02/03/2015   Procedure: LAPAROSCOPIC CHOLECYSTECTOMY;  Surgeon: Herlene Beverley Bureau, MD;  Location: [facility] OR;  Service: General;  Laterality: N/A;   Patient Active Problem List   Diagnosis Date Noted   Hematuria 03/12/2022   Wheezing 03/12/2022   Left low back pain 03/12/2022   Pain of left hip 03/12/2022   Exertional dyspnea 10/29/2021   Atypical chest pain 08/05/2019   RBBB 04/10/2017   Psoriasis    Hyperlipidemia    GERD (gastroesophageal reflux disease)    Eczema    Depression    Anemia    Biliary colic 02/03/2015   Neck pain on right side 09/02/2012   Left arm pain 09/02/2012   Dyslipidemia 09/02/2012   Pseudobulbar affect 08/12/2012   Amenorrhea 08/12/2012   Essential hypertension, benign 08/12/2012   History of CVA (cerebrovascular accident) 07/06/2009   MOOD SWINGS 05/09/2009    DEPRESSION, MODERATE, RECURRENT 10/05/2008   ADJ DISORDER WITH MIXED ANXIETY & DEPRESSED MOOD 08/30/2008   Overweight 02/11/2007   ANEMIA, IRON DEFICIENCY 02/11/2007    REFERRING PROVIDER: Dr. Genelle   REFERRING DIAG: M51.16 Intervertebral disc disorders with radiculopathy, lumbar region   Rationale for Evaluation and Treatment: Rehabilitation   THERAPY DIAG:  Other low back pain - Plan: PT plan of care cert/re-cert   ONSET DATE: 2018     SUBJECTIVE:  SUBJECTIVE STATEMENT: Pt states she wants to have a MRI.  She states insurance denied the MRI earlier and was told she had to perform 6 PT appt's.  Pt states therapy has helped though she continues to have pain.  She has been performing her HEP.  Pt reports improved "tailbone" and LE pain.   Pt continues to have improved pain after having a bowel movement.  Pt states she was hurting last night and had much improved pain after having a bowel movement.  Pt states she has increased pain if she is constipated.  Pt states she has been telling MD this awhile.   Pt states she is able to lift her legs better.  Pt is able to get on her knees and get off her knees with less pain.  Pt reports  40% improvement in functional mobility and 50% improvement in pain overall.  Pt reports improved pain with dressing including having a 2/10 pain.  Pt able to help dress her daughter with less pain.  Pt denies any numbness in her toes.   PERTINENT HISTORY:  Per chart review, Ashley Lewis is a 57 y.o. female presents today with ongoing radiating lower back pain down the right leg.  She states the second and third toes were numb.  She has previously been seen by Dr. De Peru who obtained x-rays.  She does have a history of a stroke in 2011.  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 Yes: NPRS scale: 1/10 current and best, 4/10 worst  Pain location: central and L flank area Pain description: achy Aggravating factors: wearing certain clothes that touch the area Relieving factors: prednisone; loose clothes   PRECAUTIONS:  None   RED FLAGS: Bowel or bladder incontinence: Yes: with flatulence, there was bowel discharge prior to prednisone tx                WEIGHT BEARING RESTRICTIONS:  No   FALLS:  Has patient fallen in last 6 months? 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 There is no evidence of arthropathy or other focal bone abnormality. Bilateral pelvic phleboliths, and partially calcified uterine fibroids.   No lumbar fx or subluxation noted 04/05/21. CTs clear per chart review of spleen and Urinary Tract.      TREATMENT:                                                                                                                                PALPATION: Pt has improved mobility in T12/L1  Posture:  Pt had a R lateral  lean at eval, and has no lateral lean today.    Lumbar ROM LUMBAR ROM:    Active  A/PROM  eval AROM  Flexion WNL   Extension WNL   Right lateral flexion WNL   Left lateral flexion WNL   Right rotation 50% limited 25% limited  Left rotation 50% limited 25% limited   (Blank rows = not tested)   MMT: R LE strength 4-/5; L LE strength 4+/5  Hip flexion:  5/5, 4+/5 Knee extension:  5/5, 4+/5 Knee flexion (seated):  5/5, 4/5 DF:  5/5, 5/5 PF (seated):  WFL     SPECIAL TESTS:  Straight leg raise test: R:  Negative, L:  positive  FOTO: Initial/Current: 57/57 with a goal of 63.      PATIENT EDUCATION:  Education details:  objective findings, goal progress, TrA contractions, relevant anatomy, POC, HEP, exercise form/rationale Person educated: Patient Education method: Explanation, demonstration, verbal cues Education comprehension: verbalized understanding, returned demonstration, verbal cues required   HOME EXERCISE PROGRAM: [id number]       ASSESSMENT:   CLINICAL IMPRESSION: Pt presents to Rx stating therapy has helped though she continues to have pain.  She has made progress in PT.  She continues to have increased pain if she is constipated and has improved pain if she has a bowel movement.   She has informed MD.  Pt reports 40% improvement in functional mobility and 50% improvement in pain overall.  Pt reports she is able to get off her knees with less pain and also has less pain with dressing and helping daughter dress.  Pt demonstrates improved posture having erect posture with no lateral lean. Pt continues to be limited with lumbar rotation though has improved.  Pt demonstrates improved R LE strength.  Pt met all goals except partially meeting LTG #2 and not meeting LTG #5.  Pt states she wants to have a MRI due to her continued pain and sx's.    OBJECTIVE IMPAIRMENTS: decreased activity tolerance, decreased mobility, decreased strength, hypomobility, postural dysfunction, and pain.    ACTIVITY LIMITATIONS: carrying, lifting, bending, sitting, standing, transfers, continence, locomotion level, and caring for others   PARTICIPATION LIMITATIONS: meal prep, cleaning, laundry, and community activity   PERSONAL FACTORS: Past/current experiences and 1 comorbidity: history of stroke  are also affecting patient's functional outcome.    REHAB POTENTIAL: Good   CLINICAL DECISION MAKING: Stable/uncomplicated   EVALUATION COMPLEXITY: Low     GOALS: Goals reviewed with patient? Yes   SHORT TERM GOALS: Target date: 11/16/22   Pt will be independent with HEP to assist with lumbar pain management Baseline:   Goal status: GOAL MET   10/3   2.  Pt will report improved LBP x 25% with functional mobility Baseline: 0% Goal status: GOAL MET     10/3       LONG TERM GOALS: Target date: 12/07/22   Pt will report improved LBP >/=50% to assist with care of daughter Baseline: Goal status: GOAL MET  10/3   2.  Pt will report ability to perform all ADLS with </=3/10 L LBP Baseline: up to 4/10, but mainly 1-2/10 Goal status: PARTIALLY MET    10/3 Target date:  01/23/23   3. Pt will be able to wear all desired clothes with </=3/10 LBP Baseline: unable Goal status: GOAL MET    10/3   4.  Pt will demo improved bil lumbar rotation to >/=75% present Baseline: Goal status: GOAL MET  10/3   5.  Foto score:  Baseline: unable to access Goal status: score did not change Target date:  01/23/23         PLAN:   PT FREQUENCY: 1x/wk   PT DURATION: 4-6 weeks (will put cert out for 6 weeks due to pt being on hold)   PLANNED INTERVENTIONS: Therapeutic exercises, Therapeutic activity, Neuromuscular re-education, Patient/Family education, Joint mobilization, Dry Needling, Taping, Ultrasound, Manual therapy, and Re-evaluation.   PLAN FOR NEXT SESSION:  Pt to be put on hold until she sees MD.  Pt wants to have a MRI.   Leigh Minerva III PT, DPT 12/27/22 6:36 PM  PHYSICAL THERAPY DISCHARGE SUMMARY  Visits from Start of Care: 6  Current functional level related to goals / functional outcomes: See above   Remaining deficits: See above   Education / Equipment: See above   Patient was seen in PT from 10/26/22 to 12/26/22.  A progress note was completed on her last visit.  Please see above for detailed measurements and goal progress.  The plan indicated:  Pt to be put on hold until she sees MD.  Pt wanted to have a MRI.  PT did not hear back from Pt and she will be considered discharged from skilled PT.  Leigh Minerva III PT, DPT 09/01/23 11:36 AM

## 2022-12-27 ENCOUNTER — Encounter (HOSPITAL_BASED_OUTPATIENT_CLINIC_OR_DEPARTMENT_OTHER): Payer: Self-pay | Admitting: Physical Therapy

## 2023-02-07 DIAGNOSIS — R350 Frequency of micturition: Secondary | ICD-10-CM | POA: Diagnosis not present

## 2023-02-07 DIAGNOSIS — G44209 Tension-type headache, unspecified, not intractable: Secondary | ICD-10-CM | POA: Diagnosis not present

## 2023-02-07 DIAGNOSIS — R051 Acute cough: Secondary | ICD-10-CM | POA: Diagnosis not present

## 2023-02-12 ENCOUNTER — Encounter: Payer: Self-pay | Admitting: Dermatology

## 2023-02-12 ENCOUNTER — Ambulatory Visit: Payer: Medicare HMO | Admitting: Dermatology

## 2023-02-12 ENCOUNTER — Other Ambulatory Visit: Payer: Self-pay

## 2023-02-12 DIAGNOSIS — D492 Neoplasm of unspecified behavior of bone, soft tissue, and skin: Secondary | ICD-10-CM

## 2023-02-12 DIAGNOSIS — L409 Psoriasis, unspecified: Secondary | ICD-10-CM | POA: Diagnosis not present

## 2023-02-12 DIAGNOSIS — Z7189 Other specified counseling: Secondary | ICD-10-CM

## 2023-02-12 DIAGNOSIS — L918 Other hypertrophic disorders of the skin: Secondary | ICD-10-CM

## 2023-02-12 DIAGNOSIS — Z79899 Other long term (current) drug therapy: Secondary | ICD-10-CM | POA: Diagnosis not present

## 2023-02-12 MED ORDER — HUMIRA (2 PEN) 40 MG/0.4ML ~~LOC~~ AJKT: 40.0000 mg | AUTO-INJECTOR | SUBCUTANEOUS | 5 refills | Status: DC

## 2023-02-12 NOTE — Progress Notes (Signed)
Follow-Up Visit   Subjective  Ashley Lewis is a 57 y.o. female who presents for the following: Psoriasis. On Humira injections every 2 weeks. Scattered body areas. Controlled well with Humira. Now has dark spots.   Check spots on legs. C/O rough areas.   Recheck lesion at groin area.  Tried OTC Freeze off. Irritated area, did not fall off. Red-pink now, was brown before. Rubbed, irritated.   The following portions of the chart were reviewed this encounter and updated as appropriate: medications, allergies, medical history  Review of Systems:  No other skin or systemic complaints except as noted in HPI or Assessment and Plan.  Objective  Well appearing patient in no apparent distress; mood and affect are within normal limits.  Areas Examined: Face, legs, arms, groin  Relevant exam findings are noted in the Assessment and Plan.    Right Medial Thigh 0.6 cm pink fleshy papule         Assessment & Plan   Neoplasm of skin Right Medial Thigh  Epidermal / dermal shaving  Lesion diameter (cm):  0.6 Informed consent: discussed and consent obtained   Timeout: patient name, date of birth, surgical site, and procedure verified   Procedure prep:  Patient was prepped and draped in usual sterile fashion Prep type:  Isopropyl alcohol Anesthesia: the lesion was anesthetized in a standard fashion   Anesthetic:  1% lidocaine w/ epinephrine 1-100,000 buffered w/ 8.4% NaHCO3 Instrument used: flexible razor blade   Hemostasis achieved with: pressure, aluminum chloride and electrodesiccation   Outcome: patient tolerated procedure well   Post-procedure details: sterile dressing applied and wound care instructions given   Dressing type: bandage and petrolatum    Specimen 1 - Surgical pathology Differential Diagnosis: Irritated acrochordon, R/O dysplasia   Check Margins: No    PSORIASIS on systemic treatment Mild xerosis and scale at anterior lower legs 2% BSA.  Chronic  and persistent condition with duration or expected duration over one year. Condition is improving with treatment but not currently at goal.   Counseling and coordination of care for severe psoriasis on systemic treatment  Psoriasis - severe on systemic treatment.  Psoriasis is a chronic non-curable, but treatable genetic/hereditary disease that may have other systemic features affecting other organ systems such as joints (Psoriatic Arthritis).  It is linked with heart disease, inflammatory bowel disease, non-alcoholic fatty liver disease, and depression. Significant skin psoriasis and/or psoriatic arthritis may have significant symptoms and affects activities of daily activity and often benefits from systemic treatments.  These systemic treatments have some potential side effects including immunosuppression and require pre-treatment laboratory screening and periodic laboratory monitoring and periodic in person evaluation and monitoring by the attending dermatologist physician (long term medication management).   Patient does have joint pain but has improved since starting Humira.  Treatment Plan: Continue Humira 80 mg every 2 weeks  TB test due at next visit.   Reviewed risks of biologics including immunosuppression, infections, injection site reaction, and failure to improve condition. Goal is control of skin condition, not cure.  Some older biologics such as Humira and Enbrel may slightly increase risk of malignancy and may worsen congestive heart failure.  Taltz and Cosentyx may cause inflammatory bowel disease to flare. The use of biologics requires long term medication management, including periodic office visits and monitoring of blood work.   Long term medication management.  Patient is using long term (months to years) prescription medication  to control their dermatologic condition.  These medications require  periodic monitoring to evaluate for efficacy and side effects and may require  periodic laboratory monitoring.   Return in about 6 months (around 08/12/2023) for Psoriasis Follow Up.  I, Lawson Radar, CMA, am acting as scribe for Armida Sans, MD.   Documentation: I have reviewed the above documentation for accuracy and completeness, and I agree with the above.  Armida Sans, MD

## 2023-02-12 NOTE — Patient Instructions (Signed)
Continue Humira 80 mg every 2 weeks  Reviewed risks of biologics including immunosuppression, infections, injection site reaction, and failure to improve condition. Goal is control of skin condition, not cure.  Some older biologics such as Humira and Enbrel may slightly increase risk of malignancy and may worsen congestive heart failure.  Taltz and Cosentyx may cause inflammatory bowel disease to flare. The use of biologics requires long term medication management, including periodic office visits and monitoring of blood work.      Wound Care Instructions  Cleanse wound gently with soap and water once a day then pat dry with clean gauze. Apply a thin coat of Petrolatum (petroleum jelly, "Vaseline") over the wound (unless you have an allergy to this). We recommend that you use a new, sterile tube of Vaseline. Do not pick or remove scabs. Do not remove the yellow or white "healing tissue" from the base of the wound.  Cover the wound with fresh, clean, nonstick gauze and secure with paper tape. You may use Band-Aids in place of gauze and tape if the wound is small enough, but would recommend trimming much of the tape off as there is often too much. Sometimes Band-Aids can irritate the skin.  You should call the office for your biopsy report after 1 week if you have not already been contacted.  If you experience any problems, such as abnormal amounts of bleeding, swelling, significant bruising, significant pain, or evidence of infection, please call the office immediately.  FOR ADULT SURGERY PATIENTS: If you need something for pain relief you may take 1 extra strength Tylenol (acetaminophen) AND 2 Ibuprofen (200mg  each) together every 4 hours as needed for pain. (do not take these if you are allergic to them or if you have a reason you should not take them.) Typically, you may only need pain medication for 1 to 3 days.     Due to recent changes in healthcare laws, you may see results of your  pathology and/or laboratory studies on MyChart before the doctors have had a chance to review them. We understand that in some cases there may be results that are confusing or concerning to you. Please understand that not all results are received at the same time and often the doctors may need to interpret multiple results in order to provide you with the best plan of care or course of treatment. Therefore, we ask that you please give [place] 2 business days to thoroughly review all your results before contacting the office for clarification. Should we see a critical lab result, you will be contacted sooner.   If You Need Anything After Your Visit  If you have any questions or concerns for your doctor, please call our main line at [phone] and press option 4 to reach your doctor's medical assistant. If no one answers, please leave a voicemail as directed and we will return your call as soon as possible. Messages left after 4 pm will be answered the following business day.   You may also send [place] a message via MyChart. We typically respond to MyChart messages within 1-2 business days.  For prescription refills, please ask your pharmacy to contact our office. Our fax number is [phone].  If you have an urgent issue when the clinic is closed that cannot wait until the next business day, you can page your doctor at the number below.    Please note that while we do our best to be available for urgent issues outside of office hours,  we are not available 24/7.   If you have an urgent issue and are unable to reach [place], you may choose to seek medical care at your doctor's office, retail clinic, urgent care center, or emergency room.  If you have a medical emergency, please immediately call 911 or go to the emergency department.  Pager Numbers  - Dr. Gwen Pounds: [phone]  - Dr. Roseanne Reno: [phone]  - Dr. Katrinka Blazing: [phone]   In the event of inclement weather, please call our main line at  [phone] for an update on the status of any delays or closures.  Dermatology Medication Tips: Please keep the boxes that topical medications come in in order to help keep track of the instructions about where and how to use these. Pharmacies typically print the medication instructions only on the boxes and not directly on the medication tubes.   If your medication is too expensive, please contact our office at [phone] option 4 or send [place] a message through MyChart.   We are unable to tell what your co-pay for medications will be in advance as this is different depending on your insurance coverage. However, we may be able to find a substitute medication at lower cost or fill out paperwork to get insurance to cover a needed medication.   If a prior authorization is required to get your medication covered by your insurance company, please allow [place] 1-2 business days to complete this process.  Drug prices often vary depending on where the prescription is filled and some pharmacies may offer cheaper prices.  The website www.goodrx.com contains coupons for medications through different pharmacies. The prices here do not account for what the cost may be with help from insurance (it may be cheaper with your insurance), but the website can give you the price if you did not use any insurance.  - You can print the associated coupon and take it with your prescription to the pharmacy.  - You may also stop by our office during regular business hours and pick up a GoodRx coupon card.  - If you need your prescription sent electronically to a different pharmacy, notify our office through [facility] or by phone at [phone] option 4.     Si Usted Necesita Algo Despus de Su Visita  Tambin puede enviarnos un mensaje a travs de Clinical cytogeneticist. Por lo general respondemos a los mensajes de MyChart en el transcurso de 1 a 2 das hbiles.  Para renovar recetas, por favor pida a su farmacia que se  ponga en contacto con nuestra oficina. Annie Sable de fax es [place] [phone].  Si tiene un asunto urgente cuando la clnica est cerrada y que no puede esperar hasta el siguiente da hbil, puede llamar/localizar a su doctor(a) al nmero que aparece a continuacin.   Por favor, tenga en cuenta que aunque hacemos todo lo posible para estar disponibles para asuntos urgentes fuera del horario de [place], no estamos disponibles las 24 horas del da, los 7 [address]  [address] de la [place].   Si tiene un problema urgente y no puede comunicarse con nosotros, puede optar por buscar atencin mdica  en el consultorio de su doctor(a), en una clnica privada, en un centro de atencin urgente o en una sala de emergencias.  Si tiene Engineer, drilling, por favor llame inmediatamente al 911 o vaya a la sala de emergencias.  Nmeros de bper  - Dr. Gwen Pounds: [phone]  - Dra. Roseanne Reno: [phone]  - Dr. Katrinka Blazing: [phone]   En caso de inclemencias  del [place], por favor llame a Ferne Coe lnea principal al [phone] para una actualizacin sobre el estado de cualquier retraso o cierre.  Consejos para la medicacin en dermatologa: Por favor, guarde las cajas en las que vienen los medicamentos de uso tpico para ayudarle a seguir las instrucciones sobre dnde y cmo usarlos. Las farmacias generalmente imprimen las instrucciones del medicamento slo en las cajas y no directamente en los tubos del [place].   Si su medicamento es muy caro, por favor, pngase en contacto con Rolm Gala llamando al [phone] y presione la opcin 4 o envenos un mensaje a travs de Clinical cytogeneticist.   No podemos decirle cul ser su copago por los medicamentos por adelantado ya que esto es diferente dependiendo de la cobertura de su seguro. Sin embargo, es posible que podamos encontrar un medicamento sustituto a Audiological scientist un formulario para que el seguro cubra el medicamento que se considera necesario.   Si se requiere  una autorizacin previa para que su compaa de seguros [place] su medicamento, por favor permtanos de 1 a 2 das hbiles para completar [address]  Los precios de los medicamentos varan con frecuencia dependiendo del Environmental consultant de dnde se surte la receta y alguna farmacias pueden ofrecer precios ms baratos.  El sitio web www.goodrx.com tiene cupones para medicamentos de Health and safety inspector. Los precios aqu no tienen en cuenta lo que podra costar con la ayuda del seguro (puede ser ms barato con su seguro), pero el sitio web puede darle el precio si no utiliz Tourist information centre manager.  - Puede imprimir el cupn correspondiente y llevarlo con su receta a la farmacia.  - Tambin puede pasar por nuestra oficina durante el horario de atencin regular y Education officer, museum una tarjeta de cupones de GoodRx.  - Si necesita que su receta se enve electrnicamente a una farmacia diferente, informe a nuestra oficina a travs de MyChart de [place] o por telfono llamando al [phone] y presione la opcin 4.

## 2023-02-13 ENCOUNTER — Telehealth: Payer: Self-pay

## 2023-02-13 LAB — SURGICAL PATHOLOGY

## 2023-02-13 NOTE — Telephone Encounter (Signed)
Pt informed of pathology results.  

## 2023-02-13 NOTE — Telephone Encounter (Signed)
-----   Message from Armida Sans sent at 02/13/2023  5:25 PM EST ----- FINAL DIAGNOSIS        1. Skin, right medial thigh :       [place], INFARCTED, ULCERATED   Benign ulcerated skin tag No further treatment needed

## 2023-02-22 ENCOUNTER — Other Ambulatory Visit (HOSPITAL_BASED_OUTPATIENT_CLINIC_OR_DEPARTMENT_OTHER): Payer: Self-pay | Admitting: Family

## 2023-03-13 ENCOUNTER — Other Ambulatory Visit (HOSPITAL_BASED_OUTPATIENT_CLINIC_OR_DEPARTMENT_OTHER): Payer: Self-pay | Admitting: Family Medicine

## 2023-04-08 ENCOUNTER — Other Ambulatory Visit (HOSPITAL_BASED_OUTPATIENT_CLINIC_OR_DEPARTMENT_OTHER): Payer: Self-pay | Admitting: Family Medicine

## 2023-04-15 ENCOUNTER — Encounter (HOSPITAL_BASED_OUTPATIENT_CLINIC_OR_DEPARTMENT_OTHER): Payer: Self-pay | Admitting: Cardiovascular Disease

## 2023-04-15 ENCOUNTER — Telehealth: Payer: Self-pay | Admitting: Pharmacy Technician

## 2023-04-15 ENCOUNTER — Other Ambulatory Visit (HOSPITAL_COMMUNITY): Payer: Self-pay

## 2023-04-15 NOTE — Telephone Encounter (Signed)
Pharmacy Patient Advocate Encounter  Received notification from [employer] that Prior Authorization for repatha has been APPROVED from 03/26/23 to 04/14/24. Ran test claim, Copay is $12.15 one month. This test claim was processed through [facility]- copay amounts may vary at other pharmacies due to pharmacy/plan contracts, or as the patient moves through the different stages of their insurance plan.   PA #/Case ID/Reference #: [id number]

## 2023-04-15 NOTE — Telephone Encounter (Signed)
Pharmacy Patient Advocate Encounter   Received notification from Physician's Office that prior authorization for repatha is required/requested.   Insurance verification completed.   The patient is insured through [employer] .   Per test claim: PA required; PA submitted to above mentioned insurance via CoverMyMeds Key/confirmation #/EOC [id number] Status is pending

## 2023-04-16 ENCOUNTER — Other Ambulatory Visit: Payer: Self-pay

## 2023-04-21 ENCOUNTER — Other Ambulatory Visit (HOSPITAL_BASED_OUTPATIENT_CLINIC_OR_DEPARTMENT_OTHER): Payer: Self-pay | Admitting: *Deleted

## 2023-04-21 MED ORDER — VALSARTAN 80 MG PO TABS: 80.0000 mg | ORAL_TABLET | Freq: Every day | ORAL | 1 refills | Status: DC

## 2023-04-22 ENCOUNTER — Encounter: Payer: Self-pay | Admitting: Dermatology

## 2023-04-23 ENCOUNTER — Other Ambulatory Visit (HOSPITAL_BASED_OUTPATIENT_CLINIC_OR_DEPARTMENT_OTHER): Payer: Self-pay | Admitting: *Deleted

## 2023-06-04 ENCOUNTER — Ambulatory Visit (INDEPENDENT_AMBULATORY_CARE_PROVIDER_SITE_OTHER): Payer: Medicare (Managed Care) | Admitting: Family Medicine

## 2023-06-04 ENCOUNTER — Encounter (HOSPITAL_BASED_OUTPATIENT_CLINIC_OR_DEPARTMENT_OTHER): Payer: Self-pay | Admitting: *Deleted

## 2023-06-04 VITALS — BP 124/72 | HR 81 | Ht 63.0 in | Wt 209.0 lb

## 2023-06-04 DIAGNOSIS — I10 Essential (primary) hypertension: Secondary | ICD-10-CM

## 2023-06-04 DIAGNOSIS — E785 Hyperlipidemia, unspecified: Secondary | ICD-10-CM | POA: Diagnosis not present

## 2023-06-04 DIAGNOSIS — E669 Obesity, unspecified: Secondary | ICD-10-CM | POA: Insufficient documentation

## 2023-06-04 DIAGNOSIS — Z0000 Encounter for general adult medical examination without abnormal findings: Secondary | ICD-10-CM

## 2023-06-04 DIAGNOSIS — M25552 Pain in left hip: Secondary | ICD-10-CM | POA: Diagnosis not present

## 2023-06-04 DIAGNOSIS — L290 Pruritus ani: Secondary | ICD-10-CM | POA: Insufficient documentation

## 2023-06-04 DIAGNOSIS — G8929 Other chronic pain: Secondary | ICD-10-CM

## 2023-06-04 DIAGNOSIS — Z862 Personal history of diseases of the blood and blood-forming organs and certain disorders involving the immune mechanism: Secondary | ICD-10-CM | POA: Insufficient documentation

## 2023-06-04 NOTE — Patient Instructions (Signed)
 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

## 2023-06-04 NOTE — Progress Notes (Signed)
 Subjective:   Ashley Lewis 22-Sep-1965 06/04/2023  Chief Complaint  Patient presents with   Medical Management of Chronic Issues    53-month follow up; has been having pain in her left hip that did get better for a little while after doing PT but then the pain came back.    HPI: Ashley Lewis presents today for re-assessment and management of chronic medical conditions.  Ashley Lewis: Ashley Lewis presents for the medical management of Ashley Lewis.  Patient's current Ashley Lewis medication regimen is: Valsartan 80mg , Amlodipine 5mg   Patient is  currently taking prescribed medications for HTN.  Patient is not regularly keeping a check on BP at home.  Adhering to low sodium diet: Yes Exercising Regularly: No Denies headache, dizziness, CP, SHOB, vision changes.   BP Readings from Last 3 Encounters:  06/04/23 124/72  12/02/22 137/81  08/22/22 (!) 140/81    HYPERLIPIDEMIA: Ashley Lewis presents for the medical management of hyperlipidemia.  Patient's current HLD regimen is: Repatha 140mg  Q14 days  Patient is  currently taking prescribed medications for HLD.  Adhering to heathy diet: Yes Exercising regularly: No Denies myalgias.  Lab Results  Component Value Date   CHOL 161 12/02/2022   HDL 54 12/02/2022   LDLCALC 92 12/02/2022   TRIG 78 12/02/2022   CHOLHDL 3.0 12/02/2022    HIP PAIN Patient reports chronic pain of left hip ongoing for over 1 year.  She states that she did have an x-ray in January 2024 which was unremarkable and proceeded with physical therapy which did provide mild to moderate improvement.  She does not take any over-the-counter medications for pain relief.  She started to notice pain returning and states that the pain is worsened at night and is unable to sleep on her left side due to pain.  She denies any weakness, clicking, popping, of the left hip.  Denies recent injury, fall or accident.  Denies numbness, tingling, low back  pain, incontinence of bowel or bladder or edema left lower extremity.  She would prefer to return to PT as this did provide improvement previously.   ANEMIA :  Ashley Lewis presents with history of anemia. Patient is currently taking Humira for psoriasis and would like CBC checked.  Current medication : None  Well controlled: Yes, currently.  Denies bloody stools, hematuria, excessive fatigue, palpitations, pica.  Patient is not seeing hematology for management.    Lab Results  Component Value Date   WBC 8.1 12/02/2022   HGB 13.2 12/02/2022   HCT 41.4 12/02/2022   MCV 85 12/02/2022   PLT 249 12/02/2022    The following portions of the patient's history were reviewed and updated as appropriate: past medical history, past surgical history, family history, social history, allergies, medications, and problem list.   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 Leigh Aurora   Eczema    Exertional dyspnea 10/29/2021   GERD (gastroesophageal reflux disease)    Hematuria 03/12/2022    Hyperlipidemia    Ashley Lewis    Psoriasis    Stroke [facility]) 2011   left thalamic infarct   Past Surgical History:  Procedure Laterality Date   CERVICAL CERCLAGE     x 2, in 1995, 1997   CHOLECYSTECTOMY N/A 02/03/2015   Procedure: LAPAROSCOPIC CHOLECYSTECTOMY;  Surgeon: De Blanch Kinsinger, MD;  Location: [facility] OR;  Service: General;  Laterality: N/A;   Family History  Problem Relation Age of Onset   Cancer Mother 21       breast cancer x 2   Cancer Father 60       pancreatic   Cancer Maternal Grandmother    Heart failure Maternal Grandfather    Outpatient Medications Prior to Visit  Medication Sig Dispense Refill   adalimumab (HUMIRA, 2 PEN,) 40 MG/0.4ML pen Inject 0.4 mLs (40 mg total) into the skin as directed. Inject 0.4 mLs (40 mg total) into the skin every two weeks. Starting on day 29. 2 each 5   amLODipine (NORVASC) 5 MG tablet TAKE 1 TABLET (5 MG TOTAL) BY MOUTH DAILY. 90 tablet 1   aspirin EC 81 MG tablet Take 81 mg by mouth daily.     Docusate Sodium (DULCOLAX STOOL SOFTENER PO) Take by mouth.     Evolocumab (REPATHA SURECLICK) 140 MG/ML SOAJ INJECT 140 MG INTO THE SKIN EVERY 14 (FOURTEEN) DAYS. 6 mL 3   omeprazole (PRILOSEC) 40 MG capsule Take 40 mg by mouth daily.     valsartan (DIOVAN) 80 MG tablet Take 1 tablet (80 mg total) by mouth daily. 90 tablet 1   Adalimumab (HUMIRA, 2 PEN,) 80 MG/0.8ML PNKT Inject 80 mg into the skin every 14 (fourteen) days. Starting on day 29. 2 each 5   No facility-administered medications prior to visit.   Allergies  Allergen Reactions   Lisinopril Cough and Other (See Comments)    Other Reaction(s): Cough (ALLERGY/intolerance)  Drip in her throat, coughing, and couldn't breathe   Risankizumab Rash, Swelling and Anaphylaxis     ROS: A complete ROS was performed with pertinent positives/negatives noted in the HPI. The remainder of the ROS are negative.    Objective:   Today's Vitals   06/04/23 1358  BP: 124/72  Pulse: 81   SpO2: 98%  Weight: 209 lb (94.8 kg)  Height: 5\' 3"  (1.6 m)    Physical Exam          GENERAL: Well-appearing, in NAD. Well nourished.  SKIN: Pink, warm and dry.  Head: Normocephalic. NECK: Trachea midline. Full ROM w/o pain or tenderness.  EARS: Tympanic membranes are intact, translucent without bulging and without drainage. Appropriate landmarks visualized.  EYES: Conjunctiva clear without exudates. EOMI, PERRL, no drainage present.  NOSE: Septum midline w/o deformity. Nares patent, mucosa pink and non-inflamed w/o drainage. No sinus tenderness.  THROAT: Uvula midline. Oropharynx clear. Tonsils non-inflamed without exudate. Mucous membranes pink and moist.  RESPIRATORY: Chest wall symmetrical. Respirations even and non-labored. Breath sounds clear to auscultation bilaterally.  CARDIAC: S1, S2 present, regular rate and rhythm without murmur or  gallops. Peripheral pulses 2+ bilaterally.  MSK: Muscle tone and strength appropriate for age. Point tenderness to left lateral thigh and tight muscle banding present to left lateral thigh. Mild pain produced with flexion and external rotation of left hip.   EXTREMITIES: Without clubbing, cyanosis, or edema. Full ROM present to bilateral lower extremities.  NEUROLOGIC: No motor or sensory deficits. Steady, even gait. C2-C12 intact.  PSYCH/MENTAL STATUS: Alert, oriented x 3. Cooperative, appropriate mood and affect.   Health Maintenance Due  Topic Date Due   Cervical Cancer Screening (HPV/Pap Cotest)  06/20/2021    No results found for any visits on 06/04/23.  The ASCVD Risk score (Arnett DK, et al., 2019) failed to calculate for the following reasons:   Risk score cannot be calculated because patient has a medical history suggesting prior/existing ASCVD     Assessment & Plan:  1. Essential Ashley Lewis, benign (Primary) Well-controlled.  Continue medication regimen as prescribed and obtain CMP to evaluate renal and hepatic function today  with lab work. - Comprehensive metabolic panel  2. Hyperlipidemia, unspecified hyperlipidemia type Well-controlled on Repatha and injections managed by cardiology currently.  Patient requested PCP to draw lipid panel.  Will send results to cardiology as patient needs to make upcoming follow-up with them. - Lipid panel  3. Obesity (BMI 30-39.9) Discussed healthy lifestyle, dietary recommendations and exercise.  Will obtain A1c as she does have a history of prediabetes with A1c of 5.9 approximately 10 years ago. - Hemoglobin A1c  4. Chronic left hip pain Discussed possible signs and symptoms of bursitis versus impingement syndrome versus IT band syndrome.  I believe patient would have benefit from physical therapy and discussed possible joint injection with glucocorticoid.  Patient would like to trial PT first and referral placed. - Ambulatory referral to Physical Therapy  5. History of anemia Controlled.  Will assess CBC due to patient's history of anemia and on Humira. - CBC with Differential/Platelet  6. Healthcare maintenance Patient reports up-to-date with Pap smear and mammogram.  Will obtain results from Dr. Cherly Hensen, OB/GYN.  No orders of the defined types were placed in this encounter.  Lab Orders         Comprehensive metabolic panel         CBC with Differential/Platelet         Lipid panel         Hemoglobin A1c      Return in about 6 months (around 12/05/2023) for Follow up Chronic Conditions .    Patient to reach out to office if new, worrisome, or unresolved symptoms arise or if no improvement in patient's condition. Patient verbalized understanding and is agreeable to treatment plan. All questions answered to patient's satisfaction.    Hilbert Bible, [place]

## 2023-06-05 LAB — CBC WITH DIFFERENTIAL/PLATELET
Basophils Absolute: 0 10*3/uL (ref 0.0–0.2)
Basos: 1 %
EOS (ABSOLUTE): 0.2 10*3/uL (ref 0.0–0.4)
Eos: 3 %
Hematocrit: 41.3 % (ref 34.0–46.6)
Hemoglobin: 12.9 g/dL (ref 11.1–15.9)
Immature Grans (Abs): 0 10*3/uL (ref 0.0–0.1)
Immature Granulocytes: 0 %
Lymphocytes Absolute: 3.4 10*3/uL — ABNORMAL HIGH (ref 0.7–3.1)
Lymphs: 44 %
MCH: 27.2 pg (ref 26.6–33.0)
MCHC: 31.2 g/dL — ABNORMAL LOW (ref 31.5–35.7)
MCV: 87 fL (ref 79–97)
Monocytes Absolute: 0.6 10*3/uL (ref 0.1–0.9)
Monocytes: 9 %
Neutrophils Absolute: 3.2 10*3/uL (ref 1.4–7.0)
Neutrophils: 43 %
Platelets: 264 10*3/uL (ref 150–450)
RBC: 4.74 x10E6/uL (ref 3.77–5.28)
RDW: 12.4 % (ref 11.7–15.4)
WBC: 7.4 10*3/uL (ref 3.4–10.8)

## 2023-06-05 LAB — COMPREHENSIVE METABOLIC PANEL
ALT: 17 IU/L (ref 0–32)
AST: 18 IU/L (ref 0–40)
Albumin: 4.5 g/dL (ref 3.8–4.9)
Alkaline Phosphatase: 64 IU/L (ref 44–121)
BUN/Creatinine Ratio: 15 (ref 9–23)
BUN: 10 mg/dL (ref 6–24)
Bilirubin Total: <0.2 mg/dL (ref 0.0–1.2)
CO2: 22 mmol/L (ref 20–29)
Calcium: 9.3 mg/dL (ref 8.7–10.2)
Chloride: 104 mmol/L (ref 96–106)
Creatinine, Ser: 0.67 mg/dL (ref 0.57–1.00)
Globulin, Total: 3.4 g/dL (ref 1.5–4.5)
Glucose: 87 mg/dL (ref 70–99)
Potassium: 4.4 mmol/L (ref 3.5–5.2)
Sodium: 140 mmol/L (ref 134–144)
Total Protein: 7.9 g/dL (ref 6.0–8.5)
eGFR: 102 mL/min/{1.73_m2} (ref >59)

## 2023-06-05 LAB — LIPID PANEL
Chol/HDL Ratio: 3.4 ratio (ref 0.0–4.4)
Cholesterol, Total: 168 mg/dL (ref 100–199)
HDL: 50 mg/dL (ref >39)
LDL Chol Calc (NIH): 98 mg/dL (ref 0–99)
Triglycerides: 108 mg/dL (ref 0–149)
VLDL Cholesterol Cal: 20 mg/dL (ref 5–40)

## 2023-06-05 LAB — HEMOGLOBIN A1C
Est. average glucose Bld gHb Est-mCnc: 117 mg/dL
Hgb A1c MFr Bld: 5.7 % — ABNORMAL HIGH (ref 4.8–5.6)

## 2023-06-07 ENCOUNTER — Encounter (HOSPITAL_BASED_OUTPATIENT_CLINIC_OR_DEPARTMENT_OTHER): Payer: Self-pay | Admitting: Family Medicine

## 2023-06-07 DIAGNOSIS — R7303 Prediabetes: Secondary | ICD-10-CM | POA: Insufficient documentation

## 2023-06-09 ENCOUNTER — Other Ambulatory Visit (HOSPITAL_BASED_OUTPATIENT_CLINIC_OR_DEPARTMENT_OTHER): Payer: Self-pay | Admitting: *Deleted

## 2023-06-09 MED ORDER — VALSARTAN 80 MG PO TABS: 80.0000 mg | ORAL_TABLET | Freq: Every day | ORAL | 1 refills | Status: DC

## 2023-07-08 ENCOUNTER — Ambulatory Visit (HOSPITAL_BASED_OUTPATIENT_CLINIC_OR_DEPARTMENT_OTHER): Payer: Medicare (Managed Care) | Admitting: Physical Therapy

## 2023-07-17 ENCOUNTER — Encounter (HOSPITAL_BASED_OUTPATIENT_CLINIC_OR_DEPARTMENT_OTHER): Payer: Self-pay | Admitting: Family Medicine

## 2023-07-17 ENCOUNTER — Other Ambulatory Visit (HOSPITAL_BASED_OUTPATIENT_CLINIC_OR_DEPARTMENT_OTHER): Payer: Self-pay

## 2023-07-17 ENCOUNTER — Ambulatory Visit (INDEPENDENT_AMBULATORY_CARE_PROVIDER_SITE_OTHER): Payer: Medicare (Managed Care) | Admitting: Family Medicine

## 2023-07-17 VITALS — BP 150/91 | HR 85 | Ht 63.0 in | Wt 211.8 lb

## 2023-07-17 DIAGNOSIS — R159 Full incontinence of feces: Secondary | ICD-10-CM | POA: Insufficient documentation

## 2023-07-17 DIAGNOSIS — R350 Frequency of micturition: Secondary | ICD-10-CM

## 2023-07-17 DIAGNOSIS — R102 Pelvic and perineal pain: Secondary | ICD-10-CM

## 2023-07-17 LAB — POCT URINALYSIS DIP (CLINITEK)
Bilirubin, UA: NEGATIVE
Glucose, UA: NEGATIVE mg/dL
Ketones, POC UA: NEGATIVE mg/dL
Leukocytes, UA: NEGATIVE
Nitrite, UA: NEGATIVE
POC PROTEIN,UA: NEGATIVE
Spec Grav, UA: >=1.03 — AB (ref 1.010–1.025)
Urobilinogen, UA: 0.2 U/dL
pH, UA: 5.5 (ref 5.0–8.0)

## 2023-07-17 MED ORDER — CEPHALEXIN 500 MG PO CAPS
500.0000 mg | ORAL_CAPSULE | Freq: Two times a day (BID) | ORAL | 0 refills | Status: DC
  Filled 2023-07-17: qty 14, 7d supply, fill #0

## 2023-07-17 NOTE — Progress Notes (Signed)
 Established Care Office Visit  Subjective:   Ashley Lewis 02-05-1966 07/17/2023  Chief Complaint  Patient presents with   Urinary Tract Infection    Pressure, frequency, pain in lower abdomen    HPI: Patient reports recurrent pelvic pain and urinary symptoms with cramping and frequency for the past 2-3 years. She reports dysuria occurring for the past 2 weeks with urgency, frequency.  Patient recently treated for UTI in November 2024 with Keflex .  Reports improvement of symptoms after antibiotic therapy.  She is taking Azo consistently.  She has been treated for UTI's and urinary frequency in the past 2-3 years with mostly unremarkable urinalysis. Small blood present in 03/15/2022 and today 07/18/2023. Urine culture in 02/07/2023 was negative. Reports hx of renal stone in 1980's and hx of tobacco use quitting approx. 11 years ago. She states pain can radiate into her abdomen and back. She states at times she does feel like mild protrusion into the vaginal area. Reports hx of uterine fibroid in the past.  Denies vaginal bleeding, discharge or itching.  Denies exposure to STI or STD.  She reports left lower quadrant pelvic pain that at times will radiate to her back and into suprapubic area.   The following portions of the patient's history were reviewed and updated as appropriate: past medical history, past surgical history, family history, social history, allergies, medications, and problem list.   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 Cvejin   Eczema    Exertional dyspnea 10/29/2021   GERD (gastroesophageal reflux disease)    Hematuria 03/12/2022   Hyperlipidemia    Hypertension    Psoriasis    Stroke (HCC) 2011   left thalamic infarct   Past Surgical History:  Procedure Laterality Date   CERVICAL CERCLAGE     x 2, in 1995, 1997   CHOLECYSTECTOMY N/A 02/03/2015   Procedure: LAPAROSCOPIC CHOLECYSTECTOMY;  Surgeon: Alphonso Aschoff Kinsinger, MD;  Location: [facility] OR;  Service: General;  Laterality: N/A;   Family History  Problem Relation Age of Onset   Cancer Mother 50       breast cancer x 2   Cancer Father 65       pancreatic   Cancer Maternal Grandmother    Heart failure Maternal Grandfather    Outpatient Medications Prior to Visit  Medication Sig Dispense Refill   adalimumab  (HUMIRA , 2 PEN,) 40 MG/0.4ML pen Inject 0.4 mLs (40 mg total) into the skin as directed. Inject 0.4  mLs (40 mg total) into the skin every two weeks. Starting on day 29. 2 each 5   amLODipine  (NORVASC ) 5 MG tablet TAKE 1 TABLET (5 MG TOTAL) BY MOUTH DAILY. 90 tablet 1   aspirin  EC 81 MG tablet Take 81 mg by mouth daily.     Docusate Sodium (DULCOLAX STOOL SOFTENER PO) Take by mouth.     Evolocumab  (REPATHA  SURECLICK) 140 MG/ML SOAJ INJECT 140 MG INTO THE SKIN EVERY 14 (FOURTEEN) DAYS. 6 mL 3   Fluocinolone Acetonide Scalp 0.01 % OIL Apply topically.     omeprazole  (PRILOSEC) 40 MG capsule Take 40 mg by mouth daily.      triamcinolone  ointment (KENALOG ) 0.1 % Apply topically 2 (two) times daily.     valsartan  (DIOVAN ) 80 MG tablet Take 1 tablet (80 mg total) by mouth daily. 90 tablet 1   No facility-administered medications prior to visit.   Allergies  Allergen Reactions   Lisinopril  Cough and Other (See Comments)    Other Reaction(s): Cough (ALLERGY/intolerance)  Drip in her throat, coughing, and couldn't breathe   Risankizumab Rash, Swelling and Anaphylaxis     ROS: A complete ROS was performed with pertinent positives/negatives noted in the HPI. The remainder of the ROS are negative.    Objective:   Today's Vitals   07/17/23 1500  BP: (!) 150/91  Pulse: 85  SpO2: 99%  Weight: 211 lb 12.8 oz (96.1 kg)  Height: 5\' 3"  (1.6 m)  PainSc: 5   PainLoc: Abdomen    GENERAL: Well-appearing, in NAD. Well nourished.  SKIN: Pink, warm and dry.  Head: Normocephalic. NECK: Trachea midline. Full ROM w/o pain or tenderness. RESPIRATORY: Chest wall symmetrical. Respirations even and non-labored.  GI: Abdomen soft, non-tender. Normoactive bowel sounds. No rebound tenderness. No hepatomegaly or splenomegaly. No CVA tenderness.  GU: External genitalia without erythema, lesions, or masses. No lymphadenopathy. Vaginal mucosa pink and moist without exudate, lesions, or ulcerations. Cervix pink without discharge. Cervical os closed. Uterus and adnexae palpable, not enlarged, and w/o tenderness. No palpable masses.  No obvious prolapse.  Chaperoned by Karry Paganini, CMA.   MSK: Muscle tone and strength appropriate for age. Joints w/o tenderness, redness, or swelling.  EXTREMITIES: Without clubbing, cyanosis, or edema.  NEUROLOGIC: No motor or sensory deficits. Steady, even gait. C2-C12 intact.  PSYCH/MENTAL STATUS: Alert, oriented x 3. Cooperative, appropriate mood and affect.    Results for orders placed or performed in visit on 07/17/23  POCT URINALYSIS DIP (CLINITEK)  Result Value Ref Range   Color, UA yellow  yellow   Clarity, UA clear clear   Glucose, UA negative negative mg/dL   Bilirubin, UA negative negative   Ketones, POC UA negative negative mg/dL   Spec Grav, UA >=1.610 (A) 1.010 - 1.025   Blood, UA trace-intact (A) negative   pH, UA 5.5 5.0 - 8.0   POC PROTEIN,UA negative negative, trace   Urobilinogen, UA 0.2 0.2 or 1.0 E.U./dL   Nitrite, UA Negative Negative   Leukocytes, UA Negative Negative      Assessment & Plan:  1. Pelvic pain (Primary) Given ongoing left lower pelvic pain and history of fibroid, recommend ultrasound pelvis with transvaginal to rule out possible fibroid, cyst, mass that could be contributing to patient's pain and urinary frequency. - US  Pelvic Complete With Transvaginal; Future  2. Urinary frequency Urine positive for trace amount of blood today in office.  Will send urine off for culture and microscopic confirmation for hematuria.  Patient to  start Keflex  500 mg twice daily.  Discussed possible diagnoses including interstitial cystitis that may be contributing to patient's pain as she is menopausal.  Referral placed to urology for further evaluation.  - Ambulatory referral to Urology - Urine Culture - Urinalysis, Routine w reflex microscopic - cephALEXin  (KEFLEX ) 500 MG capsule; Take 1 capsule (500 mg total) by mouth 2 (two) times daily.  Dispense: 14 capsule; Refill: 0   Meds ordered this encounter  Medications   cephALEXin  (KEFLEX ) 500 MG capsule    Sig: Take 1 capsule (500 mg total) by mouth 2 (two) times daily.    Dispense:  14 capsule    Refill:  0    Supervising Provider:   DE [place], RAYMOND J [id number]   Lab Orders         Urine Culture         Urinalysis, Routine w reflex microscopic         POCT URINALYSIS DIP (CLINITEK)     Return if symptoms worsen or fail to improve.    Patient to reach out to office if new, worrisome, or unresolved symptoms arise or if no improvement in patient's condition. Patient verbalized understanding and is  agreeable to treatment plan. All questions answered to patient's satisfaction.    Nonda Bays, [place]

## 2023-07-18 LAB — URINALYSIS, ROUTINE W REFLEX MICROSCOPIC
Bilirubin, UA: NEGATIVE
Glucose, UA: NEGATIVE
Ketones, UA: NEGATIVE
Leukocytes,UA: NEGATIVE
Nitrite, UA: NEGATIVE
Protein,UA: NEGATIVE
RBC, UA: NEGATIVE
Specific Gravity, UA: 1.025 (ref 1.005–1.030)
Urobilinogen, Ur: 0.2 mg/dL (ref 0.2–1.0)
pH, UA: 5.5 (ref 5.0–7.5)

## 2023-07-19 ENCOUNTER — Ambulatory Visit (HOSPITAL_BASED_OUTPATIENT_CLINIC_OR_DEPARTMENT_OTHER)
Admission: RE | Admit: 2023-07-19 | Discharge: 2023-07-19 | Disposition: A | Discharge status: Home / Self Care | Payer: Medicare (Managed Care) | Source: Ambulatory Visit | Attending: Family Medicine | Admitting: Family Medicine

## 2023-07-19 ENCOUNTER — Encounter (HOSPITAL_BASED_OUTPATIENT_CLINIC_OR_DEPARTMENT_OTHER): Payer: Self-pay | Admitting: Family Medicine

## 2023-07-19 DIAGNOSIS — R102 Pelvic and perineal pain: Secondary | ICD-10-CM | POA: Insufficient documentation

## 2023-07-19 LAB — URINE CULTURE

## 2023-07-21 NOTE — Progress Notes (Signed)
 Urine is without signs of blood, protein, infection. Will proceed with Pelvic US  and referral. May change to urogynecology pending US .

## 2023-07-29 ENCOUNTER — Encounter (HOSPITAL_BASED_OUTPATIENT_CLINIC_OR_DEPARTMENT_OTHER): Payer: Self-pay | Admitting: Family Medicine

## 2023-08-01 ENCOUNTER — Encounter (HOSPITAL_BASED_OUTPATIENT_CLINIC_OR_DEPARTMENT_OTHER): Payer: Self-pay | Admitting: Family Medicine

## 2023-08-01 ENCOUNTER — Other Ambulatory Visit (HOSPITAL_BASED_OUTPATIENT_CLINIC_OR_DEPARTMENT_OTHER): Payer: Self-pay | Admitting: Family Medicine

## 2023-08-01 DIAGNOSIS — D251 Intramural leiomyoma of uterus: Secondary | ICD-10-CM

## 2023-08-01 DIAGNOSIS — R350 Frequency of micturition: Secondary | ICD-10-CM

## 2023-08-01 DIAGNOSIS — R102 Pelvic and perineal pain: Secondary | ICD-10-CM

## 2023-08-01 NOTE — Progress Notes (Signed)
 Hi Ashley Lewis, Looking at your ultrasound there are multiple fibroids noted to your uterus the largest measuring approximately 5 cm which is significant.  I have placed a referral to OB/[facility]N at [facility] on [address] in [place] to discuss options for management.  This likely has been contributing to your pelvic pain and possibly through the urine frequency.  You should be urine from their office within the next week to make an appointment.  If you have any further questions please let me know

## 2023-08-06 ENCOUNTER — Ambulatory Visit (HOSPITAL_BASED_OUTPATIENT_CLINIC_OR_DEPARTMENT_OTHER): Payer: Medicare (Managed Care) | Admitting: Physical Therapy

## 2023-08-19 ENCOUNTER — Encounter (HOSPITAL_BASED_OUTPATIENT_CLINIC_OR_DEPARTMENT_OTHER): Payer: Self-pay

## 2023-08-19 ENCOUNTER — Ambulatory Visit (HOSPITAL_BASED_OUTPATIENT_CLINIC_OR_DEPARTMENT_OTHER): Payer: Medicare (Managed Care) | Admitting: *Deleted

## 2023-08-19 DIAGNOSIS — Z0000 Encounter for general adult medical examination without abnormal findings: Secondary | ICD-10-CM

## 2023-08-19 NOTE — Patient Instructions (Signed)
 Ashley Lewis , Thank you for taking time to come for your Medicare Wellness Visit. I appreciate your ongoing commitment to your health goals. Please review the following plan we discussed and let me know if I can assist you in the future.   Screening recommendations/referrals: Colonoscopy: up to date Mammogram: up to date  Recommended yearly ophthalmology/optometry visit for glaucoma screening and checkup Recommended yearly dental visit for hygiene and checkup  Vaccinations: Influenza vaccine:  Tdap vaccine: up to date Shingles vaccine: Education provide   Preventive Care 40-64 and Older, Female Preventive care refers to lifestyle choices and visits with your health care provider that can promote health and wellness. What does preventive care include? A yearly physical exam. This is also called an annual well check. Dental exams once or twice a year. Routine eye exams. Ask your health care provider how often you should have your eyes checked. Personal lifestyle choices, including: Daily care of your teeth and gums. Regular physical activity. Eating a healthy diet. Avoiding tobacco and drug use. Limiting alcohol use. Practicing safe sex. Taking low-dose aspirin  every day. Taking vitamin and mineral supplements as recommended by your health care provider. What happens during an annual well check? The services and screenings done by your health care provider during your annual well check will depend on your age, overall health, lifestyle risk factors, and family history of disease. Counseling  Your health care provider may ask you questions about your: Alcohol use. Tobacco use. Drug use. Emotional well-being. Home and relationship well-being. Sexual activity. Eating habits. History of falls. Memory and ability to understand (cognition). Work and work Astronomer. Reproductive health. Screening  You may have the following tests or measurements: Height, weight, and  BMI. Blood pressure. Lipid and cholesterol levels. These may be checked every 5 years, or more frequently if you are over 82 years old. Skin check. Lung cancer screening. You may have this screening every year starting at age 41 if you have a 30-pack-year history of smoking and currently smoke or have quit within the past 15 years. Fecal occult blood test (FOBT) of the stool. You may have this test every year starting at age 21. Flexible sigmoidoscopy or colonoscopy. You may have a sigmoidoscopy every 5 years or a colonoscopy every 10 years starting at age 66. Hepatitis C blood test. Hepatitis B blood test. Sexually transmitted disease (STD) testing. Diabetes screening. This is done by checking your blood sugar (glucose) after you have not eaten for a while (fasting). You may have this done every 1-3 years. Bone density scan. This is done to screen for osteoporosis. You may have this done starting at age 22. Mammogram. This may be done every 1-2 years. Talk to your health care provider about how often you should have regular mammograms. Talk with your health care provider about your test results, treatment options, and if necessary, the need for more tests. Vaccines  Your health care provider may recommend certain vaccines, such as: Influenza vaccine. This is recommended every year. Tetanus, diphtheria, and acellular pertussis (Tdap, Td) vaccine. You may need a Td booster every 10 years. Zoster vaccine. You may need this after age 44. Pneumococcal 13-valent conjugate (PCV13) vaccine. One dose is recommended after age 72. Pneumococcal polysaccharide (PPSV23) vaccine. One dose is recommended after age 49. Talk to your health care provider about which screenings and vaccines you need and how often you need them. This information is not intended to replace advice given to you by your health care provider. Make  sure you discuss any questions you have with your health care provider. Document  Released: 04/07/2015 Document Revised: 11/29/2015 Document Reviewed: 01/10/2015 Elsevier Interactive Patient Education  2017 [employer].  Fall Prevention in the Home Falls can cause injuries. They can happen to people of all ages. There are many things you can do to make your home safe and to help prevent falls. What can I do on the outside of my home? Regularly fix the edges of walkways and driveways and fix any cracks. Remove anything that might make you trip as you walk through a door, such as a raised step or threshold. Trim any bushes or trees on the path to your home. Use bright outdoor lighting. Clear any walking paths of anything that might make someone trip, such as rocks or tools. Regularly check to see if handrails are loose or broken. Make sure that both sides of any steps have handrails. Any raised decks and porches should have guardrails on the edges. Have any leaves, snow, or ice cleared regularly. Use sand or salt on walking paths during winter. Clean up any spills in your garage right away. This includes oil or grease spills. What can I do in the bathroom? Use night lights. Install grab bars by the toilet and in the tub and shower. Do not use towel bars as grab bars. Use non-skid mats or decals in the tub or shower. If you need to sit down in the shower, use a plastic, non-slip stool. Keep the floor dry. Clean up any water that spills on the floor as soon as it happens. Remove soap buildup in the tub or shower regularly. Attach bath mats securely with double-sided non-slip rug tape. Do not have throw rugs and other things on the floor that can make you trip. What can I do in the bedroom? Use night lights. Make sure that you have a light by your bed that is easy to reach. Do not use any sheets or blankets that are too big for your bed. They should not hang down onto the floor. Have a firm chair that has side arms. You can use this for support while you get dressed. Do  not have throw rugs and other things on the floor that can make you trip. What can I do in the kitchen? Clean up any spills right away. Avoid walking on wet floors. Keep items that you use a lot in easy-to-reach places. If you need to reach something above you, use a strong step stool that has a grab bar. Keep electrical cords out of the way. Do not use floor polish or wax that makes floors slippery. If you must use wax, use non-skid floor wax. Do not have throw rugs and other things on the floor that can make you trip. What can I do with my stairs? Do not leave any items on the stairs. Make sure that there are handrails on both sides of the stairs and use them. Fix handrails that are broken or loose. Make sure that handrails are as long as the stairways. Check any carpeting to make sure that it is firmly attached to the stairs. Fix any carpet that is loose or worn. Avoid having throw rugs at the top or bottom of the stairs. If you do have throw rugs, attach them to the floor with carpet tape. Make sure that you have a light switch at the top of the stairs and the bottom of the stairs. If you do not have them, ask  someone to add them for you. What else can I do to help prevent falls? Wear shoes that: Do not have high heels. Have rubber bottoms. Are comfortable and fit you well. Are closed at the toe. Do not wear sandals. If you use a stepladder: Make sure that it is fully opened. Do not climb a closed stepladder. Make sure that both sides of the stepladder are locked into place. Ask someone to hold it for you, if possible. Clearly mark and make sure that you can see: Any grab bars or handrails. First and last steps. Where the edge of each step is. Use tools that help you move around (mobility aids) if they are needed. These include: Canes. Walkers. Scooters. Crutches. Turn on the lights when you go into a dark area. Replace any light bulbs as soon as they burn out. Set up your  furniture so you have a clear path. Avoid moving your furniture around. If any of your floors are uneven, fix them. If there are any pets around you, be aware of where they are. Review your medicines with your doctor. Some medicines can make you feel dizzy. This can increase your chance of falling. Ask your doctor what other things that you can do to help prevent falls. This information is not intended to replace advice given to you by your health care provider. Make sure you discuss any questions you have with your health care provider. Document Released: 01/05/2009 Document Revised: 08/17/2015 Document Reviewed: 04/15/2014 Elsevier Interactive Patient Education  2017 [employer].

## 2023-08-19 NOTE — Progress Notes (Signed)
 Subjective:   Ashley Lewis is a 58 y.o. female who presents for Medicare Annual (Subsequent) preventive examination.  Visit Complete: Virtual I connected with  Ashley Lewis on 08/19/23 by a audio enabled telemedicine application and verified that I am speaking with the correct person using two identifiers.  Patient Location: Home  Provider Location: Home Office  I discussed the limitations of evaluation and management by telemedicine. The patient expressed understanding and agreed to proceed.  Vital Signs: Because this visit was a virtual/telehealth visit, some criteria may be missing or patient reported. Any vitals not documented were not able to be obtained and vitals that have been documented are patient reported.  Patient Medicare AWV questionnaire was completed by the patient on 08-12-2023; I have confirmed that all information answered by patient is correct and no changes since this date.  Cardiac Risk Factors include: advanced age (>59men, >29 women);hypertension     Objective:     There were no vitals filed for this visit. There is no height or weight on file to calculate BMI.     08/19/2023   11:47 AM 10/26/2022   11:44 AM 08/13/2022   11:53 AM 04/24/2022    7:33 PM 12/14/2020    5:26 PM 09/09/2016    7:04 AM 02/04/2015    9:00 AM  Advanced Directives  Does Patient Have a Medical Advance Directive? No No No No No No No  Would patient like information on creating a medical advance directive? No - Patient declined  No - Patient declined  No - Patient declined      Current Medications (verified) Outpatient Encounter Medications as of 08/19/2023  Medication Sig   adalimumab  (HUMIRA , 2 PEN,) 40 MG/0.4ML pen Inject 0.4 mLs (40 mg total) into the skin as directed. Inject 0.4 mLs (40 mg total) into the skin every two weeks. Starting on day 29.   amLODipine  (NORVASC ) 5 MG tablet TAKE 1 TABLET (5 MG TOTAL) BY MOUTH DAILY.   aspirin  EC 81 MG tablet Take 81 mg by mouth  daily.   cephALEXin  (KEFLEX ) 500 MG capsule Take 1 capsule (500 mg total) by mouth 2 (two) times daily.   Docusate Sodium (DULCOLAX STOOL SOFTENER PO) Take by mouth.   Evolocumab  (REPATHA  SURECLICK) 140 MG/ML SOAJ INJECT 140 MG INTO THE SKIN EVERY 14 (FOURTEEN) DAYS.   Fluocinolone Acetonide Scalp 0.01 % OIL Apply topically.   omeprazole  (PRILOSEC) 40 MG capsule Take 40 mg by mouth daily.   triamcinolone  ointment (KENALOG ) 0.1 % Apply topically 2 (two) times daily.   valsartan  (DIOVAN ) 80 MG tablet Take 1 tablet (80 mg total) by mouth daily.   No facility-administered encounter medications on file as of 08/19/2023.    Allergies (verified) Lisinopril  and Risankizumab   History: Past Medical History:  Diagnosis Date   Amenorrhea 08/12/2012   Anemia    iron defi   Atypical chest pain 08/05/2019   Depression    sees Dr. Cvejin   Eczema    Exertional dyspnea 10/29/2021   GERD (gastroesophageal reflux disease)    Hematuria 03/12/2022   Hyperlipidemia    Hypertension    Psoriasis    Stroke [facility]) 2011   left thalamic infarct   Past Surgical History:  Procedure Laterality Date   CERVICAL CERCLAGE     x 2, in 1995, 1997   CHOLECYSTECTOMY N/A 02/03/2015   Procedure: LAPAROSCOPIC CHOLECYSTECTOMY;  Surgeon: Derral Flick, MD;  Location: [facility] OR;  Service: General;  Laterality: N/A;  Family History  Problem Relation Age of Onset   Cancer Mother 46       breast cancer x 2   Cancer Father 17       pancreatic   Cancer Maternal Grandmother    Heart failure Maternal Grandfather    Social History   Socioeconomic History   Marital status: Single    Spouse name: Not on file   Number of children: Not on file   Years of education: Not on file   Highest education level: 12th grade  Occupational History   Not on file  Tobacco Use   Smoking status: Former    Current packs/day: 0.00    Average packs/day: 0.5 packs/day for 1.1 years (0.5 ttl pk-yrs)    Types: Cigarettes     Start date: 03/25/2009    Quit date: 04/14/2010    Years since quitting: 13.3    Passive exposure: Past   Smokeless tobacco: Never  Vaping Use   Vaping status: Former  Substance and Sexual Activity   Alcohol use: No   Drug use: Not Currently    Types: Marijuana   Sexual activity: Yes  Other Topics Concern   Not on file  Social History Narrative   Divorced, mother of 4 children, 3 still live with patient. Has 66 yo daughter with severe MR and special needs.    Does not work outside home. Working to try for disability, has lawyers currently.            Epworth Sleepiness Scale = 6 (as of 05/26/2015)   Social Drivers of Health   Financial Resource Strain: Low Risk  (08/19/2023)   Overall Financial Resource Strain (CARDIA)    Difficulty of Paying Living Expenses: Not hard at all  Food Insecurity: No Food Insecurity (08/19/2023)   Hunger Vital Sign    Worried About Running Out of Food in the Last Year: Never true    Ran Out of Food in the Last Year: Never true  Transportation Needs: No Transportation Needs (08/19/2023)   PRAPARE - Administrator, Civil Service (Medical): No    Lack of Transportation (Non-Medical): No  Physical Activity: Inactive (08/19/2023)   Exercise Vital Sign    Days of Exercise per Week: 0 days    Minutes of Exercise per Session: 20 min  Stress: Stress Concern Present (08/19/2023)   [employer] of Occupational Health - Occupational Stress Questionnaire    Feeling of Stress : Rather much  Social Connections: Moderately Integrated (08/19/2023)   Social Connection and Isolation Panel [NHANES]    Frequency of Communication with Friends and Family: More than three times a week    Frequency of Social Gatherings with Friends and Family: Once a week    Attends Religious Services: More than 4 times per year    Active Member of [employer] or Organizations: Yes    Attends Engineer, structural: More than 4 times per year    Marital Status: Divorced     Tobacco Counseling Counseling given: Not Answered   Clinical Intake:  Pre-visit preparation completed: Yes  Pain : No/denies pain     Diabetes: No  How often do you need to have someone help you when you read instructions, pamphlets, or other written materials from your doctor or pharmacy?: 1 - Never  Interpreter Needed?: No  Information entered by :: Kieth Pelt LPN   Activities of Daily Living    08/19/2023   11:50 AM 08/12/2023    9:32  AM  In your present state of health, do you have any difficulty performing the following activities:  Hearing? 0 0  Vision? 0 0  Difficulty concentrating or making decisions? 0 0  Walking or climbing stairs? 0 0  Dressing or bathing? 0 0  Doing errands, shopping? 0 0  Preparing Food and eating ? N N  Using the Toilet? N N  In the past six months, have you accidently leaked urine? Y Y  Do you have problems with loss of bowel control? N N  Managing your Medications? N N  Managing your Finances? N N  Housekeeping or managing your Housekeeping? N N    Patient Care Team: Nonda Bays, FNP as PCP - General (Family Medicine) Maudine Sos, MD as PCP - Cardiology (Cardiology) Maudine Sos, MD as Attending Physician (Cardiology) Elta Halter, MD (Dermatology) Alvis Jourdain, MD as Consulting Physician (Gastroenterology) Marien Short, MD as Referring Physician (Internal Medicine) Horace Lye, OD (Optometry)  Indicate any recent Medical Services you may have received from other than [facility] providers in the past year (date may be approximate).     Assessment:    This is a routine wellness examination for Shevy.  Hearing/Vision screen Hearing Screening - Comments:: No trouble hearing Vision Screening - Comments:: [facility] Up to date   Goals Addressed             This Visit's Progress    Patient Stated   Not on track    Patient's goal is to increase her water intake and eat more  fruits and vegetables     Patient Stated       No goals at this time       Depression Screen    08/19/2023   11:51 AM 06/04/2023    2:03 PM 12/02/2022    1:48 PM 08/22/2022    2:40 PM 08/13/2022   11:46 AM 04/09/2022    1:42 PM 03/12/2022   10:41 AM  PHQ 2/9 Scores  PHQ - 2 Score 4 2 2  0 2 2 0  PHQ- 9 Score 19 7 6  0 7 6 0  Exception Documentation    Medical reason  Medical reason Medical reason    Fall Risk    08/12/2023    9:32 AM 07/17/2023    3:08 PM 06/04/2023    2:03 PM 12/02/2022    1:47 PM 08/22/2022    2:40 PM  Fall Risk   Falls in the past year? 1 0 0 0 0  Number falls in past yr: 0 0 0 0 0  Injury with Fall? 0 0 0 0 0  Risk for fall due to :  No Fall Risks No Fall Risks No Fall Risks No Fall Risks  Follow up  Falls evaluation completed Falls evaluation completed Falls evaluation completed Falls evaluation completed    MEDICARE RISK AT HOME: Medicare Risk at Home Any stairs in or around the home?: No If so, are there any without handrails?: No Home free of loose throw rugs in walkways, pet beds, electrical cords, etc?: Yes Adequate lighting in your home to reduce risk of falls?: Yes Life alert?: No Use of a cane, walker or w/c?: No Grab bars in the bathroom?: Yes Shower chair or bench in shower?: No Elevated toilet seat or a handicapped toilet?: No  TIMED UP AND GO:  Was the test performed?  No    Cognitive Function:        08/19/2023  11:50 AM 08/13/2022   11:57 AM  6CIT Screen  What Year? 0 points 0 points  What month? 0 points 0 points  What time? 0 points 0 points  Count back from 20 0 points 0 points  Months in reverse 0 points 0 points  Repeat phrase 0 points 0 points  Total Score 0 points 0 points    Immunizations Immunization History  Administered Date(s) Administered   Td 11/16/2009   Tdap 09/28/2020    TDAP status: Up to date  Flu Vaccine status: Up to date  Covid-19 vaccine status: Information provided on how to obtain vaccines.    Qualifies for Shingles Vaccine? Yes   Zostavax completed No   Shingrix Completed?: No.    Education has been provided regarding the importance of this vaccine. Patient has been advised to call insurance company to determine out of pocket expense if they have not yet received this vaccine. Advised may also receive vaccine at local pharmacy or Health Dept. Verbalized acceptance and understanding.  Screening Tests Health Maintenance  Topic Date Due   COVID-19 Vaccine (1) 09/22/2023 (Originally 11/22/1970)   Zoster Vaccines- Shingrix (1 of 2) 11/24/2023 (Originally 11/21/1984)   INFLUENZA VACCINE  10/24/2023   MAMMOGRAM  10/29/2023   Medicare Annual Wellness (AWV)  08/18/2024   Cervical Cancer Screening (HPV/Pap Cotest)  08/23/2026   DTaP/Tdap/Td (3 - Td or Tdap) 09/29/2030   Colonoscopy  06/12/2032   Hepatitis C Screening  Completed   HIV Screening  Completed   HPV VACCINES  Aged Out   Meningococcal B Vaccine  Aged Out    Health Maintenance  There are no preventive care reminders to display for this patient.   Colorectal cancer screening: Type of screening: Colonoscopy. Completed 2024. Repeat every 10 years  per patient Dr. Nickey Barn  Mammogram status: Completed  . Repeat every year   Lung Cancer Screening: (Low Dose CT Chest recommended if Age 58-80 years, 20 pack-year currently smoking OR have quit w/in 15years.) does not qualify.   Lung Cancer Screening Referral:   Additional Screening:  Hepatitis C Screening: does not qualify; Completed 2024  Vision Screening: Recommended annual ophthalmology exams for early detection of glaucoma and other disorders of the eye. Is the patient up to date with their annual eye exam?  Yes  Who is the provider or what is the name of the office in which the patient attends annual eye exams? [facility] If pt is not established with a provider, would they like to be referred to a provider to establish care? No .   Dental Screening: Recommended annual  dental exams for proper oral hygiene    Community Resource Referral / Chronic Care Management: CRR required this visit?  No   CCM required this visit?  No     Plan:     I have personally reviewed and noted the following in the patient's chart:   Medical and social history Use of alcohol, tobacco or illicit drugs  Current medications and supplements including opioid prescriptions. Patient is not currently taking opioid prescriptions. Functional ability and status Nutritional status Physical activity Advanced directives List of other physicians Hospitalizations, surgeries, and ER visits in previous 12 months Vitals Screenings to include cognitive, depression, and falls Referrals and appointments  In addition, I have reviewed and discussed with patient certain preventive protocols, quality metrics, and best practice recommendations. A written personalized care plan for preventive services as well as general preventive health recommendations were provided to patient.  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   She was feeling overwhelmed today.   Discussed if patient wanted to schedule an appointment she stated no she was just feeling tired.   Advised if she needs to be seen to call and schedule an appointment.   Voiced understanding

## 2023-08-20 ENCOUNTER — Ambulatory Visit: Payer: Medicare (Managed Care) | Admitting: Urology

## 2023-08-27 ENCOUNTER — Other Ambulatory Visit: Payer: Self-pay | Admitting: Dermatology

## 2023-09-02 ENCOUNTER — Encounter: Payer: Self-pay | Admitting: Dermatology

## 2023-09-02 ENCOUNTER — Ambulatory Visit: Payer: Medicare (Managed Care) | Admitting: Dermatology

## 2023-09-02 DIAGNOSIS — B360 Pityriasis versicolor: Secondary | ICD-10-CM | POA: Diagnosis not present

## 2023-09-02 DIAGNOSIS — L811 Chloasma: Secondary | ICD-10-CM

## 2023-09-02 DIAGNOSIS — S90562A Insect bite (nonvenomous), left ankle, initial encounter: Secondary | ICD-10-CM

## 2023-09-02 DIAGNOSIS — W57XXXA Bitten or stung by nonvenomous insect and other nonvenomous arthropods, initial encounter: Secondary | ICD-10-CM | POA: Diagnosis not present

## 2023-09-02 DIAGNOSIS — Z79899 Other long term (current) drug therapy: Secondary | ICD-10-CM

## 2023-09-02 DIAGNOSIS — L650 Telogen effluvium: Secondary | ICD-10-CM

## 2023-09-02 DIAGNOSIS — S60562A Insect bite (nonvenomous) of left hand, initial encounter: Secondary | ICD-10-CM | POA: Diagnosis not present

## 2023-09-02 DIAGNOSIS — L4050 Arthropathic psoriasis, unspecified: Secondary | ICD-10-CM | POA: Diagnosis not present

## 2023-09-02 DIAGNOSIS — Z7189 Other specified counseling: Secondary | ICD-10-CM

## 2023-09-02 DIAGNOSIS — S60869A Insect bite (nonvenomous) of unspecified wrist, initial encounter: Secondary | ICD-10-CM

## 2023-09-02 DIAGNOSIS — S60561A Insect bite (nonvenomous) of right hand, initial encounter: Secondary | ICD-10-CM

## 2023-09-02 DIAGNOSIS — L409 Psoriasis, unspecified: Secondary | ICD-10-CM | POA: Diagnosis not present

## 2023-09-02 DIAGNOSIS — S90561A Insect bite (nonvenomous), right ankle, initial encounter: Secondary | ICD-10-CM | POA: Diagnosis not present

## 2023-09-02 MED ORDER — KETOCONAZOLE 2 % EX SHAM: 1.0000 | MEDICATED_SHAMPOO | CUTANEOUS | 2 refills | Status: AC

## 2023-09-02 MED ORDER — NIACINAMIDE POWD: 1.0000 | Freq: Every day | 3 refills | Status: AC

## 2023-09-02 NOTE — Patient Instructions (Addendum)
 Tinea versicolor is a chronic recurrent skin rash causing discolored scaly spots most commonly seen on back, chest, and/or shoulders.  It is generally asymptomatic. The rash is due to overgrowth of a common type of yeast present on everyone's skin and it is not contagious.  It tends to flare more in the summer due to increased sweating on trunk.  After rash is treated, the scaliness will resolve, but the discoloration will take longer to return to normal pigmentation. The periodic use of an OTC medicated soap/shampoo with zinc or selenium sulfide can be helpful to prevent yeast overgrowth and recurrence.   For the Tinea versicolor on neck Start Ketoconazole 2% shampoo to face and neck 2 times a week, let sit 5 minutes and rinse off Start Ketoconazole 2% shampoo to scalp 1 time a week, let sit 5 minutes and rinse off  For Melasma on face (dark spots) Start [facility] Hydroquinone 12% / Kojic Acid 6% / Niacinamide 2% / Vitamin C 1% Cream nightly to dark spots.  May use for 3 months on then take 3 months off, then restart 3 months on 3 months off  Instructions for [facility] Medications  One or more of your medications was sent to the [facility] mail order compounding pharmacy. You will receive an email from them and can purchase the medicine through that link. It will then be mailed to your home at the address you confirmed. If for any reason you do not receive an email from them, please check your spam folder. If you still do not find the email, please let us  know. [facility] phone number is [phone].      Due to recent changes in healthcare laws, you may see results of your pathology and/or laboratory studies on MyChart before the doctors have had a chance to review them. We understand that in some cases there may be results that are confusing or concerning to you. Please understand that not all results are received at the same time and often the doctors may need to interpret  multiple results in order to provide you with the best plan of care or course of treatment. Therefore, we ask that you please give us  2 business days to thoroughly review all your results before contacting the office for clarification. Should we see a critical lab result, you will be contacted sooner.   If You Need Anything After Your Visit  If you have any questions or concerns for your doctor, please call our main line at [phone] and press option 4 to reach your doctor's medical assistant. If no one answers, please leave a voicemail as directed and we will return your call as soon as possible. Messages left after 4 pm will be answered the following business day.   You may also send us  a message via MyChart. We typically respond to MyChart messages within 1-2 business days.  For prescription refills, please ask your pharmacy to contact our office. Our fax number is [phone].  If you have an urgent issue when the clinic is closed that cannot wait until the next business day, you can page your doctor at the number below.    Please note that while we do our best to be available for urgent issues outside of office hours, we are not available 24/7.   If you have an urgent issue and are unable to reach us , you may choose to seek medical care at your doctor's office, retail clinic, urgent care center, or emergency room.  If you have a medical emergency, please immediately call 911 or go to the emergency department.  Pager Numbers  - Dr. Bary Likes: [phone]  - Dr. Annette Barters: [phone]  - Dr. Felipe Horton: [phone]   In the event of inclement weather, please call our main line at [phone] for an update on the status of any delays or closures.  Dermatology Medication Tips: Please keep the boxes that topical medications come in in order to help keep track of the instructions about where and how to use these. Pharmacies typically print the medication instructions only on the boxes  and not directly on the medication tubes.   If your medication is too expensive, please contact our office at [phone] option 4 or send us  a message through MyChart.   We are unable to tell what your co-pay for medications will be in advance as this is different depending on your insurance coverage. However, we may be able to find a substitute medication at lower cost or fill out paperwork to get insurance to cover a needed medication.   If a prior authorization is required to get your medication covered by your insurance company, please allow us  1-2 business days to complete this process.  Drug prices often vary depending on where the prescription is filled and some pharmacies may offer cheaper prices.  The website www.goodrx.com contains coupons for medications through different pharmacies. The prices here do not account for what the cost may be with help from insurance (it may be cheaper with your insurance), but the website can give you the price if you did not use any insurance.  - You can print the associated coupon and take it with your prescription to the pharmacy.  - You may also stop by our office during regular business hours and pick up a GoodRx coupon card.  - If you need your prescription sent electronically to a different pharmacy, notify our office through [place] or by phone at [phone] option 4.     Si Usted Necesita Algo Despus de Su Visita  Tambin puede enviarnos un mensaje a travs de Clinical cytogeneticist. Por lo general respondemos a los mensajes de MyChart en el transcurso de 1 a 2 das hbiles.  Para renovar recetas, por favor pida a su farmacia que se ponga en contacto con nuestra oficina. Franz Jacks de fax es [place] [phone].  Si tiene un asunto urgente cuando la clnica est cerrada y que no puede esperar hasta el siguiente da hbil, puede llamar/localizar a su doctor(a) al nmero que aparece a continuacin.   Por favor, tenga en cuenta que aunque  hacemos todo lo posible para estar disponibles para asuntos urgentes fuera del horario de [place], no estamos disponibles las 24 horas del da, los 7 [address]  [address] de la [place].   Si tiene un problema urgente y no puede comunicarse con nosotros, puede optar por buscar atencin mdica  en el consultorio de su doctor(a), en una clnica privada, en un centro de atencin urgente o en una sala de emergencias.  Si tiene Engineer, drilling, por favor llame inmediatamente al 911 o vaya a la sala de emergencias.  Nmeros de bper  - Dr. Bary Likes: [phone]  - Dra. Annette Barters: [phone]  - Dr. Felipe Horton: [phone]   En caso de inclemencias del tiempo, por favor llame a Lajuan Pila principal al [phone] para una actualizacin sobre el [place] de cualquier retraso o cierre.  Consejos para la medicacin en dermatologa: Por favor, guarde las cajas en las que vienen los  medicamentos de uso tpico para ayudarle a seguir las [employer] dnde y cmo usarlos. Las farmacias generalmente imprimen las instrucciones del medicamento slo en las cajas y no directamente en los tubos del [place].   Si su medicamento es muy caro, por favor, pngase en contacto con Bettyjane Brunet llamando al [phone] y presione la opcin 4 o envenos un mensaje a travs de Clinical cytogeneticist.   No podemos decirle cul ser su copago por los medicamentos por adelantado ya que esto es diferente dependiendo de la cobertura de su seguro. Sin embargo, es posible que podamos encontrar un medicamento sustituto a Audiological scientist un formulario para que el seguro cubra el medicamento que se considera necesario.   Si se requiere una autorizacin previa para que su compaa de seguros [place] su medicamento, por favor permtanos de 1 a 2 das hbiles para completar este proceso.  Los precios de los medicamentos varan con frecuencia dependiendo del Environmental consultant de dnde se surte la receta y alguna farmacias pueden ofrecer precios ms  baratos.  El sitio web www.goodrx.com tiene cupones para medicamentos de Health and safety inspector. Los precios aqu no tienen en cuenta lo que podra costar con la ayuda del seguro (puede ser ms barato con su seguro), pero el sitio web puede darle el precio si no utiliz Tourist information centre manager.  - Puede imprimir el cupn correspondiente y llevarlo con su receta a la farmacia.  - Tambin puede pasar por nuestra oficina durante el horario de atencin regular y Education officer, museum una tarjeta de cupones de GoodRx.  - Si necesita que su receta se enve electrnicamente a una farmacia diferente, informe a nuestra oficina a travs de MyChart de [place] o por telfono llamando al [phone] y presione la opcin 4.

## 2023-09-02 NOTE — Progress Notes (Signed)
 Follow-Up Visit   Subjective  Ashley Lewis is a 58 y.o. female who presents for the following: Psoriasis, no active areas today 70m f/u, Humira  40mg  sq injections q 2 wks, pt states she has seen a rheumatologist a few yrs ago Hair loss when she washes hair, 5m, no recent illnesses, no major stressors, no new medications, Neck itching ~70m, started Panoxyle wash on neck x 1 wk, Wrist and ankles itching ~3 days, Benadryl cr, hx of scabies 2013, pt concerned may be scabies again The patient has spots, moles and lesions to be evaluated, some may be new or changing and the patient may have concern these could be cancer.  The following portions of the chart were reviewed this encounter and updated as appropriate: medications, allergies, medical history  Review of Systems:  No other skin or systemic complaints except as noted in HPI or Assessment and Plan.  Objective  Well appearing patient in no apparent distress; mood and affect are within normal limits.   A focused examination was performed of the following areas: Scalp, neck, arms, legs  Relevant exam findings are noted in the Assessment and Plan.  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 Started Humira  06/2022 Chronic and persistent condition with duration or expected duration over one year. Condition is improving with treatment but not currently at goal. Patient with history of joint pain but Humira  has helped.  Discussed referral to Rheumatology, pt declines at this time.  Labs Due = TB/ Quantiferon gold  Psoriasis - severe on systemic treatment.  Psoriasis is a chronic non-curable, but treatable genetic/hereditary disease that may have other systemic features affecting other organ systems such as joints (Psoriatic Arthritis).  It is linked with heart disease, inflammatory bowel disease, non-alcoholic fatty liver disease,  and depression. Significant skin psoriasis and/or psoriatic arthritis may have significant symptoms and affects activities of daily activity and often benefits from systemic treatments.  These systemic treatments have some potential side effects including immunosuppression and require pre-treatment laboratory screening and periodic laboratory monitoring and periodic in person evaluation and monitoring by the attending dermatologist physician (long term medication management).   Reviewed risks of biologics including immunosuppression, infections, injection site reaction, and failure to improve condition. Goal is control of skin condition, not cure.  Some older biologics such as Humira  and Enbrel may slightly increase risk of malignancy and may worsen congestive heart failure.  Taltz and Cosentyx may cause inflammatory bowel disease to flare. The use of biologics requires long term medication management, including periodic office visits and monitoring of blood work.   Treatment Plan: Pending labs cont Humira  40mg  sq injections q 2 weeks  Long term medication management.  Patient is using long term (months to years) prescription medication  to control their dermatologic condition.  These medications require periodic monitoring to evaluate for efficacy and side effects and may require periodic laboratory monitoring.    MELASMA Face  Exam: reticulated hyperpigmented patches at cheeks, nose see photos Chronic and persistent condition with duration or expected duration over one year. Condition is bothersome/symptomatic for patient. Currently flared. Melasma is a chronic; persistent condition of hyperpigmented patches generally on the face, worse in summer due to higher UV exposure.    Heredity; thyroid  disease; sun exposure; pregnancy; birth control pills; epilepsy medication and darker skin may predispose to Melasma.   Recommendations include: - Sun  avoidance and daily broad spectrum (UVA/UVB) tinted  mineral sunscreen SPF 30+, with Zinc or Titanium Dioxide. - Rx topical bleaching creams (i.e. hydroquinone) is a common treatment but should not be used long term.  Hydroquinones may be mixed with retinoids; vitamin C; steroids; Kojic Acid. - Alastin A-luminate, retinoids, vitamin C, topical tranexamic acid, glycolic acid and kojic acid can be used for brightening while on break from hydroquinone - Rx Azelaic Acid is also a treatment option that is safe for pregnancy (Category B). - OTC Heliocare can be helpful in control and prevention. - Oral Rx with Tranexamic Acid 250 mg - 650 mg po daily can be used for moderate to severe cases especially during summer (contraindications include pregnancy; lactation; hx of PE; hx of DVT; clotting disorder; heart disease; anticoagulant use and upcoming long trips)   - Chemical peels (would need multiple for best result).  - Lasers and  Microdermabrasion may also be helpful adjunct treatments.  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 Condition is bothersome/symptomatic for patient. Currently flared. Tinea versicolor is a chronic recurrent skin rash causing discolored scaly spots most commonly seen on back, chest, and/or shoulders.  It is generally asymptomatic. The rash is due to overgrowth of a common type of yeast present on everyone's skin and it is not contagious.  It tends to flare more in the summer due to increased sweating on trunk.  After rash is treated, the scaliness will resolve, but the discoloration will take longer to return to normal pigmentation. The periodic use of an OTC medicated  soap/shampoo with zinc or selenium sulfide can be helpful to prevent yeast overgrowth and recurrence. Treatment: Start Ketoconazole to wash face, neck 2x/wk and scalp 1x/wk, let sit 5 minutes and wash out  TELOGEN EFFLUVIUM scalp Exam: Diffuse thinning of hair, positive hair pull test. Telogen effluvium is a benign, self-limited condition causing increased hair shedding usually for several months. It does not progress to baldness, and the hair eventually grows back on its own. It can be triggered by recent illness, recent surgery, thyroid  disease, low iron stores, vitamin D deficiency, fad diets or rapid weight loss, hormonal changes such as pregnancy or birth control pills, and some medication. Usually the hair loss starts 2-3 months after the illness or health change. Rarely, it can continue for longer than a year. Treatments options may include oral or topical Minoxidil; Red Light scalp treatments; Biotin 2.5 mg daily and other options. Treatment Plan: No treatment at this time, re-evaluate on f/u  PSORIASIS   Related Procedures QuantiFERON-TB Gold Plus  Return in about 4 months (around 01/02/2024) for Psoriasis f/u, TV f/u, Melasma f/u, Telogen Effluvium f/u.  I, Rollie Clipper, RMA, am acting as scribe for Celine Collard, MD .   Documentation: I have reviewed the above documentation for accuracy and completeness, and I agree with the above.  Celine Collard, MD

## 2023-09-11 DIAGNOSIS — L409 Psoriasis, unspecified: Secondary | ICD-10-CM | POA: Diagnosis not present

## 2023-09-16 ENCOUNTER — Ambulatory Visit: Payer: Self-pay | Admitting: Dermatology

## 2023-09-16 LAB — QUANTIFERON-TB GOLD PLUS
QuantiFERON Mitogen Value: >10 [IU]/mL
QuantiFERON Nil Value: 0.08 [IU]/mL
QuantiFERON TB1 Ag Value: 0.11 [IU]/mL
QuantiFERON TB2 Ag Value: 0.1 [IU]/mL
QuantiFERON-TB Gold Plus: NEGATIVE

## 2023-09-17 MED ORDER — HUMIRA (2 PEN) 40 MG/0.4ML ~~LOC~~ AJKT: 40.0000 mg | AUTO-INJECTOR | SUBCUTANEOUS | 5 refills | Status: DC

## 2023-09-17 NOTE — Telephone Encounter (Signed)
 Left message advising patient that labs came back ok, and refills were sent to pharmacy.

## 2023-09-17 NOTE — Telephone Encounter (Signed)
-----   Message from Alm Rhyme sent at 09/16/2023  3:36 PM EDT ----- Lab - quatiferon gold / TB test on 09/11/2023 was Negative / Normal.  Pt on Humira  for Psoriasis  Keep follow up appt. If Humira  not already sent to pharmacy, please send  ----- Message ----- From: Interface, [facility] Lab Results In Sent: 09/16/2023  11:35 AM EDT To: [name], MD

## 2023-10-04 ENCOUNTER — Other Ambulatory Visit (HOSPITAL_BASED_OUTPATIENT_CLINIC_OR_DEPARTMENT_OTHER): Payer: Self-pay | Admitting: Family Medicine

## 2023-10-14 ENCOUNTER — Encounter: Payer: Self-pay | Admitting: *Deleted

## 2023-10-15 ENCOUNTER — Encounter (HOSPITAL_BASED_OUTPATIENT_CLINIC_OR_DEPARTMENT_OTHER): Payer: Self-pay | Admitting: Cardiovascular Disease

## 2023-10-15 ENCOUNTER — Ambulatory Visit (HOSPITAL_BASED_OUTPATIENT_CLINIC_OR_DEPARTMENT_OTHER): Payer: Medicare (Managed Care) | Admitting: Cardiovascular Disease

## 2023-10-15 VITALS — BP 122/70 | HR 81 | Ht 63.0 in | Wt 209.0 lb

## 2023-10-15 DIAGNOSIS — E785 Hyperlipidemia, unspecified: Secondary | ICD-10-CM | POA: Diagnosis not present

## 2023-10-15 DIAGNOSIS — I10 Essential (primary) hypertension: Secondary | ICD-10-CM

## 2023-10-15 NOTE — Progress Notes (Signed)
 Cardiology Office Note:  .   Date:  10/15/2023  ID:  Ashley Lewis, DOB 02/25/1966, MRN [id number] PCP: Knute Thersia Bitters, FNP  [facility] Providers Cardiologist:  Annabella Scarce, MD     History of Present Illness: .   Ashley Lewis is a 58 y.o. female with hypertension, prior tobacco abuse, prior stroke, and THC use who presents for follow-up.  She was initially seen 05/2015 for an evaluation of chest pain.  Ms. Sirianni reported R sided chest pain and shoulder pain that had been ongoing for months.  The pain was felt to be atypical no ischemia evaluation was performed at that time.  She is physically active by walking her dog regularly and had no chest pain or shortness of breath with that activity.  Her chest pain was thought to be non-ischemic.  She was hypotensive and orthostatic.  Losartan  was discontinued.     At her visit 4/21 she continued to have chest pain that was not ischemic.  She is not interested in smoking cessation at that time.  Lipids were elevated and recommended increasing her atorvastatin .  She was seen in the ED 11/2020 with chest pain.  Blood pressure was 169/89.  She left without being seen.   At her visit 8/23 she continued to report exertional dispnea and atypical chest pain. She had a Coronary CTA  with a calcium  score 0 and no CAD.  Seen by Reche Finder NP 9/23 and was doing well.   Discussed the use of AI scribe software for clinical note transcription with the patient, who gave verbal consent to proceed.  History of Present Illness Ashley Lewis experiences increased sweating during physical activity, such as working in the yard, but has no chest pain or pressure during these episodes. She has been more physically active recently, which has helped alleviate her back pain.  She continues to experience wheezing when lying down at night, a symptom present for several years. The wheezing is absent during the day or when she is upright. She has a  history of acid reflux and is currently treated with omeprazole , which has reduced the intensity of the wheezing but not eliminated it. There is no wheezing during physical activity or daytime.  Her blood pressure remains stable on amlodipine  and valsartan . She takes Repatha  for hyperlipidemia, with her last LDL recorded at 98 in March.    ROS:  As per HPI  Studies Reviewed: .         Risk Assessment/Calculations:             Physical Exam:   VS:  BP 122/70 (BP Location: Left Arm, Patient Position: Sitting, Cuff Size: Normal)   Pulse 81   Ht 5' 3 (1.6 m)   Wt 209 lb (94.8 kg)   SpO2 97%   BMI 37.02 kg/m  , BMI Body mass index is 37.02 kg/m. GENERAL:  Well appearing HEENT: Pupils equal round and reactive, fundi not visualized, oral mucosa unremarkable NECK:  No jugular venous distention, waveform within normal limits, carotid upstroke brisk and symmetric, no bruits, no thyromegalyLUNGS:  Clear to auscultation bilaterally HEART:  RRR.  PMI not displaced or sustained,S1 and S2 within normal limits, no S3, no S4, no clicks, no rubs, no murmurs ABD:  Flat, positive bowel sounds normal in frequency in pitch, no bruits, no rebound, no guarding, no midline pulsatile mass, no hepatomegaly, no splenomegaly EXT:  2 plus pulses throughout, no edema, no cyanosis no clubbing SKIN:  No  rashes no nodules NEURO:  Cranial nerves II through XII grossly intact, motor grossly intact throughout PSYCH:  Cognitively intact, oriented to person place and time   ASSESSMENT AND PLAN: .    Assessment & Plan # Wheezing when lying down Wheezing occurs only when lying down, likely due to acid reflux or sinus drainage. Cardiac causes are unlikely. - Continue omeprazole  for GERD.  # Gastroesophageal reflux disease (GERD) GERD managed with omeprazole , possibly linked to wheezing symptoms. - Continue omeprazole .  # Hyperlipidemia LDL cholesterol at 98, above target. Diet includes fried foods and red  meat, contributing to elevated levels. Repatha  administered biweekly. - Eliminate fried foods and red meat. - Recheck fasting lipid panel and comprehensive metabolic panel end of October. - Continue Repatha  biweekly.  # Hypertension Blood pressure controlled at 122/70 with current medication. - Continue amlodipine  and valsartan .         Dispo: f/u in 1 year  Signed, Annabella Scarce, MD

## 2023-10-15 NOTE — Patient Instructions (Signed)
 Medication Instructions:  Your physician recommends that you continue on your current medications as directed. Please refer to the Current Medication list given to you today.   *If you need a refill on your cardiac medications before your next appointment, please call your pharmacy*  Lab Work: FASTING LP/CMET END OF OCTOBER   If you have labs (blood work) drawn today and your tests are completely normal, you will receive your results only by: MyChart Message (if you have MyChart) OR A paper copy in the mail If you have any lab test that is abnormal or we need to change your treatment, we will call you to review the results.  Testing/Procedures: NONE   Follow-Up: At [facility], you and your health needs are our priority.  As part of our continuing mission to provide you with exceptional heart care, our providers are all part of one team.  This team includes your primary Cardiologist (physician) and Advanced Practice Providers or APPs (Physician Assistants and Nurse Practitioners) who all work together to provide you with the care you need, when you need it.  Your next appointment:   12 month(s)  Provider:   Annabella Scarce, MD, Rosaline Bane, NP, or Reche Finder, NP    We recommend signing up for the patient portal called MyChart.  Sign up information is provided on this After Visit Summary.  MyChart is used to connect with patients for Virtual Visits (Telemedicine).  Patients are able to view lab/test results, encounter notes, upcoming appointments, etc.  Non-urgent messages can be sent to your provider as well.   To learn more about what you can do with MyChart, go to [email or web address].   Other Instructions DECREASE FRIED/GREASY FOODS, CHEESES, FATTY FOOD

## 2023-11-04 DIAGNOSIS — Z1231 Encounter for screening mammogram for malignant neoplasm of breast: Secondary | ICD-10-CM | POA: Diagnosis not present

## 2023-11-04 LAB — HM MAMMOGRAPHY

## 2023-11-11 ENCOUNTER — Encounter: Payer: Self-pay | Admitting: Family Medicine

## 2023-11-13 ENCOUNTER — Telehealth: Payer: Self-pay | Admitting: Family Medicine

## 2023-11-13 NOTE — Telephone Encounter (Signed)
 Called patient and left VM about referral.

## 2023-12-06 ENCOUNTER — Other Ambulatory Visit (HOSPITAL_BASED_OUTPATIENT_CLINIC_OR_DEPARTMENT_OTHER): Payer: Self-pay | Admitting: Family Medicine

## 2023-12-11 ENCOUNTER — Ambulatory Visit (HOSPITAL_BASED_OUTPATIENT_CLINIC_OR_DEPARTMENT_OTHER): Payer: Self-pay | Admitting: Family Medicine

## 2023-12-11 ENCOUNTER — Ambulatory Visit (HOSPITAL_BASED_OUTPATIENT_CLINIC_OR_DEPARTMENT_OTHER): Payer: Medicare (Managed Care) | Admitting: Family Medicine

## 2023-12-11 VITALS — BP 134/73 | HR 78 | Ht 63.0 in | Wt 207.7 lb

## 2023-12-11 DIAGNOSIS — E669 Obesity, unspecified: Secondary | ICD-10-CM

## 2023-12-11 DIAGNOSIS — R7303 Prediabetes: Secondary | ICD-10-CM | POA: Diagnosis not present

## 2023-12-11 DIAGNOSIS — N3001 Acute cystitis with hematuria: Secondary | ICD-10-CM | POA: Diagnosis not present

## 2023-12-11 DIAGNOSIS — E785 Hyperlipidemia, unspecified: Secondary | ICD-10-CM

## 2023-12-11 DIAGNOSIS — R102 Pelvic and perineal pain: Secondary | ICD-10-CM

## 2023-12-11 DIAGNOSIS — N3090 Cystitis, unspecified without hematuria: Secondary | ICD-10-CM | POA: Diagnosis not present

## 2023-12-11 LAB — POCT URINALYSIS DIP (CLINITEK)
Blood, UA: NEGATIVE
Glucose, UA: 100 mg/dL — AB
Nitrite, UA: POSITIVE — AB
POC PROTEIN,UA: 100 — AB
Spec Grav, UA: 1.01 (ref 1.010–1.025)
Urobilinogen, UA: 4 U/dL — AB
pH, UA: 5 (ref 5.0–8.0)

## 2023-12-11 LAB — POCT GLYCOSYLATED HEMOGLOBIN (HGB A1C)
HbA1c POC (<> result, manual entry): 5.5 % (ref 4.0–5.6)
Hemoglobin A1C: 5.5 % (ref 4.0–5.6)

## 2023-12-11 MED ORDER — NITROFURANTOIN MONOHYD MACRO 100 MG PO CAPS
100.0000 mg | ORAL_CAPSULE | Freq: Two times a day (BID) | ORAL | 0 refills | Status: DC
Start: 2023-12-11 — End: 2023-12-11

## 2023-12-11 MED ORDER — NITROFURANTOIN MONOHYD MACRO 100 MG PO CAPS: 100.0000 mg | ORAL_CAPSULE | Freq: Two times a day (BID) | ORAL | 0 refills | Status: DC

## 2023-12-11 NOTE — Progress Notes (Signed)
 Hi Cayli,  Your urine shows an active urinary tract infection. Instead of the daily antibiotic, I have sent in macrobid  100mg  to take 1 tablet twice daily for 5 days. Following this, let me know when symptoms return/if they return and I will send in the daily antibiotic as discussed. I have sent your urine off for culture as well and will change the antibiotic if there is resistance present.

## 2023-12-11 NOTE — Patient Instructions (Addendum)
 Probiotic supplement- [facility], [facility] [place] , [address] , [address]

## 2023-12-11 NOTE — Progress Notes (Signed)
 Subjective:   Ashley Lewis 09/12/1965 12/11/2023  Chief Complaint  Patient presents with   Medical Management of Chronic Issues    24-month follow up; states she is having some of the same issues she has had in the past with pelvic pain and bladder.    HPI: Ashley Lewis presents today for re-assessment and management of chronic medical conditions.   RECURRENT CYSTITIS:  Patient states she is having recurring UTI symptoms.  She has tried to increase her clear fluid intake and using Liquid IV. She has noticed improvement in symptoms when hydrated. She states she is having some mild dysuria intermittently and frequency. She is requesting referral to Urology pending urinalysis. She reports frequent UTI's in the past 2-3 years. Hx of renal stone in 1980's. She was treated for UTI with keflex  in April 2025 with urology referral placed. Hx of tobacco use.   She has an upcoming appt with OBGYN for uterine fibroid management.    HYPERLIPIDEMIA: [name] presents for the medical management of hyperlipidemia. Recently seen by Dr. Raford with Cardiology. She is not currently at goal with Ldl above target at 98. Recommended to make dietary changes.  Patient's current HLD regimen is: Repatha  Q14 day injection Patient is  currently taking prescribed medications for HLD.  Denies myalgias.  Lab Results  Component Value Date   CHOL 168 06/04/2023   HDL 50 06/04/2023   LDLCALC 98 06/04/2023   TRIG 108 06/04/2023   CHOLHDL 3.4 06/04/2023      The following portions of the patient's history were reviewed and updated as appropriate: past medical history, past surgical history, family history, social history, allergies, medications, and problem list.   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 Cvejin   Eczema    Exertional dyspnea 10/29/2021   GERD (gastroesophageal reflux disease)    Hematuria 03/12/2022   Hyperlipidemia    Hypertension    Psoriasis    Stroke (HCC) 2011   left thalamic infarct   Past Surgical History:  Procedure Laterality Date   CERVICAL CERCLAGE     x 2, in 1995, 1997   CHOLECYSTECTOMY N/A 02/03/2015   Procedure: LAPAROSCOPIC CHOLECYSTECTOMY;  Surgeon: Herlene Righter Kinsinger, MD;  Location: [facility] OR;  Service: General;  Laterality: N/A;   Family History  Problem Relation Age of Onset   Cancer Mother 62       breast cancer x 2   Cancer Father 53  pancreatic   Cancer Maternal Grandmother    Heart failure Maternal Grandfather    Outpatient Medications Prior to Visit  Medication Sig Dispense Refill   adalimumab  (HUMIRA , 2 PEN,) 40 MG/0.4ML pen Inject 0.4 mLs (40 mg total) into the skin every 14 (fourteen) days. 2 each 5   amLODipine  (NORVASC ) 5 MG tablet TAKE 1 TABLET BY MOUTH DAILY. 90 tablet 1   aspirin  EC 81 MG tablet Take 81 mg by mouth daily.     Docusate Sodium (DULCOLAX STOOL SOFTENER PO) Take by mouth.     Evolocumab  (REPATHA  SURECLICK) 140 MG/ML SOAJ INJECT  140 MG INTO THE SKIN EVERY 14 (FOURTEEN) DAYS. 6 mL 3   Fluocinolone Acetonide Scalp 0.01 % OIL Apply topically.     ketoconazole  (NIZORAL ) 2 % shampoo Apply 1 Application topically 2 (two) times a week. Wash scalp 1 time weekly, let sit 5 minutes and rinse out, wash face and neck 2 times weekly, let sit 5 minutes and rinse out 120 mL 2   Niacinamide  POWD 1 Application by Does not apply route at bedtime. Apply to dark spots on face nightly for 3 months then d/c for 3 months 30 g 3   omeprazole  (PRILOSEC) 40 MG capsule Take 40 mg by mouth daily.     valsartan  (DIOVAN ) 80 MG tablet TAKE 1 TABLET BY MOUTH EVERY DAY 90 tablet 1   triamcinolone  ointment (KENALOG ) 0.1 % Apply topically 2 (two) times daily.     No facility-administered medications prior to visit.   Allergies  Allergen Reactions   Lisinopril  Cough and Other (See Comments)    Other Reaction(s): Cough (ALLERGY/intolerance)  Drip in her throat, coughing, and couldn't breathe   Risankizumab Rash, Swelling and Anaphylaxis     ROS: A complete ROS was performed with pertinent positives/negatives noted in the HPI. The remainder of the ROS are negative.    Objective:   Today's Vitals   12/11/23 1343  BP: 134/73  Pulse: 78  SpO2: 97%  Weight: 207 lb 11.2 oz (94.2 kg)  Height: 5' 3 (1.6 m)    Physical Exam   GENERAL: Well-appearing, in NAD. Well nourished.  SKIN: Pink, warm and dry. No rash, lesion, ulceration, or ecchymoses.  Head: Normocephalic. NECK: Trachea midline. Full ROM w/o pain or tenderness. No lymphadenopathy.  EARS: Tympanic membranes are intact, translucent without bulging and without drainage. Appropriate landmarks visualized.  EYES: Conjunctiva clear without exudates. EOMI, PERRL, no drainage present.  NOSE: Septum midline w/o deformity. Nares patent, mucosa pink and non-inflamed w/o drainage. No sinus tenderness.  THROAT: Uvula midline. Oropharynx clear. Tonsils non-inflamed without exudate. Mucous membranes  pink and moist.  RESPIRATORY: Chest wall symmetrical. Respirations even and non-labored. Breath sounds clear to auscultation bilaterally.  CARDIAC: S1, S2 present, regular rate and rhythm without murmur or gallops. Peripheral pulses 2+ bilaterally.  MSK: Muscle tone and strength appropriate for age. Joints w/o tenderness, redness, or swelling.  EXTREMITIES: Without clubbing, cyanosis, or edema.  NEUROLOGIC: No motor or sensory deficits. Steady, even gait. C2-C12 intact.  PSYCH/MENTAL STATUS: Alert, oriented x 3. Cooperative, appropriate mood and affect.   Health Maintenance Due  Topic Date Due   COVID-19 Vaccine (1) Never done   Zoster Vaccines- Shingrix (1 of 2) Never done   Pneumococcal Vaccine: 50+ Years (1 of 1 - PCV) Never done   Influenza Vaccine  Never done    Results for orders placed or performed in visit on 12/11/23  POCT glycosylated hemoglobin (Hb A1C)  Result Value  Ref Range   Hemoglobin A1C 5.5 4.0 - 5.6 %   HbA1c POC (<> result, manual entry) 5.5 4.0 - 5.6 %   HbA1c, POC (prediabetic range)     HbA1c, POC (controlled diabetic range)    POCT URINALYSIS DIP (CLINITEK)  Result Value Ref Range   Color, UA orange (A) yellow   Clarity, UA clear clear   Glucose, UA =100 (A) negative mg/dL   Bilirubin, UA small (A) negative   Ketones, POC UA trace (5) (A) negative mg/dL   Spec Grav, UA 8.989 8.989 - 1.025   Blood, UA negative negative   pH, UA 5.0 5.0 - 8.0   POC PROTEIN,UA =100 (A) negative, trace   Urobilinogen, UA 4.0 (A) 0.2 or 1.0 E.U./dL   Nitrite, UA Positive (A) Negative   Leukocytes, UA Large (3+) (A) Negative    The ASCVD Risk score (Arnett DK, et al., 2019) failed to calculate for the following reasons:   Risk score cannot be calculated because patient has a medical history suggesting prior/existing ASCVD     Assessment & Plan:  1. Prediabetes (Primary) Patient has made effort to improve A1C with lifestyle changes and has decreased from 5.7 to 5.5.  Patient encouraged to continue diet and exercise.  - POCT glycosylated hemoglobin (Hb A1C)  2. Obesity (BMI 30-39.9) Discussed good dietary changes, heart healthy and low carb diet and regular exercise.   3. Hyperlipidemia, unspecified hyperlipidemia type Not currently at goal. Will continue lifestyle changes and follow up with Cardiology for LP.   4. Recurrent cystitis  5. Acute cystitis with hematuria Nitrite, blood and leuk present on urine today. Will send for culture and will treat with Macrobid  and send for culture. Discussed possible medications for prevention. Patient would prefer evaluation by Urology first prior to trying preventative.  - Ambulatory referral to Urology - POCT URINALYSIS DIP (CLINITEK) - Urine Culture   Meds ordered this encounter  Medications   DISCONTD: nitrofurantoin , macrocrystal-monohydrate, (MACROBID ) 100 MG capsule    Sig: Take 1 capsule (100 mg total) by mouth 2 (two) times daily.    Dispense:  10 capsule    Refill:  0    Supervising Provider:   DE PERU, RAYMOND J [[id number]]   nitrofurantoin , macrocrystal-monohydrate, (MACROBID ) 100 MG capsule    Sig: Take 1 capsule (100 mg total) by mouth 2 (two) times daily.    Dispense:  10 capsule    Refill:  0    Supervising Provider:   DE PERU, RAYMOND J [id number]   Lab Orders         Urine Culture         POCT glycosylated hemoglobin (Hb A1C)         POCT URINALYSIS DIP (CLINITEK)     No images are attached to the encounter or orders placed in the encounter.  Return in about 6 months (around 06/09/2024) for [facility] PHYSICAL, follow up IFG, HTN, HLD.    Patient to reach out to office if new, worrisome, or unresolved symptoms arise or if no improvement in patient's condition. Patient verbalized understanding and is agreeable to treatment plan. All questions answered to patient's satisfaction.    Thersia Schuyler Stark, [place]

## 2023-12-13 LAB — URINE CULTURE

## 2023-12-29 DIAGNOSIS — I10 Essential (primary) hypertension: Secondary | ICD-10-CM | POA: Diagnosis not present

## 2023-12-29 DIAGNOSIS — H43393 Other vitreous opacities, bilateral: Secondary | ICD-10-CM | POA: Diagnosis not present

## 2023-12-29 DIAGNOSIS — H2513 Age-related nuclear cataract, bilateral: Secondary | ICD-10-CM | POA: Diagnosis not present

## 2023-12-30 NOTE — Progress Notes (Signed)
 Chief Complaint: Recurrent urinary tract infections, abdominal pain  History of Present Illness:  Ashley Lewis is a 58 y.o. female who is seen in consultation from Knute Thersia Bitters, FNP for evaluation of left lower quadrant and suprapubic pain as well as history of frequency.  She has known fibroid disease.  She states that over the past 3 years she has had, every 6 months or so, episodes of abdominal pain.  No noted hematuria, but when symptoms do come on she has some increased urinary frequency and dysuria.  Urinalysis is clear at that time, however.  She had a normal colonoscopy in the past year or so.  CT scan did show diverticulosis.   Past Medical History:  Past Medical History:  Diagnosis Date   Amenorrhea 08/12/2012   Anemia    iron defi   Atypical chest pain 08/05/2019   Depression    sees Dr. Cvejin   Eczema    Exertional dyspnea 10/29/2021   GERD (gastroesophageal reflux disease)    Hematuria 03/12/2022   Hyperlipidemia    Hypertension    Psoriasis    Stroke [facility]) 2011   left thalamic infarct    Past Surgical History:  Past Surgical History:  Procedure Laterality Date   CERVICAL CERCLAGE     x 2, in 1995, 1997   CHOLECYSTECTOMY N/A 02/03/2015   Procedure: LAPAROSCOPIC CHOLECYSTECTOMY;  Surgeon: Herlene Beverley Bureau, MD;  Location: [facility] OR;  Service: General;  Laterality: N/A;    Allergies:  Allergies  Allergen Reactions   Lisinopril  Cough and Other (See Comments)    Other Reaction(s): Cough (ALLERGY/intolerance)  Drip in her throat, coughing, and couldn't breathe   Risankizumab Rash, Swelling and Anaphylaxis    Family History:  Family History  Problem Relation Age of Onset   Cancer Mother 39       breast cancer x 2   Cancer Father 76       pancreatic   Cancer Maternal Grandmother    Heart failure Maternal Grandfather     Social History:  Social History   Tobacco Use   Smoking status: Former    Current packs/day: 0.00     Average packs/day: 0.5 packs/day for 1.1 years (0.5 ttl pk-yrs)    Types: Cigarettes    Start date: 03/25/2009    Quit date: 04/14/2010    Years since quitting: 13.7    Passive exposure: Past   Smokeless tobacco: Never  Vaping Use   Vaping status: Former  Substance Use Topics   Alcohol use: No   Drug use: Not Currently    Types: Marijuana    Review of symptoms:  Constitutional:  Negative for unexplained weight loss, night sweats, fever, chills ENT:  Negative for nose bleeds, sinus pain, painful swallowing CV:  Negative for chest pain, shortness of breath, exercise intolerance, palpitations, loss of consciousness Resp:  Negative for cough, wheezing, shortness of breath GI:  Negative for nausea, vomiting, diarrhea, bloody stools GU:  Positives noted in HPI; otherwise negative for gross hematuria, dysuria, urinary incontinence Neuro:  Negative for seizures, poor balance, limb weakness, slurred speech Psych:  Negative for lack of energy, depression, anxiety Endocrine:  Negative for polydipsia, polyuria, symptoms of hypoglycemia (dizziness, hunger, sweating) Hematologic:  Negative for anemia, purpura, petechia, prolonged or excessive bleeding, use of anticoagulants  Allergic:  Negative for difficulty breathing or choking as a result of exposure to anything; no shellfish allergy; no allergic response (rash/itch) to materials, foods  Physical exam: There were  no vitals taken for this visit. GENERAL APPEARANCE:  Well appearing, well developed, well nourished, NAD HEENT: Atraumatic, Normocephalic. NECK: Normal appearance LUNGS: Normal inspiratory and expiratory excursion HEART: Regular Rate ABDOMEN: Obese.  Mild/moderate left lower quadrant tenderness, no suprapubic tenderness GU: Normal external genitalia.  Bladder well supported.  Nontender.  Mild right pelvic wall tenderness.  No left-sided pelvic wall tenderness.  Enlarged uterus. EXTREMITIES: Moves all extremities well.  Without  clubbing, cyanosis, or edema. NEUROLOGIC:  Alert and oriented x 3, normal gait, CN II-XII grossly intact.  MENTAL STATUS:  Appropriate. SKIN:  Warm, dry and intact.    Results:   I have reviewed referring/prior physicians records  I have reviewed urinalysis--clear today  I have reviewed prior urine cultures  I reviewed prior imaging studies--CT scan from 2024 reviewed  Assessment: Left lower quadrant abdominal pain,  I do not think this is urologic in nature  Urinary frequency, urgency, dysuria when pain presents.  Perhaps this is caused by an extravesical source i.e. possible mild diverticulitis as this is very episodic, only every 6 months or so   Plan: I did let the patient know that there are some bladder irritants, notably caffeine and carbonated beverages, that might increase frequency and urgency  I also let her know that I do not think, especially with a nontender bladder today with her having her abdominal pain, that she likely has an extra urologic source of the symptoms  I will have her come back as needed

## 2023-12-31 ENCOUNTER — Ambulatory Visit: Payer: Medicare (Managed Care) | Admitting: Urology

## 2023-12-31 VITALS — BP 147/91 | HR 83 | Ht 63.0 in | Wt 203.0 lb

## 2023-12-31 DIAGNOSIS — N390 Urinary tract infection, site not specified: Secondary | ICD-10-CM | POA: Diagnosis not present

## 2023-12-31 DIAGNOSIS — Z87440 Personal history of urinary (tract) infections: Secondary | ICD-10-CM

## 2023-12-31 DIAGNOSIS — R1030 Lower abdominal pain, unspecified: Secondary | ICD-10-CM

## 2023-12-31 DIAGNOSIS — R1032 Left lower quadrant pain: Secondary | ICD-10-CM

## 2023-12-31 LAB — URINALYSIS, ROUTINE W REFLEX MICROSCOPIC
Bilirubin, UA: NEGATIVE
Glucose, UA: NEGATIVE
Ketones, UA: NEGATIVE
Leukocytes,UA: NEGATIVE
Nitrite, UA: NEGATIVE
Protein,UA: NEGATIVE
RBC, UA: NEGATIVE
Specific Gravity, UA: 1.02 (ref 1.005–1.030)
Urobilinogen, Ur: 0.2 mg/dL (ref 0.2–1.0)
pH, UA: 5.5 (ref 5.0–7.5)

## 2024-01-05 ENCOUNTER — Other Ambulatory Visit: Payer: Self-pay

## 2024-01-05 ENCOUNTER — Encounter: Payer: Self-pay | Admitting: Obstetrics and Gynecology

## 2024-01-05 ENCOUNTER — Ambulatory Visit: Payer: Medicare (Managed Care) | Admitting: Obstetrics and Gynecology

## 2024-01-05 VITALS — BP 136/82 | HR 85 | Wt 207.5 lb

## 2024-01-05 DIAGNOSIS — G8929 Other chronic pain: Secondary | ICD-10-CM

## 2024-01-05 DIAGNOSIS — N958 Other specified menopausal and perimenopausal disorders: Secondary | ICD-10-CM

## 2024-01-05 DIAGNOSIS — R102 Pelvic and perineal pain unspecified side: Secondary | ICD-10-CM | POA: Diagnosis not present

## 2024-01-05 DIAGNOSIS — Z1331 Encounter for screening for depression: Secondary | ICD-10-CM

## 2024-01-05 MED ORDER — ESTRADIOL 0.01 % VA CREA
TOPICAL_CREAM | VAGINAL | 12 refills | Status: AC
Start: 2024-01-05 — End: ?

## 2024-01-05 NOTE — Progress Notes (Signed)
 NEW GYNECOLOGY PATIENT Patient name: Ashley Lewis MRN [id number]  Date of birth: January 31, 1966 Chief Complaint:   Gynecologic Exam  History:  Presents with pain. Constant sensation like she feels like she has a UTI but testing has been negative.  Went to the urologist and he went inside and touched bladder and didn't feel abnormal. Has been dealing with this for about 3 years. Urologist mentioned something about intestine being close to the area maybe secondary to intestinal inflammation. Wondering why the azo provides relief. Previously q6 months but now more than q 6 months. States pain in tailbone and other LLQ pain. Not sure if there is vaginal dryness. Not sexually active. Last pap smear about 2 years ago with Dr. Rutherford and was normal. No vaginal estrogen since menopasue. Mother had breast cancer.  Menopause since 2013; fibroids since 1985 and no problems an doesn't think it could be due to her fibroids as they shouldn't be growing since she is in menopause. Felt a lot of pain when having to hold her urine prior to the ultrasound and felt a lot better after the US .       Gynecologic History No LMP recorded. Patient is postmenopausal. Contraception: post menopausal status   OB History  No obstetric history on file.     The following portions of the patient's history were reviewed and updated as appropriate: allergies, current medications, past family history, past medical history, past social history, past surgical history and problem list. Health Maintenance  Topic Date Due   COVID-19 Vaccine (1) Never done   Zoster (Shingles) Vaccine (1 of 2) Never done   Pneumococcal Vaccine for age over 79 (1 of 1 - PCV) Never done   Flu Shot  Never done   Medicare Annual Wellness Visit  08/18/2024   Breast Cancer Screening  11/03/2024   Pap with HPV screening  08/23/2026   DTaP/Tdap/Td vaccine (3 - Td or Tdap) 09/29/2030   Colon Cancer Screening  06/12/2032   Hepatitis C Screening   Completed   HIV Screening  Completed   HPV Vaccine  Aged Out   Meningitis B Vaccine  Aged Out   Hepatitis B Vaccine  Discontinued     Review of Systems Pertinent items noted in HPI and remainder of comprehensive ROS otherwise negative.  Physical Exam:  BP (!) 140/78   Pulse 86   Wt 207 lb 8 oz (94.1 kg)   BMI 36.76 kg/m  Physical Exam Vitals and nursing note reviewed. Exam conducted with a chaperone present.  Constitutional:      Appearance: Normal appearance.  Pulmonary:     Effort: Pulmonary effort is normal.  Abdominal:     Palpations: Abdomen is soft.      Comments: equivocal Carnett in left suprapubic area No rebound Slight tenderness in right suprapubic  Genitourinary:    General: Normal vulva.     Exam position: Lithotomy position.     Comments: No allodynia Pallor surrounding urethra Nontender pelvic floor  Neurological:     Mental Status: She is alert.        Assessment and Plan:   1. Genitourinary syndrome of menopause (Primary) 2. Chronic pelvic pain in female Pallor surrounding urethra consistent with GSM and possibly causing sensation of UTI symptoms with negative UA. Recommend trial of vaginal estrogen cream with application of estrogen to urethral meatus. Agree that in postmenopausal state, fibroids should be inert and that based on prior imaging, fibroids appear to have shrunk, which is appropriate.  Noted that vaginal estrogen takes about 2-3 months to see improvement, will follow up in that time frame to reassess symptoms. Will also be following up with GI provider to rule out intestinal source that is irritating bladder and causing bladder symptoms.    - estradiol (ESTRACE) 0.01 % CREA vaginal cream; Apply 1 gram per vagina every night for 2 weeks, then apply three times a week  Dispense: 42.5 g; Refill: 12     Carter Quarry, MD Obstetrician & Gynecologist, [facility] [employer], [facility]

## 2024-01-06 ENCOUNTER — Encounter: Payer: Self-pay | Admitting: Dermatology

## 2024-01-06 ENCOUNTER — Ambulatory Visit: Payer: Medicare (Managed Care) | Admitting: Dermatology

## 2024-01-06 DIAGNOSIS — Z79899 Other long term (current) drug therapy: Secondary | ICD-10-CM | POA: Diagnosis not present

## 2024-01-06 DIAGNOSIS — L4050 Arthropathic psoriasis, unspecified: Secondary | ICD-10-CM

## 2024-01-06 DIAGNOSIS — Z7189 Other specified counseling: Secondary | ICD-10-CM

## 2024-01-06 DIAGNOSIS — L409 Psoriasis, unspecified: Secondary | ICD-10-CM

## 2024-01-06 DIAGNOSIS — L821 Other seborrheic keratosis: Secondary | ICD-10-CM | POA: Diagnosis not present

## 2024-01-06 MED ORDER — HUMIRA (2 PEN) 40 MG/0.4ML ~~LOC~~ AJKT: 40.0000 mg | AUTO-INJECTOR | SUBCUTANEOUS | 5 refills | Status: AC

## 2024-01-06 NOTE — Progress Notes (Unsigned)
 Follow-Up Visit   Subjective  Ashley Lewis is a 58 y.o. female who presents for the following: 6 months f/u on psoriasis, taking Humira  40mg  sq injections q 2 wks, no active psoriasis.   Patient report rash she think was related to her hair products she uses, but could be psoriasis, using ketoconazole  shampoo much improved.    The following portions of the chart were reviewed this encounter and updated as appropriate: medications, allergies, medical history  Review of Systems:  No other skin or systemic complaints except as noted in HPI or Assessment and Plan.  Objective  Well appearing patient in no apparent distress; mood and affect are within normal limits.  A focused examination was performed of the following areas: Face,scalp, neck, arms, legs   Relevant exam findings are noted in the Assessment and Plan.    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   Exam:  1% BSA on treatment. Started Humira  06/2022 Chronic and persistent condition with duration or expected duration over one year. Condition is improving with treatment but not currently at goal. Patient with history of joint pain but Humira  has helped.    Psoriasis - severe on systemic treatment.  Psoriasis is a chronic non-curable, but treatable genetic/hereditary disease that may have other systemic features affecting other organ systems such as joints (Psoriatic Arthritis).  It is linked with heart disease, inflammatory bowel disease, non-alcoholic fatty liver disease, and depression. Significant skin psoriasis and/or psoriatic arthritis may have significant symptoms and affects activities of daily activity and often benefits from systemic treatments.  These systemic treatments have some potential side effects including immunosuppression and require pre-treatment laboratory screening and periodic laboratory  monitoring and periodic in person evaluation and monitoring by the attending dermatologist physician (long term medication management).    Reviewed risks of biologics including immunosuppression, infections, injection site reaction, and failure to improve condition. Goal is control of skin condition, not cure.  Some older biologics such as Humira  and Enbrel may slightly increase risk of malignancy and may worsen congestive heart failure.  Taltz and Cosentyx may cause inflammatory bowel disease to flare. The use of biologics requires long term medication management, including periodic office visits and monitoring of blood work.    Treatment Plan: Labs reviewed  09/11/2023 okay  Labs reviewed 12/11/2023 okay   Continue Humira  40mg  sq injections q 2 weeks Continue Ketoconazole  shampoo apply three times per week, massage into scalp and leave in for 10 minutes before rinsing out    Long term medication management.  Patient is using long term (months to years) prescription medication  to control their dermatologic condition.  These medications require periodic monitoring to evaluate for efficacy and side effects and may require periodic laboratory monitoring.     Will order labs at next visit in 6 months    SEBORRHEIC KERATOSIS - Stuck-on, waxy, tan-brown papules and/or plaques  - Benign-appearing - Discussed benign etiology and prognosis. - Observe - Call for any changes    PSORIASIS   PSORIATIC ARTHRITIS (HCC)   COUNSELING AND COORDINATION OF CARE   MEDICATION MANAGEMENT   LONG-TERM USE OF HIGH-RISK MEDICATION   SEBORRHEIC KERATOSIS    Return in about 6 months (around 07/06/2024) for Psoriasis .  LILLETTE Fay Kirks, CMA, am acting as scribe for Alm Rhyme, MD .   Documentation: I have reviewed the above documentation for accuracy and completeness, and I  agree with the above.  Alm Rhyme, MD

## 2024-01-06 NOTE — Patient Instructions (Signed)

## 2024-01-23 ENCOUNTER — Other Ambulatory Visit (HOSPITAL_BASED_OUTPATIENT_CLINIC_OR_DEPARTMENT_OTHER): Payer: Self-pay | Admitting: Cardiovascular Disease

## 2024-01-23 DIAGNOSIS — E785 Hyperlipidemia, unspecified: Secondary | ICD-10-CM | POA: Diagnosis not present

## 2024-01-24 LAB — COMPREHENSIVE METABOLIC PANEL WITH GFR
ALT: 23 IU/L (ref 0–32)
AST: 18 IU/L (ref 0–40)
Albumin: 4.3 g/dL (ref 3.8–4.9)
Alkaline Phosphatase: 51 IU/L (ref 49–135)
BUN/Creatinine Ratio: 17 (ref 9–23)
BUN: 12 mg/dL (ref 6–24)
Bilirubin Total: 0.3 mg/dL (ref 0.0–1.2)
CO2: 21 mmol/L (ref 20–29)
Calcium: 8.9 mg/dL (ref 8.7–10.2)
Chloride: 105 mmol/L (ref 96–106)
Creatinine, Ser: 0.69 mg/dL (ref 0.57–1.00)
Globulin, Total: 3.3 g/dL (ref 1.5–4.5)
Glucose: 91 mg/dL (ref 70–99)
Potassium: 4.1 mmol/L (ref 3.5–5.2)
Sodium: 142 mmol/L (ref 134–144)
Total Protein: 7.6 g/dL (ref 6.0–8.5)
eGFR: 101 mL/min/1.73 (ref >59)

## 2024-01-24 LAB — LIPID PANEL
Chol/HDL Ratio: 3.5 ratio (ref 0.0–4.4)
Cholesterol, Total: 151 mg/dL (ref 100–199)
HDL: 43 mg/dL (ref >39)
LDL Chol Calc (NIH): 86 mg/dL (ref 0–99)
Triglycerides: 123 mg/dL (ref 0–149)
VLDL Cholesterol Cal: 22 mg/dL (ref 5–40)

## 2024-02-10 ENCOUNTER — Ambulatory Visit (HOSPITAL_BASED_OUTPATIENT_CLINIC_OR_DEPARTMENT_OTHER): Payer: Self-pay | Admitting: Family

## 2024-02-12 NOTE — Telephone Encounter (Signed)
 open in error

## 2024-03-05 ENCOUNTER — Other Ambulatory Visit (HOSPITAL_COMMUNITY): Payer: Self-pay

## 2024-03-05 ENCOUNTER — Telehealth: Payer: Self-pay | Admitting: Pharmacy Technician

## 2024-03-05 NOTE — Telephone Encounter (Signed)
 Pharmacy Patient Advocate Encounter  Received notification from [employer] that Prior Authorization for repatha  has been APPROVED from 02/04/24 to 03/05/25. Unable to obtain price due to refill too soon rejection, last fill date 01/23/24 next available fill date01/01/26   PA #/Case ID/Reference #: [id number]

## 2024-03-05 NOTE — Telephone Encounter (Signed)
 Pharmacy Patient Advocate Encounter   Received notification from CoverMyMeds that prior authorization for repatha  is required/requested.   Insurance verification completed.   The patient is insured through [employer].   Per test claim: PA required; PA submitted to above mentioned insurance via Latent Key/confirmation #/EOC [id number] Status is pending

## 2024-03-11 ENCOUNTER — Ambulatory Visit: Payer: Medicare (Managed Care) | Admitting: Obstetrics and Gynecology

## 2024-03-11 VITALS — BP 142/79 | HR 79 | Wt 201.0 lb

## 2024-03-11 DIAGNOSIS — G8929 Other chronic pain: Secondary | ICD-10-CM | POA: Diagnosis not present

## 2024-03-11 DIAGNOSIS — N958 Other specified menopausal and perimenopausal disorders: Secondary | ICD-10-CM | POA: Diagnosis not present

## 2024-03-11 DIAGNOSIS — R102 Pelvic and perineal pain unspecified side: Secondary | ICD-10-CM

## 2024-03-11 NOTE — Progress Notes (Signed)
° ° °  GYNECOLOGY VISIT  Patient name: Ashley Lewis MRN [id number]  Date of birth: 01/09/1966 Chief Complaint:   Follow-up   History:  Ashley Lewis here for [facility] follow up. Started using vaginal estrogen - notes not using it frequently, primarily prn. Has some fear around using estrogen due to mother's death being estrogen related but acknowledges that symptoms have improved.    The following portions of the patient's history were reviewed and updated as appropriate: allergies, current medications, past family history, past medical history, past social history, past surgical history and problem list.   Health Maintenance:   Last pap     Component Value Date/Time   DIAGPAP  06/20/2016 0000    NEGATIVE FOR INTRAEPITHELIAL LESIONS OR MALIGNANCY.   DIAGPAP  06/20/2016 0000    A LETTER WAS SENT TO THE PATIENT INFORMING HER OF THE ABOVE RESULTS.   ADEQPAP  06/20/2016 0000    Satisfactory for evaluation  endocervical/transformation zone component PRESENT.    Health Maintenance  Topic Date Due   COVID-19 Vaccine (1) Never done   Zoster (Shingles) Vaccine (1 of 2) Never done   Pneumococcal Vaccine for age over 73 (1 of 1 - PCV) Never done   Flu Shot  Never done   Medicare Annual Wellness Visit  08/18/2024   Breast Cancer Screening  11/03/2024   Pap with HPV screening  08/23/2026   DTaP/Tdap/Td vaccine (3 - Td or Tdap) 09/29/2030   Colon Cancer Screening  06/12/2032   Hepatitis C Screening  Completed   HIV Screening  Completed   HPV Vaccine  Aged Out   Meningitis B Vaccine  Aged Out   Hepatitis B Vaccine  Discontinued      Review of Systems:  Pertinent items are noted in HPI. Comprehensive review of systems was otherwise negative.   Objective:  Physical Exam BP (!) 142/79   Pulse 79   Wt 201 lb (91.2 kg)   BMI 35.61 kg/m    Physical Exam Vitals and nursing note reviewed.  Constitutional:      Appearance: Normal appearance.  HENT:     Head: Normocephalic and  atraumatic.  Pulmonary:     Effort: Pulmonary effort is normal.  Skin:    General: Skin is warm and dry.  Neurological:     General: No focal deficit present.     Mental Status: She is alert.  Psychiatric:        Mood and Affect: Mood normal.        Behavior: Behavior normal.        Thought Content: Thought content normal.        Judgment: Judgment normal.       Assessment & Plan:  1. Genitourinary syndrome of menopause (Primary) 2. Chronic pelvic pain in female Improved with use of vaginal estrogen - recommend more consistent use to maximize benefit. Can also try non-hormonal vaginal solutions to assuage anxiety surrounding hormone use, given samples of uber lube; can use vaginal moisturizer, coconut oil, etc.    Breyson Kelm, MD [facility], [facility]

## 2024-03-12 ENCOUNTER — Telehealth (HOSPITAL_BASED_OUTPATIENT_CLINIC_OR_DEPARTMENT_OTHER): Payer: Self-pay | Admitting: Family Medicine

## 2024-03-12 NOTE — Telephone Encounter (Signed)
 Copied from CRM #[id number]. Topic: Medicare AWV >> Mar 12, 2024  2:24 PM Nathanel DEL wrote: Called LVM 03/12/2024 to sched AWVI. Please schedule AWVI in office only  Nathanel Paschal; Care Guide Ambulatory Clinical Support [place] [facility] Direct Dial: [phone]

## 2024-04-09 ENCOUNTER — Encounter: Payer: Self-pay | Admitting: Dermatology

## 2024-04-09 DIAGNOSIS — L409 Psoriasis, unspecified: Secondary | ICD-10-CM

## 2024-04-10 ENCOUNTER — Other Ambulatory Visit (HOSPITAL_BASED_OUTPATIENT_CLINIC_OR_DEPARTMENT_OTHER): Payer: Self-pay | Admitting: Family Medicine

## 2024-04-12 MED ORDER — FLUOCINOLONE ACETONIDE BODY 0.01 % EX OIL: TOPICAL_OIL | CUTANEOUS | 5 refills | Status: AC

## 2024-04-12 MED ORDER — DOXYCYCLINE MONOHYDRATE 100 MG PO CAPS: 100.0000 mg | ORAL_CAPSULE | Freq: Two times a day (BID) | ORAL | 0 refills | Status: AC

## 2024-04-12 NOTE — Addendum Note (Signed)
 Addended by: TANDA SETTER A on: 04/12/2024 05:07 PM   Modules accepted: Orders

## 2024-06-09 ENCOUNTER — Encounter (HOSPITAL_BASED_OUTPATIENT_CLINIC_OR_DEPARTMENT_OTHER): Payer: Medicare (Managed Care) | Admitting: Family Medicine

## 2024-07-06 ENCOUNTER — Ambulatory Visit: Payer: Medicare (Managed Care) | Admitting: Dermatology
